# Patient Record
Sex: Male | Born: 1950 | Race: White | Hispanic: No | Marital: Married | Smoking: Former smoker
Health system: Southern US, Community
[De-identification: ages and names within clinical notes are randomized; demographics above are authoritative.]

## PROBLEM LIST (undated history)

## (undated) DIAGNOSIS — H53459 Other localized visual field defect, unspecified eye: Secondary | ICD-10-CM

## (undated) DIAGNOSIS — Z951 Presence of aortocoronary bypass graft: Secondary | ICD-10-CM

## (undated) DIAGNOSIS — I255 Ischemic cardiomyopathy: Secondary | ICD-10-CM

## (undated) DIAGNOSIS — I10 Essential (primary) hypertension: Secondary | ICD-10-CM

## (undated) DIAGNOSIS — I213 ST elevation (STEMI) myocardial infarction of unspecified site: Secondary | ICD-10-CM

## (undated) DIAGNOSIS — Z720 Tobacco use: Secondary | ICD-10-CM

## (undated) DIAGNOSIS — E785 Hyperlipidemia, unspecified: Secondary | ICD-10-CM

## (undated) DIAGNOSIS — K219 Gastro-esophageal reflux disease without esophagitis: Secondary | ICD-10-CM

## (undated) DIAGNOSIS — I5020 Unspecified systolic (congestive) heart failure: Secondary | ICD-10-CM

## (undated) DIAGNOSIS — I25110 Atherosclerotic heart disease of native coronary artery with unstable angina pectoris: Secondary | ICD-10-CM

## (undated) HISTORY — PX: ADENOIDECTOMY: SUR15

## (undated) HISTORY — DX: Gastro-esophageal reflux disease without esophagitis: K21.9

## (undated) HISTORY — PX: CARDIAC CATHETERIZATION: SHX172

## (undated) HISTORY — DX: Unspecified systolic (congestive) heart failure: I50.20

## (undated) HISTORY — DX: Other localized visual field defect, unspecified eye: H53.459

## (undated) HISTORY — PX: CATARACT EXTRACTION: SUR2

---

## 2007-10-22 ENCOUNTER — Ambulatory Visit: Payer: Self-pay | Admitting: Internal Medicine

## 2008-03-07 ENCOUNTER — Ambulatory Visit: Payer: Self-pay | Admitting: General Surgery

## 2013-02-26 ENCOUNTER — Encounter: Payer: Self-pay | Admitting: General Surgery

## 2013-02-26 ENCOUNTER — Ambulatory Visit (INDEPENDENT_AMBULATORY_CARE_PROVIDER_SITE_OTHER): Payer: BC Managed Care – PPO | Admitting: General Surgery

## 2013-02-26 VITALS — BP 140/72 | HR 82 | Resp 12 | Ht 72.0 in | Wt 192.0 lb

## 2013-02-26 DIAGNOSIS — Z860100 Personal history of colon polyps, unspecified: Secondary | ICD-10-CM

## 2013-02-26 MED ORDER — POLYETHYLENE GLYCOL 3350 17 GM/SCOOP PO POWD: ORAL | Status: DC

## 2013-02-26 NOTE — Progress Notes (Signed)
Patient ID: [name] [name], male   DOB: 10-27-1950, 62 y.o.   MRN: [id number]  Chief Complaint  Patient presents with  . Other    5 year colonoscopy    HPI [name] [name] is a 62 y.o. male.  Patient here today to discuss having a colonoscopy.  Last one done was 03-07-08. The patient has a history of colon polyps. He denies any problems with his bowels at this time. No known family history of colon problems.   HPI  Past Medical History  Diagnosis Date  . Hypertension   . High cholesterol   . GERD (gastroesophageal reflux disease)     Past Surgical History  Procedure Laterality Date  . Adenoidectomy      History reviewed. No pertinent family history.  Social History History  Substance Use Topics  . Smoking status: Current Every Day Smoker -- 1.00 packs/day    Types: Cigarettes  . Smokeless tobacco: Never Used  . Alcohol Use: Yes     Comment: 4/week    Not on File  Current Outpatient Prescriptions  Medication Sig Dispense Refill  . aspirin 81 MG tablet Take 81 mg by mouth daily.      Marland Kitchen diltiazem (TIAZAC) 420 MG 24 hr capsule Take 420 mg by mouth daily.      . Ergocalciferol (VITAMIN D2) 2000 UNITS TABS Take 1 tablet by mouth daily.      . OMEGA 3 1000 MG CAPS Take 2,000 mg by mouth daily.      . pravastatin (PRAVACHOL) 80 MG tablet Take 80 mg by mouth daily.      . polyethylene glycol powder (GLYCOLAX/MIRALAX) powder 255 grams one bottle for colonoscopy prep  255 g  0   No current facility-administered medications for this visit.    Review of Systems Review of Systems  Constitutional: Negative.   Respiratory: Negative.  Negative for chest tightness and shortness of breath.   Cardiovascular: Negative.  Negative for chest pain, palpitations and leg swelling.  Gastrointestinal: Negative.     Blood pressure 140/72, pulse 82, resp. rate 12, height 6' (1.829 m), weight 192 lb (87.091 kg).  Physical Exam Physical Exam  Constitutional: He is oriented to person, place, and  time. He appears well-developed and well-nourished.  Cardiovascular: Normal rate and regular rhythm.   Murmur heard.  Systolic murmur is present with a grade of 2/6  Pulmonary/Chest: Effort normal and breath sounds normal.  Neurological: He is alert and oriented to person, place, and time.  Skin: Skin is warm and dry.    Data Reviewed Colonoscopy report dated March 07, 2008 showed a single polyp in the rectum. This was tubular adenoma on pathologic review without evidence of high-grade dysplasia. This measured about 5 mm in diameter.  Assessment    Previous colonic polyps. Candidate for screening colonoscopy.     Plan    Procedure was reviewed with the patient. He is amenable to have a repeat exam.     Patient has been scheduled for a colonoscopy on 04-10-13 at [facility]. This patient has been asked to discontinue fish oil one week prior to procedure. It is okay for patient to continue 81 mg aspirin.   Earline Mayotte 02/27/2013, 8:57 PM

## 2013-02-26 NOTE — Patient Instructions (Addendum)
Patient to be scheduled for a colonoscopy.   Colonoscopy A colonoscopy is an exam to evaluate your entire colon. In this exam, your colon is cleansed. A long fiberoptic tube is inserted through your rectum and into your colon. The fiberoptic scope (endoscope) is a long bundle of enclosed and very flexible fibers. These fibers transmit light to the area examined and send images from that area to your caregiver. Discomfort is usually minimal. You may be given a drug to help you sleep (sedative) during or prior to the procedure. This exam helps to detect lumps (tumors), polyps, inflammation, and areas of bleeding. Your caregiver may also take a small piece of tissue (biopsy) that will be examined under a microscope. LET YOUR CAREGIVER KNOW ABOUT:   Allergies to food or medicine.  Medicines taken, including vitamins, herbs, eyedrops, over-the-counter medicines, and creams.  Use of steroids (by mouth or creams).  Previous problems with anesthetics or numbing medicines.  History of bleeding problems or blood clots.  Previous surgery.  Other health problems, including diabetes and kidney problems.  Possibility of pregnancy, if this applies. BEFORE THE PROCEDURE   A clear liquid diet may be required for 2 days before the exam.  Ask your caregiver about changing or stopping your regular medications.  Liquid injections (enemas) or laxatives may be required.  A large amount of electrolyte solution may be given to you to drink over a short period of time. This solution is used to clean out your colon.  You should be present 60 minutes prior to your procedure or as directed by your caregiver. AFTER THE PROCEDURE   If you received a sedative or pain relieving medication, you will need to arrange for someone to drive you home.  Occasionally, there is a little blood passed with the first bowel movement. Do not be concerned. FINDING OUT THE RESULTS OF YOUR TEST Not all test results are available  during your visit. If your test results are not back during the visit, make an appointment with your caregiver to find out the results. Do not assume everything is normal if you have not heard from your caregiver or the medical facility. It is important for you to follow up on all of your test results. HOME CARE INSTRUCTIONS   It is not unusual to pass moderate amounts of gas and experience mild abdominal cramping following the procedure. This is due to air being used to inflate your colon during the exam. Walking or a warm pack on your belly (abdomen) may help.  You may resume all normal meals and activities after sedatives and medicines have worn off.  Only take over-the-counter or prescription medicines for pain, discomfort, or fever as directed by your caregiver. Do not use aspirin or blood thinners if a biopsy was taken. Consult your caregiver for medicine usage if biopsies were taken. SEEK IMMEDIATE MEDICAL CARE IF:   You have a fever.  You pass large blood clots or fill a toilet with blood following the procedure. This may also occur 10 to 14 days following the procedure. This is more likely if a biopsy was taken.  You develop abdominal pain that keeps getting worse and cannot be relieved with medicine. Document Released: 06/24/2000 Document Revised: 09/19/2011 Document Reviewed: 02/07/2008 [facility] Patient Information 2014 [place], [place].  Patient has been scheduled for a colonoscopy on 04-10-13 at [facility]. This patient has been asked to discontinue fish oil one week prior to procedure. It is okay for patient to continue 81 mg aspirin.

## 2013-02-27 ENCOUNTER — Encounter: Payer: Self-pay | Admitting: General Surgery

## 2013-03-24 ENCOUNTER — Other Ambulatory Visit: Payer: Self-pay | Admitting: General Surgery

## 2013-03-24 DIAGNOSIS — Z86010 Personal history of colonic polyps: Secondary | ICD-10-CM

## 2013-04-03 ENCOUNTER — Telehealth: Payer: Self-pay | Admitting: *Deleted

## 2013-04-03 NOTE — Telephone Encounter (Signed)
Patient reports no change in medications since last office visit. He states he has already stopped fish oil. Patient instructed to pre-register since he has not done so already. We will proceed with colonoscopy that is scheduled for 04-10-13 at [facility]. He will call the office if he has further questions.

## 2013-04-10 ENCOUNTER — Ambulatory Visit: Payer: Self-pay | Admitting: General Surgery

## 2013-04-10 DIAGNOSIS — D129 Benign neoplasm of anus and anal canal: Secondary | ICD-10-CM

## 2013-04-10 DIAGNOSIS — D128 Benign neoplasm of rectum: Secondary | ICD-10-CM

## 2013-04-11 ENCOUNTER — Encounter: Payer: Self-pay | Admitting: General Surgery

## 2013-04-11 LAB — PATHOLOGY REPORT

## 2013-04-12 ENCOUNTER — Telehealth: Payer: Self-pay | Admitting: *Deleted

## 2013-04-12 ENCOUNTER — Encounter: Payer: Self-pay | Admitting: General Surgery

## 2013-04-12 NOTE — Telephone Encounter (Signed)
Message copied by Levada Schilling on Fri Apr 12, 2013  9:09 AM ------      Message from: [place], Merrily Pew      Created: Fri Apr 12, 2013  8:54 AM       Notify the patient the polyps removed on Oct 1 were benign, no cancer. He should plan on a f/u colonoscopy in five years.            Put in recalls, or forward to [place]. Thanks. ------

## 2013-04-12 NOTE — Telephone Encounter (Signed)
Notified patient as instructed, patient pleased. Discussed follow-up appointments,put in recalls for five years. patient agrees

## 2014-03-24 ENCOUNTER — Inpatient Hospital Stay: Payer: Self-pay | Admitting: Internal Medicine

## 2014-03-24 LAB — COMPREHENSIVE METABOLIC PANEL
ALBUMIN: 3.6 g/dL (ref 3.4–5.0)
ALT: 27 U/L
ANION GAP: 9 (ref 7–16)
Alkaline Phosphatase: 62 U/L
BILIRUBIN TOTAL: 0.4 mg/dL (ref 0.2–1.0)
BUN: 12 mg/dL (ref 7–18)
CALCIUM: 8.3 mg/dL — AB (ref 8.5–10.1)
CREATININE: 1.3 mg/dL (ref 0.60–1.30)
Chloride: 107 mmol/L (ref 98–107)
Co2: 23 mmol/L (ref 21–32)
EGFR (African American): >60
GFR CALC NON AF AMER: 58 — AB
Glucose: 110 mg/dL — ABNORMAL HIGH (ref 65–99)
OSMOLALITY: 278 (ref 275–301)
POTASSIUM: 4.1 mmol/L (ref 3.5–5.1)
SGOT(AST): 108 U/L — ABNORMAL HIGH (ref 15–37)
Sodium: 139 mmol/L (ref 136–145)
Total Protein: 7.3 g/dL (ref 6.4–8.2)

## 2014-03-24 LAB — TROPONIN I
TROPONIN-I: 34 ng/mL — AB
Troponin-I: 12 ng/mL — ABNORMAL HIGH

## 2014-03-24 LAB — PRO B NATRIURETIC PEPTIDE: B-Type Natriuretic Peptide: 721 pg/mL — ABNORMAL HIGH (ref 0–125)

## 2014-03-24 LAB — URINALYSIS, COMPLETE
Bacteria: NONE SEEN
Bilirubin,UR: NEGATIVE
Blood: NEGATIVE
Glucose,UR: NEGATIVE mg/dL (ref 0–75)
KETONE: NEGATIVE
Leukocyte Esterase: NEGATIVE
Nitrite: NEGATIVE
Ph: 5 (ref 4.5–8.0)
Protein: NEGATIVE
Specific Gravity: 1.003 (ref 1.003–1.030)

## 2014-03-24 LAB — APTT: ACTIVATED PTT: 30.2 s (ref 23.6–35.9)

## 2014-03-24 LAB — CBC
HCT: 44.2 % (ref 40.0–52.0)
HGB: 14.8 g/dL (ref 13.0–18.0)
MCH: 31.1 pg (ref 26.0–34.0)
MCHC: 33.6 g/dL (ref 32.0–36.0)
MCV: 93 fL (ref 80–100)
Platelet: 229 10*3/uL (ref 150–440)
RBC: 4.77 10*6/uL (ref 4.40–5.90)
RDW: 13.4 % (ref 11.5–14.5)
WBC: 10.9 10*3/uL — ABNORMAL HIGH (ref 3.8–10.6)

## 2014-03-24 LAB — CK TOTAL AND CKMB (NOT AT ARMC)
CK, TOTAL: 801 U/L — AB
CK-MB: 69.3 ng/mL — ABNORMAL HIGH (ref 0.5–3.6)

## 2014-03-24 LAB — HEPARIN LEVEL (UNFRACTIONATED): Anti-Xa(Unfractionated): 0.38 IU/mL (ref 0.30–0.70)

## 2014-03-24 LAB — PROTIME-INR
INR: 1.1
PROTHROMBIN TIME: 14.3 s (ref 11.5–14.7)

## 2014-03-24 LAB — CK-MB
CK-MB: 130 ng/mL — ABNORMAL HIGH (ref 0.5–3.6)
CK-MB: 153.2 ng/mL — ABNORMAL HIGH (ref 0.5–3.6)

## 2014-03-25 DIAGNOSIS — I2510 Atherosclerotic heart disease of native coronary artery without angina pectoris: Secondary | ICD-10-CM

## 2014-03-25 DIAGNOSIS — R079 Chest pain, unspecified: Secondary | ICD-10-CM

## 2014-03-25 DIAGNOSIS — I200 Unstable angina: Secondary | ICD-10-CM | POA: Diagnosis not present

## 2014-03-25 DIAGNOSIS — I10 Essential (primary) hypertension: Secondary | ICD-10-CM

## 2014-03-25 DIAGNOSIS — I214 Non-ST elevation (NSTEMI) myocardial infarction: Secondary | ICD-10-CM | POA: Diagnosis not present

## 2014-03-25 LAB — CBC WITH DIFFERENTIAL/PLATELET
Basophil #: 0 10*3/uL (ref 0.0–0.1)
Basophil %: 0.3 %
EOS ABS: 0.1 10*3/uL (ref 0.0–0.7)
Eosinophil %: 0.6 %
HCT: 41.7 % (ref 40.0–52.0)
HGB: 14 g/dL (ref 13.0–18.0)
LYMPHS ABS: 2.2 10*3/uL (ref 1.0–3.6)
Lymphocyte %: 15.7 %
MCH: 31.1 pg (ref 26.0–34.0)
MCHC: 33.6 g/dL (ref 32.0–36.0)
MCV: 93 fL (ref 80–100)
MONO ABS: 1.4 x10 3/mm — AB (ref 0.2–1.0)
MONOS PCT: 10.2 %
NEUTROS PCT: 73.2 %
Neutrophil #: 10 10*3/uL — ABNORMAL HIGH (ref 1.4–6.5)
Platelet: 205 10*3/uL (ref 150–440)
RBC: 4.51 10*6/uL (ref 4.40–5.90)
RDW: 13.6 % (ref 11.5–14.5)
WBC: 13.7 10*3/uL — ABNORMAL HIGH (ref 3.8–10.6)

## 2014-03-25 LAB — BASIC METABOLIC PANEL
Anion Gap: 8 (ref 7–16)
BUN: 10 mg/dL (ref 7–18)
CHLORIDE: 107 mmol/L (ref 98–107)
CO2: 25 mmol/L (ref 21–32)
CREATININE: 0.87 mg/dL (ref 0.60–1.30)
Calcium, Total: 8.4 mg/dL — ABNORMAL LOW (ref 8.5–10.1)
EGFR (African American): >60
EGFR (Non-African Amer.): >60
Glucose: 116 mg/dL — ABNORMAL HIGH (ref 65–99)
OSMOLALITY: 279 (ref 275–301)
POTASSIUM: 4 mmol/L (ref 3.5–5.1)
Sodium: 140 mmol/L (ref 136–145)

## 2014-03-25 LAB — LIPID PANEL
CHOLESTEROL: 131 mg/dL (ref 0–200)
HDL Cholesterol: 42 mg/dL (ref 40–60)
LDL CHOLESTEROL, CALC: 77 mg/dL (ref 0–100)
TRIGLYCERIDES: 61 mg/dL (ref 0–200)
VLDL CHOLESTEROL, CALC: 12 mg/dL (ref 5–40)

## 2014-03-25 LAB — HEMOGLOBIN A1C: Hemoglobin A1C: 5.8 % (ref 4.2–6.3)

## 2014-03-25 LAB — HEPARIN LEVEL (UNFRACTIONATED)
ANTI-XA(UNFRACTIONATED): 0.45 [IU]/mL (ref 0.30–0.70)
Anti-Xa(Unfractionated): 0.2 IU/mL — ABNORMAL LOW (ref 0.30–0.70)

## 2014-03-25 LAB — TSH: Thyroid Stimulating Horm: 1.18 u[IU]/mL

## 2014-03-25 LAB — TROPONIN I: Troponin-I: >40 ng/mL

## 2014-03-26 ENCOUNTER — Other Ambulatory Visit: Payer: Self-pay | Admitting: Nurse Practitioner

## 2014-03-26 ENCOUNTER — Inpatient Hospital Stay (HOSPITAL_COMMUNITY)
Admission: EM | Admit: 2014-03-26 | Discharge: 2014-04-02 | DRG: 236 | Disposition: A | Discharge status: Home / Self Care | Payer: BC Managed Care – PPO | Source: Other Acute Inpatient Hospital | Attending: Thoracic Surgery (Cardiothoracic Vascular Surgery) | Admitting: Thoracic Surgery (Cardiothoracic Vascular Surgery)

## 2014-03-26 ENCOUNTER — Encounter: Payer: Self-pay | Admitting: Nurse Practitioner

## 2014-03-26 DIAGNOSIS — I25110 Atherosclerotic heart disease of native coronary artery with unstable angina pectoris: Secondary | ICD-10-CM | POA: Diagnosis present

## 2014-03-26 DIAGNOSIS — K219 Gastro-esophageal reflux disease without esophagitis: Secondary | ICD-10-CM | POA: Diagnosis present

## 2014-03-26 DIAGNOSIS — I2510 Atherosclerotic heart disease of native coronary artery without angina pectoris: Secondary | ICD-10-CM

## 2014-03-26 DIAGNOSIS — J4489 Other specified chronic obstructive pulmonary disease: Secondary | ICD-10-CM | POA: Diagnosis present

## 2014-03-26 DIAGNOSIS — Z951 Presence of aortocoronary bypass graft: Secondary | ICD-10-CM

## 2014-03-26 DIAGNOSIS — I10 Essential (primary) hypertension: Secondary | ICD-10-CM | POA: Diagnosis present

## 2014-03-26 DIAGNOSIS — D62 Acute posthemorrhagic anemia: Secondary | ICD-10-CM | POA: Diagnosis not present

## 2014-03-26 DIAGNOSIS — I255 Ischemic cardiomyopathy: Secondary | ICD-10-CM

## 2014-03-26 DIAGNOSIS — J449 Chronic obstructive pulmonary disease, unspecified: Secondary | ICD-10-CM | POA: Diagnosis present

## 2014-03-26 DIAGNOSIS — R443 Hallucinations, unspecified: Secondary | ICD-10-CM | POA: Diagnosis not present

## 2014-03-26 DIAGNOSIS — R079 Chest pain, unspecified: Secondary | ICD-10-CM | POA: Diagnosis present

## 2014-03-26 DIAGNOSIS — F17200 Nicotine dependence, unspecified, uncomplicated: Secondary | ICD-10-CM | POA: Diagnosis present

## 2014-03-26 DIAGNOSIS — R7309 Other abnormal glucose: Secondary | ICD-10-CM | POA: Diagnosis present

## 2014-03-26 DIAGNOSIS — I214 Non-ST elevation (NSTEMI) myocardial infarction: Secondary | ICD-10-CM | POA: Insufficient documentation

## 2014-03-26 DIAGNOSIS — I200 Unstable angina: Secondary | ICD-10-CM

## 2014-03-26 DIAGNOSIS — E8779 Other fluid overload: Secondary | ICD-10-CM | POA: Diagnosis not present

## 2014-03-26 DIAGNOSIS — J9819 Other pulmonary collapse: Secondary | ICD-10-CM | POA: Diagnosis not present

## 2014-03-26 DIAGNOSIS — E785 Hyperlipidemia, unspecified: Secondary | ICD-10-CM | POA: Diagnosis present

## 2014-03-26 DIAGNOSIS — Z720 Tobacco use: Secondary | ICD-10-CM

## 2014-03-26 DIAGNOSIS — I2589 Other forms of chronic ischemic heart disease: Secondary | ICD-10-CM | POA: Diagnosis present

## 2014-03-26 DIAGNOSIS — I517 Cardiomegaly: Secondary | ICD-10-CM

## 2014-03-26 HISTORY — DX: Presence of aortocoronary bypass graft: Z95.1

## 2014-03-26 HISTORY — DX: Hyperlipidemia, unspecified: E78.5

## 2014-03-26 HISTORY — DX: Essential (primary) hypertension: I10

## 2014-03-26 HISTORY — DX: Ischemic cardiomyopathy: I25.5

## 2014-03-26 HISTORY — DX: ST elevation (STEMI) myocardial infarction of unspecified site: I21.3

## 2014-03-26 HISTORY — DX: Tobacco use: Z72.0

## 2014-03-26 HISTORY — DX: Atherosclerotic heart disease of native coronary artery with unstable angina pectoris: I25.110

## 2014-03-26 LAB — BLOOD GAS, ARTERIAL
Acid-base deficit: 0.7 mmol/L (ref 0.0–2.0)
BICARBONATE: 22.6 meq/L (ref 20.0–24.0)
Drawn by: 22563
FIO2: 0.21 %
O2 Saturation: 95.6 %
PCO2 ART: 32 mmHg — AB (ref 35.0–45.0)
PH ART: 7.463 — AB (ref 7.350–7.450)
Patient temperature: 98.6
TCO2: 23.6 mmol/L (ref 0–100)
pO2, Arterial: 73.5 mmHg — ABNORMAL LOW (ref 80.0–100.0)

## 2014-03-26 LAB — CBC WITH DIFFERENTIAL/PLATELET
Basophil #: 0 10*3/uL (ref 0.0–0.1)
Basophil %: 0.2 %
EOS PCT: 0.1 %
Eosinophil #: 0 10*3/uL (ref 0.0–0.7)
HCT: 40.5 % (ref 40.0–52.0)
HGB: 13.8 g/dL (ref 13.0–18.0)
LYMPHS ABS: 2.2 10*3/uL (ref 1.0–3.6)
Lymphocyte %: 15 %
MCH: 31.4 pg (ref 26.0–34.0)
MCHC: 34.2 g/dL (ref 32.0–36.0)
MCV: 92 fL (ref 80–100)
Monocyte #: 2.1 x10 3/mm — ABNORMAL HIGH (ref 0.2–1.0)
Monocyte %: 14.5 %
Neutrophil #: 10.2 10*3/uL — ABNORMAL HIGH (ref 1.4–6.5)
Neutrophil %: 70.2 %
PLATELETS: 201 10*3/uL (ref 150–440)
RBC: 4.41 10*6/uL (ref 4.40–5.90)
RDW: 13.6 % (ref 11.5–14.5)
WBC: 14.6 10*3/uL — AB (ref 3.8–10.6)

## 2014-03-26 LAB — CBC
HCT: 39.5 % (ref 39.0–52.0)
HEMOGLOBIN: 13.7 g/dL (ref 13.0–17.0)
MCH: 31.1 pg (ref 26.0–34.0)
MCHC: 34.7 g/dL (ref 30.0–36.0)
MCV: 89.6 fL (ref 78.0–100.0)
Platelets: 175 10*3/uL (ref 150–400)
RBC: 4.41 MIL/uL (ref 4.22–5.81)
RDW: 13.6 % (ref 11.5–15.5)
WBC: 12.4 10*3/uL — ABNORMAL HIGH (ref 4.0–10.5)

## 2014-03-26 LAB — COMPREHENSIVE METABOLIC PANEL
ALBUMIN: 3 g/dL — AB (ref 3.5–5.2)
ALK PHOS: 49 U/L (ref 39–117)
ALT: 30 U/L (ref 0–53)
ANION GAP: 14 (ref 5–15)
AST: 93 U/L — ABNORMAL HIGH (ref 0–37)
BILIRUBIN TOTAL: 0.6 mg/dL (ref 0.3–1.2)
BUN: 12 mg/dL (ref 6–23)
CHLORIDE: 98 meq/L (ref 96–112)
CO2: 20 meq/L (ref 19–32)
Calcium: 8.5 mg/dL (ref 8.4–10.5)
Creatinine, Ser: 0.77 mg/dL (ref 0.50–1.35)
GFR calc non Af Amer: >90 mL/min (ref >90)
Glucose, Bld: 224 mg/dL — ABNORMAL HIGH (ref 70–99)
POTASSIUM: 4 meq/L (ref 3.7–5.3)
Sodium: 132 mEq/L — ABNORMAL LOW (ref 137–147)
Total Protein: 6.3 g/dL (ref 6.0–8.3)

## 2014-03-26 LAB — URINALYSIS, ROUTINE W REFLEX MICROSCOPIC
BILIRUBIN URINE: NEGATIVE
Glucose, UA: NEGATIVE mg/dL
Ketones, ur: NEGATIVE mg/dL
Leukocytes, UA: NEGATIVE
NITRITE: NEGATIVE
PH: 6 (ref 5.0–8.0)
Protein, ur: NEGATIVE mg/dL
SPECIFIC GRAVITY, URINE: 1.007 (ref 1.005–1.030)
Urobilinogen, UA: 1 mg/dL (ref 0.0–1.0)

## 2014-03-26 LAB — HEPARIN LEVEL (UNFRACTIONATED)
Anti-Xa(Unfractionated): 0.18 IU/mL — ABNORMAL LOW (ref 0.30–0.70)
Anti-Xa(Unfractionated): 0.41 IU/mL (ref 0.30–0.70)
Heparin Unfractionated: 0.28 IU/mL — ABNORMAL LOW (ref 0.30–0.70)

## 2014-03-26 LAB — ABO/RH: ABO/RH(D): A POS

## 2014-03-26 LAB — URINE MICROSCOPIC-ADD ON

## 2014-03-26 LAB — TYPE AND SCREEN
ABO/RH(D): A POS
ANTIBODY SCREEN: NEGATIVE

## 2014-03-26 LAB — APTT: aPTT: 77 seconds — ABNORMAL HIGH (ref 24–37)

## 2014-03-26 LAB — MRSA PCR SCREENING: MRSA by PCR: NEGATIVE

## 2014-03-26 LAB — PROTIME-INR
INR: 1.26 (ref 0.00–1.49)
Prothrombin Time: 15.8 seconds — ABNORMAL HIGH (ref 11.6–15.2)

## 2014-03-26 MED ORDER — NITROGLYCERIN 2 % TD OINT
0.5000 [in_us] | TOPICAL_OINTMENT | Freq: Four times a day (QID) | TRANSDERMAL | Status: DC
  Administered 2014-03-26 – 2014-03-28 (×6): 0.5 [in_us] via TOPICAL
  Filled 2014-03-26: qty 30

## 2014-03-26 MED ORDER — SODIUM CHLORIDE 0.9 % IV SOLN: 250.0000 mL | INTRAVENOUS | Status: DC | PRN

## 2014-03-26 MED ORDER — NITROGLYCERIN 0.4 MG SL SUBL: 0.4000 mg | SUBLINGUAL_TABLET | SUBLINGUAL | Status: DC | PRN

## 2014-03-26 MED ORDER — CARVEDILOL 3.125 MG PO TABS
3.1250 mg | ORAL_TABLET | Freq: Two times a day (BID) | ORAL | Status: DC
  Administered 2014-03-26 – 2014-03-27 (×3): 3.125 mg via ORAL
  Filled 2014-03-26 (×6): qty 1

## 2014-03-26 MED ORDER — ASPIRIN EC 81 MG PO TBEC
81.0000 mg | DELAYED_RELEASE_TABLET | Freq: Every day | ORAL | Status: DC
  Administered 2014-03-27: 81 mg via ORAL
  Filled 2014-03-26 (×2): qty 1

## 2014-03-26 MED ORDER — SODIUM CHLORIDE 0.9 % IJ SOLN
3.0000 mL | INTRAMUSCULAR | Status: DC | PRN
Start: 2014-03-26 — End: 2014-03-28

## 2014-03-26 MED ORDER — ATORVASTATIN CALCIUM 80 MG PO TABS
80.0000 mg | ORAL_TABLET | Freq: Every day | ORAL | Status: DC
  Administered 2014-03-26 – 2014-03-27 (×2): 80 mg via ORAL
  Filled 2014-03-26 (×3): qty 1

## 2014-03-26 MED ORDER — ONDANSETRON HCL 4 MG/2ML IJ SOLN: 4.0000 mg | Freq: Four times a day (QID) | INTRAMUSCULAR | Status: DC | PRN

## 2014-03-26 MED ORDER — HEPARIN (PORCINE) IN NACL 100-0.45 UNIT/ML-% IJ SOLN
1700.0000 [IU]/h | INTRAMUSCULAR | Status: AC
  Administered 2014-03-27: 1700 [IU]/h via INTRAVENOUS
  Filled 2014-03-26 (×3): qty 250

## 2014-03-26 MED ORDER — SODIUM CHLORIDE 0.9 % IJ SOLN
3.0000 mL | Freq: Two times a day (BID) | INTRAMUSCULAR | Status: DC
  Administered 2014-03-27 (×2): 3 mL via INTRAVENOUS

## 2014-03-26 MED ORDER — ACETAMINOPHEN 325 MG PO TABS: 650.0000 mg | ORAL_TABLET | ORAL | Status: DC | PRN

## 2014-03-26 NOTE — Progress Notes (Signed)
TCTS BRIEF PROGRESS NOTE   Patient seen and examined, chart and cath films reviewed. We tentatively plan CABG 1st case Friday 9/18 Full note to follow  Rexene Alberts 03/26/2014 8:29 PM

## 2014-03-26 NOTE — Progress Notes (Addendum)
ANTICOAGULATION CONSULT NOTE - Initial Consult  Pharmacy Consult for heparin Indication: chest pain/ACS  No Known Allergies  Patient Measurements:   Vital Signs:    Labs: No results found for this basename: HGB, HCT, PLT, APTT, LABPROT, INR, HEPARINUNFRC, CREATININE, CKTOTAL, CKMB, TROPONINI,  in the last 72 hours  CrCl is unknown because no creatinine reading has been taken.   Medical History: Past Medical History  Diagnosis Date  . Hypertension   . High cholesterol   . GERD (gastroesophageal reflux disease)   . CAD (coronary artery disease)     a. 03/2014 NSTEMI/Cath: LM nl, LAD 56m (FFR 0.76), D1 134m, LCX 15m, RCA 154m, EF 35%  . Ischemic cardiomyopathy     a. 03/2014 EF 35% by LV gram.  . Tobacco abuse     Medications:  See Emr  Assessment: 63 year old male noted with cp overweekend, elevated ce's at md's office. Taken for cath 9/15 and found to have multivessel cad, patient transferred to [facility] for [facility]. Patient will continue on current heparin infusion at 1600 units/hr, cbc has been stable, will check level tonight.  Goal of Therapy:  Heparin level 0.3-0.7 units/ml Monitor platelets by anticoagulation protocol: Yes   Plan:  Continue heparin at 1600 units/hr Check hl at 2000 tonight then daily  Erin Hearing PharmD., BCPS Clinical Pharmacist Pager [phone] 03/26/2014 5:12 PM   Addendum:  Follow up heparin level this evening was just below goal at 0.28. No bleeding issues noted. Will increase rate slightly to 1700 units/hr and check HL in am.  Noted [facility] tentatively scheduled for Friday 9/18.  03/26/2014 9:50 PM

## 2014-03-26 NOTE — Consult Note (Signed)
[address]       [place] [address]             [phone]          CARDIOTHORACIC SURGERY CONSULTATION REPORT  PCP is Albina Billet, MD Referring Provider is Ida Rogue, MD   Reason for consultation:  Severe 3-vessel CAD s/p acute non-STEMI  HPI:  Patient is a 63 year old white male with no previous history of coronary artery disease but risk factors notable for history of hypertension, hyperlipidemia, and long-standing tobacco abuse. The patient describes a one year history of progressive symptoms of exertional fatigue and subsequent chest discomfort.  Symptoms of chest discomfort are described as dull, sometimes burning substernal chest pain that frequently occurs after eating a large meal and also occurs with strenuous physical activity. Symptoms have been associated with decreased energy and progressive exertional fatigue without shortness of breath. Approximately one week ago he developed more severe substernal chest discomfort associated with diaphoresis while he was playing golf. Symptoms eventually subsided after 40 minutes.  Several days ago he developed similar chest discomfort that awoke him from his sleep in the morning. Symptoms persisted all day, ultimately causing the patient to seek evaluation in his primary care physician's office. Electrocardiogram did not reveal acute ischemic changes, but serum troponin level was elevated at 12. The patient was sent to the emergency department at [facility] where he was admitted to the hospital and subsequently ruled in for acute non-ST segment elevation myocardial infarction.  Symptoms of chest discomfort resolved with nitrates and intravenous heparin. He was evaluated by Dr. Rockey Situ and underwent diagnostic heart catheterization 03/25/2014. This revealed severe three-vessel coronary artery disease with severe left ventricular dysfunction.  The patient was transferred to [facility] for surgical evaluation.  The patient is  married and lives with his wife in [place].  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 He plays golf frequently. Prior to this illness the patient reports no significant physical limitations, although he does report decreased activity over the past year in association with progressive symptoms of fatigue. He denies any symptoms of significant exertional shortness of breath, resting shortness of breath, PND, orthopnea, or lower extremity edema. He has had occasional palpitations without dizzy spells or syncope. He has a long-standing history of tobacco abuse and was smoking between one third and one half packs of cigarettes per day immediately prior to admission. He drinks one or 2 alcoholic beverages every night but he denies any history of excessive alcohol consumption.  Past Medical History  Diagnosis Date  . Acute non-Q wave ST elevation myocardial infarction (STEMI) 9/13-14/2015  . Atherosclerotic heart disease of native coronary artery with unstable angina pectoris     a. 03/2014 NSTEMI/Cath: LM nl, LAD 88m (FFR 0.76), D1 163m, LCX 128m, RCA 164m, EF 35%  . Ischemic cardiomyopathy     a. 03/2014 EF 35% by LV gram.  . Essential hypertension   . Hyperlipidemia with target LDL less than 70   . Tobacco abuse   . GERD (gastroesophageal reflux disease)     Past Surgical History  Procedure Laterality Date  . Adenoidectomy      History reviewed. No pertinent family history.  History   Social History  . Marital Status: Married    Spouse Name: N/A    Number of Children: N/A  . Years of Education: N/A   Occupational History  . Not on file.   Social History Main Topics  .  Smoking status: Current Every Day Smoker -- 1.00 packs/day    Types: Cigarettes  . Smokeless tobacco: Never Used  . Alcohol Use: Yes     Comment: 4/week  . Drug Use: No  . Sexual Activity: Not on file   Other Topics Concern  . Not on file   Social History Narrative  . No narrative on file    Prior  to Admission medications   Medication Sig Start Date End Date Taking? Authorizing Provider  Multiple Vitamins-Minerals (MULTIVITAMIN WITH MINERALS) tablet Take 1 tablet by mouth daily.   Yes Historical Provider, MD  pravastatin (PRAVACHOL) 40 MG tablet Take 40 mg by mouth at bedtime.   Yes Historical Provider, MD  diltiazem (TIAZAC) 420 MG 24 hr capsule Take 420 mg by mouth daily.    Historical Provider, MD  OMEGA 3 1000 MG CAPS Take 2,000 mg by mouth daily.    Historical Provider, MD    Current Facility-Administered Medications  Medication Dose Route Frequency Provider Last Rate Last Dose  . 0.9 %  sodium chloride infusion  250 mL Intravenous PRN Rogelia Mire, NP      . acetaminophen (TYLENOL) tablet 650 mg  650 mg Oral Q4H PRN Rogelia Mire, NP      . Derrill Memo ON 03/27/2014] aspirin EC tablet 81 mg  81 mg Oral Daily Rogelia Mire, NP      . atorvastatin (LIPITOR) tablet 80 mg  80 mg Oral q1800 Rogelia Mire, NP   80 mg at 03/26/14 1818  . carvedilol (COREG) tablet 3.125 mg  3.125 mg Oral BID WC Rogelia Mire, NP   3.125 mg at 03/26/14 1818  . heparin ADULT infusion 100 units/mL (25000 units/250 mL)  1,600 Units/hr Intravenous Continuous Georgina Peer, [facility] 16 mL/hr at 03/26/14 1715 1,600 Units/hr at 03/26/14 1715  . nitroGLYCERIN (NITROGLYN) 2 % ointment 0.5 inch  0.5 inch Topical 4 times per day Rogelia Mire, NP   0.5 inch at 03/26/14 1819  . nitroGLYCERIN (NITROSTAT) SL tablet 0.4 mg  0.4 mg Sublingual Q5 Min x 3 PRN Rogelia Mire, NP      . ondansetron (ZOFRAN) injection 4 mg  4 mg Intravenous Q6H PRN Rogelia Mire, NP      . sodium chloride 0.9 % injection 3 mL  3 mL Intravenous Q12H Rogelia Mire, NP      . sodium chloride 0.9 % injection 3 mL  3 mL Intravenous PRN Rogelia Mire, NP        No Known Allergies    Review of Systems:   General:  normal appetite, decreased energy, no weight gain, no weight loss, no  fever  Cardiac:  + chest pain with exertion, + chest pain at rest, no SOB with exertion, no resting SOB, no PND, no orthopnea, + palpitations, no arrhythmia, no atrial fibrillation, no LE edema, no dizzy spells, no syncope  Respiratory:  no shortness of breath, no home oxygen, no productive cough, occassional dry cough, no bronchitis, no wheezing, no hemoptysis, no asthma, no pain with inspiration or cough, no sleep apnea, no CPAP at night  GI:   no difficulty swallowing, no reflux, no frequent heartburn, no hiatal hernia, no abdominal pain, no constipation, no diarrhea, no hematochezia, no hematemesis, no melena  GU:   no dysuria,  no frequency, no urinary tract infection, no hematuria, mildly enlarged prostate, no kidney stones, no kidney disease  Vascular:  no pain suggestive of claudication, no  pain in feet, no leg cramps, no varicose veins, no DVT, no non-healing foot ulcer  Neuro:   no stroke, no TIA's, no seizures, no headaches, no temporary blindness one eye,  no slurred speech, no peripheral neuropathy, no chronic pain, no instability of gait, no memory/cognitive dysfunction  Musculoskeletal: no arthritis, no joint swelling, no myalgias, no difficulty walking, normal mobility   Skin:   no rash, no itching, no skin infections, no pressure sores or ulcerations  Psych:   no anxiety, no depression, no nervousness, no unusual recent stress  Eyes:   no blurry vision, no floaters, no recent vision changes, + wears glasses but only for reading  ENT:   no hearing loss, no loose or painful teeth, no dentures, last saw dentist within the past year  Hematologic:  no easy bruising, no abnormal bleeding, no clotting disorder, no frequent epistaxis  Endocrine:  no diabetes, does not check CBG's at home     Physical Exam:   BP 104/51  Pulse 75  Temp(Src) 99 F (37.2 C) (Oral)  Resp 23  Ht 6' (1.829 m)  Wt 86.183 kg (190 lb)  BMI 25.76 kg/m2  SpO2 99%  General:     well-appearing  HEENT:  Unremarkable   Neck:   no JVD, no bruits, no adenopathy   Chest:   clear to auscultation, symmetrical breath sounds, no wheezes, no rhonchi   CV:   RRR, no murmur   Abdomen:  soft, non-tender, no masses   Extremities:  warm, well-perfused, pulses diminished, no lower extremity edema  Rectal/GU  Deferred  Neuro:   Grossly non-focal and symmetrical throughout  Skin:   Clean and dry, no rashes, no breakdown  Diagnostic Tests:  CARDIAC CATHETERIZATION  Images from diagnostic cardiac catheterization performed 03/25/2014 at [facility] are reviewed. The patient has severe three-vessel coronary artery disease with severe left ventricular dysfunction. Specifically, there is long segment 70% fusiform stenosis of the proximal left anterior descending coronary artery. Flow limitation in this vessel was confirmed using FFR.  There is 100% chronic occlusion of a large diagonal branch. Terminal portions of this vessel may or may not be graftable. There is 50-60% stenosis and left circumflex coronary artery and 100% chronic occlusion of a small OM branch.  There is 100% chronic occlusion of the right coronary artery with left to right collateral filling of terminal branches of the right coronary system.  Left ventricular ejection fraction was estimated 35%. Right heart catheterization was not performed.    Transthoracic Echocardiography  Patient: [name] [name], [name] [name] MR #: [id number] Study Date: 03/26/2014 Gender: M Age: 26 Height: 182.9 cm Weight: 86.6 kg BSA: 2.11 m^2 Pt. Status: Room: 2S09C  Andrena Mews, M.D. REFERRING Darylene Price, M.D. [place], MD [place], MD SONOGRAPHER Mauricio Po, RDCS, CCT PERFORMING [facility], Inpatient  cc:  ------------------------------------------------------------------- LV EF: 30% - 35%  ------------------------------------------------------------------- History: PMH: Ischemic Cardiomyopathy. Risk  factors: Status post recent catheterization in [place].  ------------------------------------------------------------------- Study Conclusions  - Left ventricle: The cavity size was mildly dilated. Wall thickness was increased in a pattern of mild LVH. Systolic function was moderately to severely reduced. The estimated ejection fraction was in the range of 30% to 35%. Mid anteroseptal/anterolateral/inferolateral akinesis, basal anterior/septal/lateral akinesis, akinesis of the apex. Doppler parameters are consistent with abnormal left ventricular relaxation (grade 1 diastolic dysfunction). - Aortic valve: There was no stenosis. There was trivial regurgitation. - Mitral valve: Mildly calcified annulus. There was trivial regurgitation. - Right ventricle: The cavity  size was normal. Systolic function was normal. - Pulmonary arteries: No complete TR doppler jet so unable to estimate PA systolic pressure. - Systemic veins: IVC measured 2.3 cm with > 50% respirophasic variation, suggesting RA pressure 8 mmHg. - Pericardium, extracardiac: A trivial pericardial effusion was identified.  Impressions:  - Normal LV size with mild LV hypertrophy. EF 30-35% with wall motion abnormalities as noted above, suggestive of LAD-territory infarction. Normal RV size and systolic function. No significant valvular abnormalities.  Transthoracic echocardiography. M-mode, complete 2D, spectral Doppler, and color Doppler. Birthdate: Patient birthdate: [birth year]. Age: Patient is 63 yr old. Sex: Gender: male. BMI: 25.9 kg/m^2. Blood pressure: 100/59 Patient status: Inpatient. Study date: Study date: 03/26/2014. Study time: 04:58 PM. Location: ICU/CCU  -------------------------------------------------------------------  ------------------------------------------------------------------- Left ventricle: The cavity size was mildly dilated. Wall thickness was increased in a pattern of mild LVH.  Systolic function was moderately to severely reduced. The estimated ejection fraction was in the range of 30% to 35%. Mid anteroseptal/anterolateral/inferolateral akinesis, basal anterior/septal/lateral akinesis, akinesis of the apex. Doppler parameters are consistent with abnormal left ventricular relaxation (grade 1 diastolic dysfunction).  ------------------------------------------------------------------- Aortic valve: Trileaflet; mildly calcified leaflets. Doppler: There was no stenosis. There was trivial regurgitation.  ------------------------------------------------------------------- Aorta: Aortic root: The aortic root was normal in size. Ascending aorta: The ascending aorta was normal in size.  ------------------------------------------------------------------- Mitral valve: Mildly calcified annulus. Doppler: There was no evidence for stenosis. There was trivial regurgitation. Peak gradient (D): 7 mm Hg.  ------------------------------------------------------------------- Left atrium: The atrium was normal in size.  ------------------------------------------------------------------- Right ventricle: The cavity size was normal. Systolic function was normal.  ------------------------------------------------------------------- Pulmonic valve: Structurally normal valve. Cusp separation was normal. Doppler: Transvalvular velocity was within the normal range. There was no regurgitation.  ------------------------------------------------------------------- Tricuspid valve: Doppler: There was no significant regurgitation.  ------------------------------------------------------------------- Pulmonary artery: No complete TR doppler jet so unable to estimate PA systolic pressure.  ------------------------------------------------------------------- Right atrium: The atrium was normal in size.  ------------------------------------------------------------------- Pericardium: A  trivial pericardial effusion was identified.  ------------------------------------------------------------------- Systemic veins: IVC measured 2.3 cm with > 50% respirophasic variation, suggesting RA pressure 8 mmHg.  ------------------------------------------------------------------- Measurements  Left ventricle Value Reference LV ID, ED, PLAX chordal (H) 56.2 mm 43 - 52 LV ID, ES, PLAX chordal (H) 43.8 mm 23 - 38 LV fx shortening, PLAX chordal (L) 22 % >=29 LV PW thickness, ED 8.9 mm --------- IVS/LV PW ratio, ED 1.07 <=1.3 LV ejection fraction, 1-p A4C 40 % --------- LV end-diastolic volume, 2-p 329 ml --------- LV end-systolic volume, 2-p 70 ml --------- LV ejection fraction, 2-p 38 % --------- Stroke volume, 2-p 43 ml --------- LV end-diastolic volume/bsa, 2-p 54 ml/m^2 --------- LV end-systolic volume/bsa, 2-p 33 ml/m^2 --------- Stroke volume/bsa, 2-p 20.3 ml/m^2 --------- LV e&', lateral 6.2 cm/s --------- LV E/e&', lateral 21.77 --------- LV e&', medial 4.4 cm/s --------- LV E/e&', medial 30.68 --------- LV e&', average 5.3 cm/s --------- LV E/e&', average 25.47 ---------  Ventricular septum Value Reference IVS thickness, ED 9.48 mm ---------  Aorta Value Reference Aortic root ID, ED 30 mm ---------  Left atrium Value Reference LA ID, A-P, ES 42 mm --------- LA ID/bsa, A-P 1.99 cm/m^2 <=2.2 LA volume, S 61.8 ml --------- LA volume/bsa, S 29.3 ml/m^2 ---------  Mitral valve Value Reference Mitral E-wave peak velocity 135 cm/s --------- Mitral A-wave peak velocity 131 cm/s --------- Mitral deceleration time (H) 239 ms 150 - 230 Mitral peak gradient, D 7 mm Hg --------- Mitral E/A ratio, peak 1 ---------  Systemic veins  Value Reference Estimated CVP 3 mm Hg ---------  Right ventricle Value Reference RV s&', lateral, S 13.7 cm/s ---------  Legend: (L) and (H) mark values outside specified reference  range.  ------------------------------------------------------------------- Prepared and Electronically Authenticated by  Loralie Champagne, M.D. [id number]:53:23    Impression:  Patient has severe three-vessel coronary artery disease status post acute non-ST segment elevation myocardial infarction. He has severe global left ventricular systolic dysfunction because of ischemic cardiomyopathy with ejection fraction estimated 30-35%. Comorbid medical problems include long-standing tobacco abuse with likely COPD and hypertension. I agree the patient would best be treated with surgical revascularization.    Plan:  I have reviewed the indications, risks, and potential benefits of coronary artery bypass grafting with the patient and his wife.  Alternative treatment strategies have been discussed.  The patient understands and accepts all potential associated risks of surgery including but not limited to risk of death, stroke or other neurologic complication, myocardial infarction, congestive heart failure, respiratory failure, renal failure, bleeding requiring blood transfusion and/or reexploration, aortic dissection or other major vascular complication, arrhythmia, heart block or bradycardia requiring permanent pacemaker, pneumonia, pleural effusion, wound infection, pulmonary embolus or other thromboembolic complication, chronic pain or other delayed complications related to median sternotomy, or the late recurrence of symptomatic ischemic heart disease and/or congestive heart failure.  The importance of long term risk modification have been emphasized.  All questions answered.  We plan CABG 1st case Friday 03/28/2014.   I spent in excess of 120 minutes during the conduct of this hospital consultation and >50% of this time involved direct face-to-face encounter for counseling and/or coordination of the patient's care.   Valentina Gu. Roxy Manns, MD 03/26/2014 8:28 PM

## 2014-03-26 NOTE — H&P (Signed)
General Aspect PCP: D. Hall Busing, MD Primary Cardiologist:  New - T. Rockey Situ, MD  _____________  63 y/o male w/o prior h/o CAD who presented to the ED today with c/p and elevated trop of 12. _____________      Past Medical History  Diagnosis Date  . Acute non-Q wave ST elevation myocardial infarction (STEMI) 9/13-14/2015  . Atherosclerotic heart disease of native coronary artery with unstable angina pectoris     a. 03/2014 NSTEMI/Cath: LM nl, LAD 18m(FFR 0.76), D1 1021mLCX 10029mCA 100m65m 35%  . Ischemic cardiomyopathy     a. 03/2014 EF 35% by LV gram.  . Essential hypertension   . Hyperlipidemia with target LDL less than 70   . Tobacco abuse   . GERD (gastroesophageal reflux disease)     ____________      Present Illness 63 y63 male with a prior h/o HTN, HL, and ongoing tobacco abuse.   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  Over the years, he has noted intermittent substernal chest discomfort and burning, that usually occurs after eating meals, especially after eating large portions of bread.  When this occurs, he usually takes an antacid and Ss resolve.  1 week ago today, he was playing golf and had reached the last hole.  There, he developed 7/10 substernal chest pressure and burning associated with diaphoresis.  He thought this was similar to his indigestion but lasted longer.  He went to the clubhouse and sat under an AC vent where Ss eventually eased off after ~ 40 mins.  He had no further chest discomfort for the remainder of the week, despite being outside and cutting down tree branches for a good portion of Saturday 9/12.  On Monday, 9/14, he awoke around 7:30 AM with recurrent 7/10 chest pressure and burning w/o associated Ss.  His wife had him take ASA without significant change in Ss.  Ss persisted all morning and he decided to go see his PCP after lunch.  From his PCP's office, he was referred to the ED where ECG was  non-acute however troponin was elevated @ 12.  We were asked to see Mr. FlinImrant night, and after interviewing him, his wife noted that she is [facility] they would prefer that he see Dr. ParaJosefa Halfe informed the secretarial staff and [facility].  Pt then decided this morning that he'd rather see us. [place] Troponin rose to >40.  He underwent cath on 9/15, revealing severe multivessel CAD with occlusive dzs of the RCA, LCX, and Diag and an 80% mLAD stenosis with an FFR of 0.76.  He has been pain free post-cath and [facility] eval.      Physical Exam:  GEN well developed, well nourished, no acute distress   HEENT hearing intact to voice, moist oral mucosa      NECK supple  no bruits/jvd   RESP normal resp effort  clear BS   CARD Regular rate and rhythm  Normal, S1, S2  No murmur   ABD denies tenderness  soft  normal BS   LYMPH negative neck   EXTR negative cyanosis/clubbing, negative edema, dp/pt 1+ bilat.  R groin w/o bleeding/bruit/hematoma.   SKIN normal to palpation   NEURO cranial nerves intact, motor/sensory function intact   PSYCH alert, A+O to time, place, person   Review  of Systems:  Subjective/Chief Complaint Chest discomfort   General: No Complaints   Skin: No Complaints   ENT: No Complaints   Eyes: No Complaints   Neck: No Complaints   Respiratory: No Complaints   Cardiovascular: Chest pain or discomfort  Tightness   Gastrointestinal: No Complaints   Genitourinary: No Complaints   Vascular: No Complaints   Musculoskeletal: No Complaints   Neurologic: No Complaints   Hematologic: No Complaints   Endocrine: No Complaints   Psychiatric: No Complaints   Review of Systems: All other systems were reviewed and found to be negative   Medications/Allergies Reviewed Medications/Allergies reviewed   Family & Social History:  Family and Social History:  Family History Negative  Father died of  muscle disorder ("like ALS") @ 45.  Mother died of brain tumor in her late 47's.      Social History positive  tobacco, Has been smoking ~ 1ppd x 40-50 yrs.  Drinks one beer every night after work.  No drugs.      Place of Living Home  Lives locally with wife.  Works as Librarian, academic in Scientist, research (life sciences).  Plays golf at least twice/week.          Admit Diagnosis:   ACUTE MI: Onset Date: 25-Mar-2014, Status: Active, Description: ACUTE MI  Home Medications: Medication Instructions Status  pravastatin 20 mg oral tablet 1 tab(s) orally once a day (at bedtime) Active  diltiazem 120 mg/24 hours oral capsule, extended release 1 cap(s) orally once a day Active  multivitamin 1 tab(s) orally once a day Active   Lab Results:  Thyroid:  15-Sep-15 04:59   Thyroid Stimulating Hormone 1.18 (0.45-4.50 (IU = International Unit)  ----------------------- Pregnant patients have  different reference  ranges for TSH:  - - - - - - - - - -  Pregnant, first trimetser:  0.36 - 2.50 uIU/mL)  Cardiology:  15-Sep-15 07:26   Ventricular Rate 57  Atrial Rate 57  P-R Interval 148  QRS Duration 94  QT 440  QTc 428  P Axis 64  R Axis 58  T Axis -170  ECG interpretation Sinus bradycardia Possible Left atrial enlargement Septal infarct , age undetermined ST & T wave abnormality, consider lateral ischemia Abnormal ECG No previous ECGs available Confirmed by Rockey Situ, TIMOTHY ([id number]) on 03/25/2014 8:38:34 AM  Overreader: Ida Rogue  Routine Chem:  15-Sep-15 04:59   Cholesterol, Serum 131  Triglycerides, Serum 61  HDL (INHOUSE) 42  VLDL Cholesterol Calculated 12  LDL Cholesterol Calculated 77 (Result(s) reported on 25 Mar 2014 at 05:58AM.)  Glucose, Serum  116  BUN 10  Creatinine (comp) 0.87  Sodium, Serum 140  Potassium, Serum 4.0  Chloride, Serum 107  CO2, Serum 25  Calcium (Total), Serum  8.4  Anion Gap 8  Osmolality (calc) 279  eGFR (African American) >60  eGFR (Non-African American) >60  (eGFR values <62m/min/1.73 m2 may be an indication of chronic kidney disease (CKD). Calculated eGFR is useful in patients with stable renal function. The eGFR calculation will not be reliable in acutely ill patients when serum creatinine is changing rapidly. It is not useful in  patients on dialysis. The eGFR calculation may not be applicable to patients at the low and high extremes of body sizes, pregnant women, and vegetarians.)  Hemoglobin A1c ([facility]) 5.8 (The American Diabetes Association recommends that a primary goal of therapy should be <7% and that physicians should reevaluate the treatment regimen in patients with HbA1c values consistently >8%.)  Cardiac:  14-Sep-15  15:36   Troponin I  12.00 (0.00-0.05 0.05 ng/mL or less: NEGATIVE  Repeat testing in 3-6 hrs  if clinically indicated. >0.05 ng/mL: POTENTIAL  MYOCARDIAL INJURY. Repeat  testing in 3-6 hrs if  clinically indicated. NOTE: An increase or decrease  of 30% or more on serial  testing suggests a  clinically important change)  CPK-MB, Serum  69.3 (Result(s) reported on 24 Mar 2014 at 04:17PM.)    19:36   Troponin I  34.00 (0.00-0.05 0.05 ng/mL or less: NEGATIVE  Repeat testing in 3-6 hrs  if clinically indicated. >0.05 ng/mL: POTENTIAL  MYOCARDIAL INJURY. Repeat  testing in 3-6 hrs if  clinically indicated. NOTE: An increase or decrease  of 30% or more on serial  testing suggests a  clinically important change)  CPK-MB, Serum  130.0 (Result(s) reported on 24 Mar 2014 at 08:04PM.)    23:25   Troponin I  > 40.00 (0.00-0.05 0.05 ng/mL or less: NEGATIVE  Repeat testing in 3-6 hrs  if clinically indicated. >0.05 ng/mL: POTENTIAL  MYOCARDIAL INJURY. Repeat  testing in 3-6 hrs if  clinically indicated. NOTE: An increase or decrease  of 30% or more on serial  testing suggests a  clinically important change)  CPK-MB, Serum  153.2 (Result(s) reported on 24 Mar 2014 at 11:58PM.)  Routine Hem:  15-Sep-15 04:59    WBC (CBC)  13.7  RBC (CBC) 4.51  Hemoglobin (CBC) 14.0  Hematocrit (CBC) 41.7  Platelet Count (CBC) 205  MCV 93  MCH 31.1  MCHC 33.6  RDW 13.6  Neutrophil % 73.2  Lymphocyte % 15.7  Monocyte % 10.2  Eosinophil % 0.6  Basophil % 0.3  Neutrophil #  10.0  Lymphocyte # 2.2  Monocyte #  1.4  Eosinophil # 0.1  Basophil # 0.0 (Result(s) reported on 25 Mar 2014 at 05:38AM.)   EKG:  Interpretation EKG shows NSR with T weave ABN in anterolateral leads   Radiology Results:  XRay:    14-Sep-15 15:48, Chest PA and Lateral  Chest PA and Lateral   REASON FOR EXAM:    chest pain  COMMENTS:       PROCEDURE: DXR - DXR CHEST PA (OR AP) AND LATERAL  - Mar 24 2014  3:48PM     CLINICAL DATA:  Chest pain.    EXAM:  CHEST  2 VIEW    COMPARISON:  None.    FINDINGS:  The heart size and mediastinal contours are within normal limits.  Left lung is clear. Mild right infrahilar opacity is noted  concerning for possible pneumonia. No pneumothorax or pleural  effusion is noted. The visualized skeletal structures are  unremarkable.     IMPRESSION:  Mild right infrahilar opacity is noted concerning for possible  pneumonia. Followup radiographs are recommended to ensure  resolution.      Electronically Signed    By: Sabino Dick M.D.    On: 03/24/2014 15:54       Verified By: Marveen Reeks, M.D.,    No Known Allergies:   Vital Signs/Nurse's Notes: **Vital Signs.:   15-Sep-15 06:15  Vital Signs Type Routine  Temperature Temperature (F) 98.4  Celsius 36.8  Temperature Source oral  Respirations Respirations 22  Systolic BP Systolic BP 119  Diastolic BP (mmHg) Diastolic BP (mmHg) 66  Mean BP 82  Pulse Ox % Pulse Ox % 95  Pulse Ox Activity Level  At rest  Oxygen Delivery Room Air/ 21 %    Impression   Principal Problem:  NSTEMI (non-ST elevated myocardial infarction) Active Problems:   Atherosclerotic heart disease of native coronary artery with unstable angina  pectoris   Ischemic cardiomyopathy   Essential hypertension   Hyperlipidemia with target LDL less than 70   Tobacco abuse   1. NSTEMI/ACS:   Pt presented after ~ 6 hrs of ongoing chest pain and pressure.  Troponin has risen to >40.  ECG non-acute on admission.  S/P cath yesterday revealing severe multivessel CAD.  For tx to [facility] today for CT surgery eval. Cont asa, heparin, bb, statin, ntp.  Plan to add plavix post-op.  Eventual cardiac rehab.  2.  HTN:   Stable.  Cont BB but will switch to coreg given LV dysfxn.  3. HL:   LDL 77.  Was on pravachol @ home.  Cont high potency statin.  LDL 77.  4.  Tob Abuse:  Cessation advised.  5.  ICM:  Ef 35%.  Euvolemic on exam. Switch bb to coreg and plan to add acei or arni post-operatively.  Will order echo.      Murray Hodgkins, NP 03/26/2014, 9:53 AM     I have seen and evaluated the patient this AM @ [facility] along with Murray Hodgkins, NP-C. I agree with his findings, examination as well as impression recommendations.  Admitted to [facility] 9/13-14 evening with ACS - ruled in for significant NSTEMI & had persistent CP -finally relived with NTG paste & IV Heparin. Cardiac Cath on 9/15 demonstrated severe MV CAD - RCA & Cx 100%, with FFR + ~70-80% LAD lesion.  LV Gram EF ~35% c/w Ischemic CM. He has not had any further Angina or CHF since cath.    Based upon severity of CAD & his presentation with large NSTEMI / ICM, urgent revascularization with CABG is recommended.  He was transferred to [facility] for TCTS Consultation for CABG.   MD Time with pt: 15 min  Rocco Kerkhoff W, M.D., M.S. Interventional Cardiologist   Pager # [phone]

## 2014-03-26 NOTE — Progress Notes (Signed)
  Echocardiogram 2D Echocardiogram has been performed.  Mauricio Po 03/26/2014, 5:37 PM

## 2014-03-27 ENCOUNTER — Other Ambulatory Visit: Payer: Self-pay

## 2014-03-27 ENCOUNTER — Inpatient Hospital Stay (HOSPITAL_COMMUNITY): Payer: BC Managed Care – PPO

## 2014-03-27 DIAGNOSIS — I2589 Other forms of chronic ischemic heart disease: Secondary | ICD-10-CM

## 2014-03-27 DIAGNOSIS — I214 Non-ST elevation (NSTEMI) myocardial infarction: Secondary | ICD-10-CM

## 2014-03-27 DIAGNOSIS — E785 Hyperlipidemia, unspecified: Secondary | ICD-10-CM

## 2014-03-27 DIAGNOSIS — I2510 Atherosclerotic heart disease of native coronary artery without angina pectoris: Secondary | ICD-10-CM

## 2014-03-27 DIAGNOSIS — I200 Unstable angina: Secondary | ICD-10-CM

## 2014-03-27 DIAGNOSIS — I10 Essential (primary) hypertension: Secondary | ICD-10-CM

## 2014-03-27 DIAGNOSIS — Z01810 Encounter for preprocedural cardiovascular examination: Secondary | ICD-10-CM

## 2014-03-27 LAB — LIPID PANEL
CHOL/HDL RATIO: 2.8 ratio
Cholesterol: 124 mg/dL (ref 0–200)
HDL: 44 mg/dL (ref >39)
LDL CALC: 66 mg/dL (ref 0–99)
Triglycerides: 72 mg/dL (ref <150)
VLDL: 14 mg/dL (ref 0–40)

## 2014-03-27 LAB — PULMONARY FUNCTION TEST
DL/VA % pred: 106 %
DL/VA % pred: 106 %
DL/VA: 4.99 ml/min/mmHg/L
DL/VA: 4.99 ml/min/mmHg/L
DLCO COR: 27.56 ml/min/mmHg
DLCO COR: 27.56 ml/min/mmHg
DLCO UNC % PRED: 78 %
DLCO UNC: 26.32 ml/min/mmHg
DLCO cor % pred: 81 %
DLCO cor % pred: 81 %
DLCO unc % pred: 78 %
DLCO unc: 26.32 ml/min/mmHg
FEF 25-75 PRE: 3.88 L/s
FEF 25-75 Pre: 3.88 L/sec
FEF2575-%Pred-Pre: 133 %
FEF2575-%Pred-Pre: 133 %
FEV1-%CHANGE-POST: -28 %
FEV1-%Change-Post: -28 %
FEV1-%PRED-PRE: 85 %
FEV1-%Pred-Post: 61 %
FEV1-%Pred-Post: 61 %
FEV1-%Pred-Pre: 85 %
FEV1-Post: 2.22 L
FEV1-Post: 2.22 L
FEV1-Pre: 3.11 L
FEV1-Pre: 3.11 L
FEV1FVC-%CHANGE-POST: -3 %
FEV1FVC-%Change-Post: -3 %
FEV1FVC-%Pred-Pre: 111 %
FEV1FVC-%Pred-Pre: 111 %
FEV6-%Change-Post: -25 %
FEV6-%Change-Post: -25 %
FEV6-%PRED-POST: 59 %
FEV6-%Pred-Post: 59 %
FEV6-%Pred-Pre: 80 %
FEV6-%Pred-Pre: 80 %
FEV6-PRE: 3.72 L
FEV6-Post: 2.75 L
FEV6-Post: 2.75 L
FEV6-Pre: 3.72 L
FEV6FVC-%Change-Post: 0 %
FEV6FVC-%Change-Post: 0 %
FEV6FVC-%PRED-PRE: 105 %
FEV6FVC-%Pred-Post: 105 %
FEV6FVC-%Pred-Post: 105 %
FEV6FVC-%Pred-Pre: 105 %
FVC-%Change-Post: -25 %
FVC-%Change-Post: -25 %
FVC-%PRED-PRE: 76 %
FVC-%Pred-Post: 56 %
FVC-%Pred-Post: 56 %
FVC-%Pred-Pre: 76 %
FVC-POST: 2.75 L
FVC-Post: 2.75 L
FVC-Pre: 3.72 L
FVC-Pre: 3.72 L
POST FEV1/FVC RATIO: 81 %
POST FEV1/FVC RATIO: 81 %
PRE FEV6/FVC RATIO: 100 %
Post FEV6/FVC ratio: 100 %
Post FEV6/FVC ratio: 100 %
Pre FEV1/FVC ratio: 84 %
Pre FEV1/FVC ratio: 84 %
Pre FEV6/FVC Ratio: 100 %
RV % pred: 88 %
RV % pred: 88 %
RV: 2.08 L
RV: 2.08 L
TLC % PRED: 84 %
TLC % pred: 84 %
TLC: 6.09 L
TLC: 6.09 L

## 2014-03-27 LAB — CBC
HCT: 37.6 % — ABNORMAL LOW (ref 39.0–52.0)
Hemoglobin: 13.1 g/dL (ref 13.0–17.0)
MCH: 30.8 pg (ref 26.0–34.0)
MCHC: 34.8 g/dL (ref 30.0–36.0)
MCV: 88.5 fL (ref 78.0–100.0)
PLATELETS: 191 10*3/uL (ref 150–400)
RBC: 4.25 MIL/uL (ref 4.22–5.81)
RDW: 13.6 % (ref 11.5–15.5)
WBC: 11.3 10*3/uL — ABNORMAL HIGH (ref 4.0–10.5)

## 2014-03-27 LAB — HEMOGLOBIN A1C
Hgb A1c MFr Bld: 5.9 % — ABNORMAL HIGH (ref <5.7)
Mean Plasma Glucose: 123 mg/dL — ABNORMAL HIGH (ref <117)

## 2014-03-27 LAB — BASIC METABOLIC PANEL
Anion gap: 14 (ref 5–15)
BUN: 12 mg/dL (ref 6–23)
CALCIUM: 8.5 mg/dL (ref 8.4–10.5)
CO2: 24 mEq/L (ref 19–32)
Chloride: 101 mEq/L (ref 96–112)
Creatinine, Ser: 0.82 mg/dL (ref 0.50–1.35)
GFR calc Af Amer: >90 mL/min (ref >90)
GFR calc non Af Amer: >90 mL/min (ref >90)
GLUCOSE: 101 mg/dL — AB (ref 70–99)
Potassium: 3.8 mEq/L (ref 3.7–5.3)
Sodium: 139 mEq/L (ref 137–147)

## 2014-03-27 LAB — PREALBUMIN: Prealbumin: 16.1 mg/dL — ABNORMAL LOW (ref 17.0–34.0)

## 2014-03-27 LAB — HEPARIN LEVEL (UNFRACTIONATED): Heparin Unfractionated: 0.35 IU/mL (ref 0.30–0.70)

## 2014-03-27 MED ORDER — CHLORHEXIDINE GLUCONATE 4 % EX LIQD
60.0000 mL | Freq: Once | CUTANEOUS | Status: AC
  Administered 2014-03-27: 4 via TOPICAL
  Filled 2014-03-27: qty 60

## 2014-03-27 MED ORDER — DEXTROSE 5 % IV SOLN
1.5000 g | INTRAVENOUS | Status: AC
  Administered 2014-03-28: 1.5 g via INTRAVENOUS
  Administered 2014-03-28: .75 g via INTRAVENOUS
  Filled 2014-03-27: qty 1.5

## 2014-03-27 MED ORDER — SODIUM CHLORIDE 0.9 % IV SOLN
INTRAVENOUS | Status: AC
  Administered 2014-03-28: 69.8 mL/h via INTRAVENOUS
  Administered 2014-03-28: 12:00:00 via INTRAVENOUS
  Filled 2014-03-27: qty 40

## 2014-03-27 MED ORDER — ALBUTEROL SULFATE (2.5 MG/3ML) 0.083% IN NEBU
2.5000 mg | INHALATION_SOLUTION | Freq: Once | RESPIRATORY_TRACT | Status: AC
  Administered 2014-03-27: 2.5 mg via RESPIRATORY_TRACT

## 2014-03-27 MED ORDER — DOPAMINE-DEXTROSE 3.2-5 MG/ML-% IV SOLN
2.0000 ug/kg/min | INTRAVENOUS | Status: AC
  Administered 2014-03-28: 3 ug/kg/min via INTRAVENOUS
  Filled 2014-03-27: qty 250

## 2014-03-27 MED ORDER — SODIUM CHLORIDE 0.9 % IV SOLN
INTRAVENOUS | Status: AC
  Administered 2014-03-28: 1.9 [IU]/h via INTRAVENOUS
  Filled 2014-03-27: qty 2.5

## 2014-03-27 MED ORDER — EPINEPHRINE HCL 1 MG/ML IJ SOLN
0.5000 ug/min | INTRAVENOUS | Status: DC
  Filled 2014-03-27: qty 4

## 2014-03-27 MED ORDER — POTASSIUM CHLORIDE 2 MEQ/ML IV SOLN
80.0000 meq | INTRAVENOUS | Status: DC
  Filled 2014-03-27: qty 40

## 2014-03-27 MED ORDER — METOPROLOL TARTRATE 12.5 MG HALF TABLET
12.5000 mg | ORAL_TABLET | Freq: Once | ORAL | Status: DC
  Filled 2014-03-27: qty 1

## 2014-03-27 MED ORDER — BISACODYL 5 MG PO TBEC
5.0000 mg | DELAYED_RELEASE_TABLET | Freq: Once | ORAL | Status: AC
  Administered 2014-03-27: 5 mg via ORAL
  Filled 2014-03-27: qty 1

## 2014-03-27 MED ORDER — PLASMA-LYTE 148 IV SOLN
INTRAVENOUS | Status: AC
  Administered 2014-03-28: 09:00:00
  Filled 2014-03-27: qty 2.5

## 2014-03-27 MED ORDER — PHENYLEPHRINE HCL 10 MG/ML IJ SOLN
30.0000 ug/min | INTRAVENOUS | Status: AC
  Administered 2014-03-28: 50 ug/min via INTRAVENOUS
  Filled 2014-03-27: qty 2

## 2014-03-27 MED ORDER — NITROGLYCERIN IN D5W 200-5 MCG/ML-% IV SOLN
2.0000 ug/min | INTRAVENOUS | Status: AC
  Administered 2014-03-28: 5 ug/min via INTRAVENOUS
  Filled 2014-03-27: qty 250

## 2014-03-27 MED ORDER — MAGNESIUM SULFATE 50 % IJ SOLN
40.0000 meq | INTRAMUSCULAR | Status: DC
  Filled 2014-03-27: qty 10

## 2014-03-27 MED ORDER — DEXMEDETOMIDINE HCL IN NACL 400 MCG/100ML IV SOLN
0.1000 ug/kg/h | INTRAVENOUS | Status: AC
  Administered 2014-03-28: .3 ug/kg/h via INTRAVENOUS
  Filled 2014-03-27: qty 100

## 2014-03-27 MED ORDER — VANCOMYCIN HCL 10 G IV SOLR
1250.0000 mg | INTRAVENOUS | Status: AC
  Administered 2014-03-28: 1250 mg via INTRAVENOUS
  Filled 2014-03-27: qty 1250

## 2014-03-27 MED ORDER — TEMAZEPAM 15 MG PO CAPS: 15.0000 mg | ORAL_CAPSULE | Freq: Once | ORAL | Status: AC | PRN

## 2014-03-27 MED ORDER — CEFUROXIME SODIUM 750 MG IJ SOLR
750.0000 mg | INTRAMUSCULAR | Status: DC
  Filled 2014-03-27: qty 750

## 2014-03-27 MED ORDER — SODIUM CHLORIDE 0.9 % IV SOLN
INTRAVENOUS | Status: DC
  Filled 2014-03-27: qty 30

## 2014-03-27 MED ORDER — CHLORHEXIDINE GLUCONATE 4 % EX LIQD
60.0000 mL | Freq: Once | CUTANEOUS | Status: AC
  Administered 2014-03-28: 4 via TOPICAL
  Filled 2014-03-27: qty 60

## 2014-03-27 MED ORDER — VANCOMYCIN HCL 1000 MG IV SOLR
INTRAVENOUS | Status: AC
  Administered 2014-03-28: 09:00:00
  Filled 2014-03-27: qty 1000

## 2014-03-27 NOTE — Progress Notes (Signed)
Utilization review completed. Luke Rigsbee, RN, BSN. 

## 2014-03-27 NOTE — Progress Notes (Signed)
      [address]       [place] [address]             [phone]     CARDIOTHORACIC SURGERY PROGRESS NOTE  Subjective: Feels well.  No chest pain.  Objective: Vital signs in last 24 hours: Temp:  [98.2 F (36.8 C)-99.1 F (37.3 C)] 98.2 F (36.8 C) (09/17 0800) Pulse Rate:  [62-77] 67 (09/17 1400) Cardiac Rhythm:  [-] Normal sinus rhythm (09/17 0750) Resp:  [0-28] 13 (09/17 1400) BP: (89-140)/(49-85) 117/74 mmHg (09/17 1400) SpO2:  [93 %-100 %] 100 % (09/17 1400) Weight:  [84.6 kg (186 lb 8.2 oz)-86.183 kg (190 lb)] 84.6 kg (186 lb 8.2 oz) (09/17 3094)  Physical Exam:  Rhythm:   sinus  Breath sounds: clear  Heart sounds:  RRR  Incisions:  n/a  Abdomen:  soft  Extremities:  warm   Intake/Output from previous day: 09/16 0701 - 09/17 0700 In: 228.8 [I.V.:228.8] Out: 2200 [Urine:2200] Intake/Output this shift: Total I/O In: 119 [I.V.:119] Out: 300 [Urine:300]  Lab Results:  Recent Labs  03/26/14 1911 03/27/14 0230  WBC 12.4* 11.3*  HGB 13.7 13.1  HCT 39.5 37.6*  PLT 175 191   BMET:  Recent Labs  03/26/14 1911 03/27/14 0230  NA 132* 139  K 4.0 3.8  CL 98 101  CO2 20 24  GLUCOSE 224* 101*  BUN 12 12  CREATININE 0.77 0.82  CALCIUM 8.5 8.5    CBG (last 3)  No results found for this basename: GLUCAP,  in the last 72 hours PT/INR:   Recent Labs  03/26/14 1911  LABPROT 15.8*  INR 1.26    CXR:  CHEST 2 VIEW  COMPARISON: 03/25/2014  FINDINGS:  Normal heart size. Chronic appearing interstitial coarsening noted.  No edema or pleural effusion. No airspace consolidation. The  visualized osseous structures are within normal limits.  IMPRESSION:  1. No active cardiopulmonary abnormalities.  Electronically Signed  By: Kerby Moors M.D.  On: 03/27/2014 10:24    Assessment/Plan: S/P Procedure(s) (LRB): CORONARY ARTERY BYPASS GRAFTING (CABG) (N/A) INTRAOPERATIVE TRANSESOPHAGEAL ECHOCARDIOGRAM (N/A)  Clinically stable For  CABG in am tomorrow All questions answered  Rexene Alberts 03/27/2014 3:18 PM

## 2014-03-27 NOTE — Progress Notes (Signed)
Patient Name: [name] [name] Date of Encounter: 03/27/2014     Principal Problem:   NSTEMI (non-ST elevated myocardial infarction) Active Problems:   Atherosclerotic heart disease of native coronary artery with unstable angina pectoris   Ischemic cardiomyopathy   Essential hypertension   Hyperlipidemia with target LDL less than 70   Tobacco abuse    SUBJECTIVE  Feels well - no angina or dyspnea  CURRENT MEDS . aspirin EC  81 mg Oral Daily  . atorvastatin  80 mg Oral q1800  . carvedilol  3.125 mg Oral BID WC  . nitroGLYCERIN  0.5 inch Topical 4 times per day  . sodium chloride  3 mL Intravenous Q12H    OBJECTIVE  Filed Vitals:   03/27/14 0625 03/27/14 0700 03/27/14 0738 03/27/14 0800  BP:  105/62 104/67 104/64  Pulse:  65 70 66  Temp:    98.2 F (36.8 C)  TempSrc:    Oral  Resp:  20  0  Height:      Weight: 186 lb 8.2 oz (84.6 kg)     SpO2:  97%  95%    Intake/Output Summary (Last 24 hours) at 03/27/14 0953 Last data filed at 03/27/14 0800  Gross per 24 hour  Intake 245.75 ml  Output   2200 ml  Net -1954.25 ml   Filed Weights   03/26/14 1700 03/27/14 0625  Weight: 190 lb (86.183 kg) 186 lb 8.2 oz (84.6 kg)    PHYSICAL EXAM  General: Pleasant, NAD. Neuro: Alert and oriented X 3. Moves all extremities spontaneously. Psych: Normal affect. HEENT:  Normal  Neck: Supple without bruits or JVD. Lungs:  Resp regular and unlabored, CTA. Heart: RRR no s3, s4, or murmurs. Abdomen: Soft, non-tender, non-distended, BS + x 4.  Extremities: No clubbing, cyanosis or edema. DP/PT/Radials 2+ and equal bilaterally.  Accessory Clinical Findings  CBC  Recent Labs  03/26/14 1911 03/27/14 0230  WBC 12.4* 11.3*  HGB 13.7 13.1  HCT 39.5 37.6*  MCV 89.6 88.5  PLT 175 440   Basic Metabolic Panel  Recent Labs  03/26/14 1911 03/27/14 0230  NA 132* 139  K 4.0 3.8  CL 98 101  CO2 20 24  GLUCOSE 224* 101*  BUN 12 12  CREATININE 0.77 0.82  CALCIUM 8.5 8.5     Liver Function Tests  Recent Labs  03/26/14 1911  AST 93*  ALT 30  ALKPHOS 49  BILITOT 0.6  PROT 6.3  ALBUMIN 3.0*   Hemoglobin A1C  Recent Labs  03/26/14 1911  HGBA1C 5.9*   Fasting Lipid Panel  Recent Labs  03/27/14 0230  CHOL 124  HDL 44  LDLCALC 66  TRIG 72  CHOLHDL 2.8     TELE  NSR  Radiology/Studies:   2D ECHO 03/26/2014 LV EF: 30% - 35% Study Conclusions - Left ventricle: The cavity size was mildly dilated. Wall thickness was increased in a pattern of mild LVH. Systolic function was moderately to severely reduced. The estimated ejection fraction was in the range of 30% to 35%. Mid anteroseptal/anterolateral/inferolateral akinesis, basal anterior/septal/lateral akinesis, akinesis of the apex. Doppler parameters are consistent with abnormal left ventricular relaxation (grade 1 diastolic dysfunction). - Aortic valve: There was no stenosis. There was trivial regurgitation. - Mitral valve: Mildly calcified annulus. There was trivial regurgitation. - Right ventricle: The cavity size was normal. Systolic function was normal. - Pulmonary arteries: No complete TR doppler jet so unable to estimate PA systolic pressure. - Systemic veins: IVC measured  2.3 cm with > 50% respirophasic variation, suggesting RA pressure 8 mmHg. - Pericardium, extracardiac: A trivial pericardial effusion was identified. Impressions: - Normal LV size with mild LV hypertrophy. EF 30-35% with wall motion abnormalities as noted above, suggestive of LAD-territory infarction. Normal RV size and systolic function. No significant valvular abnormalities.    ASSESSMENT AND PLAN  [name] [name] is a 63 y.o. male with a history of HTN, HL, and ongoing tobacco abuse who presented to [facility] on  03/23/14 with chest pain and found to have a significant NSTEMI. He underwent LHC which revealed significant multivessel CAD. Based upon severity of CAD & his presentation with large NSTEMI /  ICM, urgent revascularization with CABG was recommended. He was transferred to [facility] on 03/26/14 for TCTS consultation for CABG.    NSTEMI/ACS:  Pt presented after ~ 6 hrs of ongoing chest pain and pressure. Troponin >40. ECG non-acute on admission.  -- Cardiac Cath on 9/15 demonstrated severe MV CAD - RCA & Cx 100%, with FFR + ~70-80% LAD lesion. LV Gram EF ~35% c/w Ischemic CM.  -- Cont asa, heparin, bb, statin, ntp. Plan to add plavix post-op. Eventual cardiac rehab.  -- Dr. Roxy Manns saw yesterday and tentatively plan CABG 1st case Friday 9/18  ICM: Ef 35%. Euvolemic on exam. Switch bb to coreg and plan to add acei or arb post-operatively.  -- 2D ECHO yesterday with normal LV size with mild LV hypertrophy. EF 30-35% w/ WMA c/w LAD-territory infarction. Normal RV size and systolic function. No significant valvular abnormalities.  HTN:  -- On the soft side but stable. Switched to coreg 3.125mg  BID given LV dysfxn. Add ACE after surgery when stable.  HLD:  LDL 77. Was on pravachol @ home. Cont high potency statin.   Tob Abuse: cessation advised.   Pre-diabetes: HgA1c 5.9  -- Diet and exercise.    Mable Fill R PA-C  Pager [phone]  I have seen and examined the patient along with Angelena Form R PA-C.  I have reviewed the chart, notes and new data.  I agree with PA's note.  Key new complaints: symptom free, committed to quit smoking permanently Key examination changes: no arrhythmia, +S4, no other clinical signs of CHF   PLAN: CABG in AM.  Sanda Klein, MD, [facility] [phone] 03/27/2014, 1:39 PM

## 2014-03-27 NOTE — Progress Notes (Signed)
[id number] Cardiac Rehab Completed discharge education with pt. I gave him going for heart surgery booklet and pt care care guide. We discussed use of IS, sternal precautions and walking post-op. He states that his wife will be able to care for him 24/7 for the first week after surgery. I placed going for heart surgery video on for pt to watch. We will follow post-op as ordered. Deon Pilling, RN 03/27/2014 3:47 PM

## 2014-03-27 NOTE — Progress Notes (Signed)
*  Preliminary Results*  Pre-op Cardiac Surgery  Carotid Findings:  Findings suggest 1-39% internal carotid artery stenosis bilaterally. Vertebral arteries are patent with antegrade flow.  Upper Extremity Right Left  Brachial Pressures 105-Triphasic 104-Triphasic  Radial Waveforms Triphasic Triphasic  Ulnar Waveforms Triphasic Triphasic  Palmar Arch (Allen's Test) Signal obliterates with radial compression, is unaffected with ulnar compression. Signal obliterates with both radial and ulnar compression.   Findings:   Bilateral palpable pedal pulses.  03/27/2014 10:45 AM Maudry Mayhew, RVT, RDCS, RDMS

## 2014-03-27 NOTE — Progress Notes (Signed)
ANTICOAGULATION CONSULT NOTE - Initial Consult  Pharmacy Consult for heparin Indication: chest pain/ACS  No Known Allergies  Patient Measurements: Height: 6' (182.9 cm) Weight: 186 lb 8.2 oz (84.6 kg) IBW/kg (Calculated) : 77.6 Vital Signs: Temp: 98.2 F (36.8 C) (09/17 0800) Temp src: Oral (09/17 0800) BP: 112/63 mmHg (09/17 1100) Pulse Rate: 65 (09/17 1100)  Labs:  Recent Labs  03/26/14 1911 03/26/14 2058 03/27/14 0230  HGB 13.7  --  13.1  HCT 39.5  --  37.6*  PLT 175  --  191  APTT 77*  --   --   LABPROT 15.8*  --   --   INR 1.26  --   --   HEPARINUNFRC  --  0.28* 0.35  CREATININE 0.77  --  0.82    Estimated Creatinine Clearance: 101.2 ml/min (by C-G formula based on Cr of 0.82).   Medical History: Past Medical History  Diagnosis Date  . Acute non-Q wave ST elevation myocardial infarction (STEMI) 9/13-14/2015  . Atherosclerotic heart disease of native coronary artery with unstable angina pectoris     a. 03/2014 NSTEMI/Cath: LM nl, LAD 97m (FFR 0.76), D1 151m, LCX 19m, RCA 11m, EF 35%  . Ischemic cardiomyopathy     a. 03/2014 EF 35% by LV gram.  . Essential hypertension   . Hyperlipidemia with target LDL less than 70   . Tobacco abuse   . GERD (gastroesophageal reflux disease)     Medications:  See Emr  Assessment: Heparin level = 0.35, therapeutic on heparin drip at 1700 units/hr for ACS/ acute non STEMI in this 63 year old male. S/p cardiac cath 9/15 and found to have severe 3 vessel CAD and severe global left ventricular systolic dysfunction because of ischemic cardiomyopathy with EFestimated 30-35%. Patient was transferred from [facility] to [facility]. Scheduled for CABG 1st case Friday 03/28/2014.  CBC stable, no bleeding noted. Heparin to stop at midnight.   Goal of Therapy:  Heparin level 0.3-0.7 units/ml Monitor platelets by anticoagulation protocol: Yes   Plan:  Continue heparin at 1700 units/hr Daily HL, CBC. Heparin to stop at midnight- for CABG in  AM.  Nicole Cella, RPh Clinical Pharmacist Pager: [phone] 03/27/2014 1:10 PM

## 2014-03-28 ENCOUNTER — Inpatient Hospital Stay (HOSPITAL_COMMUNITY): Payer: BC Managed Care – PPO

## 2014-03-28 ENCOUNTER — Encounter (HOSPITAL_COMMUNITY): Payer: BC Managed Care – PPO | Admitting: Anesthesiology

## 2014-03-28 ENCOUNTER — Encounter (HOSPITAL_COMMUNITY): Payer: Self-pay | Admitting: Anesthesiology

## 2014-03-28 ENCOUNTER — Encounter (HOSPITAL_COMMUNITY)
Admission: EM | Disposition: A | Discharge status: Home / Self Care | Payer: BC Managed Care – PPO | Source: Other Acute Inpatient Hospital | Attending: Thoracic Surgery (Cardiothoracic Vascular Surgery)

## 2014-03-28 ENCOUNTER — Inpatient Hospital Stay (HOSPITAL_COMMUNITY): Payer: BC Managed Care – PPO | Admitting: Anesthesiology

## 2014-03-28 ENCOUNTER — Encounter: Payer: Self-pay | Admitting: Cardiovascular Disease

## 2014-03-28 DIAGNOSIS — Z951 Presence of aortocoronary bypass graft: Secondary | ICD-10-CM

## 2014-03-28 HISTORY — DX: Presence of aortocoronary bypass graft: Z95.1

## 2014-03-28 HISTORY — PX: CORONARY ARTERY BYPASS GRAFT: SHX141

## 2014-03-28 HISTORY — PX: INTRAOPERATIVE TRANSESOPHAGEAL ECHOCARDIOGRAM: SHX5062

## 2014-03-28 LAB — POCT I-STAT, CHEM 8
BUN: 8 mg/dL (ref 6–23)
BUN: 7 mg/dL (ref 6–23)
BUN: 7 mg/dL (ref 6–23)
BUN: 7 mg/dL (ref 6–23)
BUN: 7 mg/dL (ref 6–23)
BUN: 8 mg/dL (ref 6–23)
CHLORIDE: 105 meq/L (ref 96–112)
CREATININE: 0.6 mg/dL (ref 0.50–1.35)
CREATININE: 0.5 mg/dL (ref 0.50–1.35)
Calcium, Ion: 1.16 mmol/L (ref 1.13–1.30)
Calcium, Ion: 1.16 mmol/L (ref 1.13–1.30)
Calcium, Ion: 1.08 mmol/L — ABNORMAL LOW (ref 1.13–1.30)
Calcium, Ion: 1.1 mmol/L — ABNORMAL LOW (ref 1.13–1.30)
Calcium, Ion: 1.21 mmol/L (ref 1.13–1.30)
Calcium, Ion: 1.1 mmol/L — ABNORMAL LOW (ref 1.13–1.30)
Chloride: 104 mEq/L (ref 96–112)
Chloride: 105 mEq/L (ref 96–112)
Chloride: 104 mEq/L (ref 96–112)
Chloride: 102 mEq/L (ref 96–112)
Chloride: 105 mEq/L (ref 96–112)
Creatinine, Ser: 0.6 mg/dL (ref 0.50–1.35)
Creatinine, Ser: 0.5 mg/dL (ref 0.50–1.35)
Creatinine, Ser: 0.5 mg/dL (ref 0.50–1.35)
Creatinine, Ser: 0.6 mg/dL (ref 0.50–1.35)
GLUCOSE: 116 mg/dL — AB (ref 70–99)
GLUCOSE: 128 mg/dL — AB (ref 70–99)
GLUCOSE: 169 mg/dL — AB (ref 70–99)
Glucose, Bld: 122 mg/dL — ABNORMAL HIGH (ref 70–99)
Glucose, Bld: 117 mg/dL — ABNORMAL HIGH (ref 70–99)
Glucose, Bld: 143 mg/dL — ABNORMAL HIGH (ref 70–99)
HCT: 30 % — ABNORMAL LOW (ref 39.0–52.0)
HCT: 29 % — ABNORMAL LOW (ref 39.0–52.0)
HCT: 33 % — ABNORMAL LOW (ref 39.0–52.0)
HEMATOCRIT: 35 % — AB (ref 39.0–52.0)
HEMATOCRIT: 32 % — AB (ref 39.0–52.0)
HEMATOCRIT: 30 % — AB (ref 39.0–52.0)
HEMOGLOBIN: 11.9 g/dL — AB (ref 13.0–17.0)
HEMOGLOBIN: 9.9 g/dL — AB (ref 13.0–17.0)
HEMOGLOBIN: 10.2 g/dL — AB (ref 13.0–17.0)
HEMOGLOBIN: 11.2 g/dL — AB (ref 13.0–17.0)
Hemoglobin: 10.9 g/dL — ABNORMAL LOW (ref 13.0–17.0)
Hemoglobin: 10.2 g/dL — ABNORMAL LOW (ref 13.0–17.0)
POTASSIUM: 3.8 meq/L (ref 3.7–5.3)
POTASSIUM: 4.4 meq/L (ref 3.7–5.3)
Potassium: 3.7 mEq/L (ref 3.7–5.3)
Potassium: 5.1 mEq/L (ref 3.7–5.3)
Potassium: 4.8 mEq/L (ref 3.7–5.3)
Potassium: 4 mEq/L (ref 3.7–5.3)
SODIUM: 138 meq/L (ref 137–147)
SODIUM: 138 meq/L (ref 137–147)
SODIUM: 137 meq/L (ref 137–147)
Sodium: 137 mEq/L (ref 137–147)
Sodium: 138 mEq/L (ref 137–147)
Sodium: 137 mEq/L (ref 137–147)
TCO2: 26 mmol/L (ref 0–100)
TCO2: 27 mmol/L (ref 0–100)
TCO2: 24 mmol/L (ref 0–100)
TCO2: 26 mmol/L (ref 0–100)
TCO2: 22 mmol/L (ref 0–100)
TCO2: 19 mmol/L (ref 0–100)

## 2014-03-28 LAB — CBC
HCT: 37.7 % — ABNORMAL LOW (ref 39.0–52.0)
HCT: 35.7 % — ABNORMAL LOW (ref 39.0–52.0)
HCT: 32.3 % — ABNORMAL LOW (ref 39.0–52.0)
HEMOGLOBIN: 13.1 g/dL (ref 13.0–17.0)
HEMOGLOBIN: 11.3 g/dL — AB (ref 13.0–17.0)
Hemoglobin: 12.4 g/dL — ABNORMAL LOW (ref 13.0–17.0)
MCH: 30.9 pg (ref 26.0–34.0)
MCH: 31.4 pg (ref 26.0–34.0)
MCH: 31.8 pg (ref 26.0–34.0)
MCHC: 34.7 g/dL (ref 30.0–36.0)
MCHC: 34.7 g/dL (ref 30.0–36.0)
MCHC: 35 g/dL (ref 30.0–36.0)
MCV: 88.9 fL (ref 78.0–100.0)
MCV: 90.4 fL (ref 78.0–100.0)
MCV: 91 fL (ref 78.0–100.0)
Platelets: 201 10*3/uL (ref 150–400)
Platelets: 133 10*3/uL — ABNORMAL LOW (ref 150–400)
Platelets: 132 10*3/uL — ABNORMAL LOW (ref 150–400)
RBC: 4.24 MIL/uL (ref 4.22–5.81)
RBC: 3.95 MIL/uL — ABNORMAL LOW (ref 4.22–5.81)
RBC: 3.55 MIL/uL — AB (ref 4.22–5.81)
RDW: 13.7 % (ref 11.5–15.5)
RDW: 13.5 % (ref 11.5–15.5)
RDW: 13.4 % (ref 11.5–15.5)
WBC: 9.3 10*3/uL (ref 4.0–10.5)
WBC: 16.3 10*3/uL — ABNORMAL HIGH (ref 4.0–10.5)
WBC: 15 10*3/uL — ABNORMAL HIGH (ref 4.0–10.5)

## 2014-03-28 LAB — POCT I-STAT 3, ART BLOOD GAS (G3+)
ACID-BASE DEFICIT: 2 mmol/L (ref 0.0–2.0)
Acid-base deficit: 2 mmol/L (ref 0.0–2.0)
Acid-base deficit: 4 mmol/L — ABNORMAL HIGH (ref 0.0–2.0)
BICARBONATE: 24.4 meq/L — AB (ref 20.0–24.0)
BICARBONATE: 23.4 meq/L (ref 20.0–24.0)
Bicarbonate: 24.2 mEq/L — ABNORMAL HIGH (ref 20.0–24.0)
Bicarbonate: 20.7 mEq/L (ref 20.0–24.0)
O2 SAT: 100 %
O2 SAT: 92 %
O2 Saturation: 100 %
O2 Saturation: 97 %
PCO2 ART: 38.5 mmHg (ref 35.0–45.0)
PCO2 ART: 36.7 mmHg (ref 35.0–45.0)
PH ART: 7.41 (ref 7.350–7.450)
PH ART: 7.364 (ref 7.350–7.450)
Patient temperature: 38.1
TCO2: 26 mmol/L (ref 0–100)
TCO2: 25 mmol/L (ref 0–100)
TCO2: 25 mmol/L (ref 0–100)
TCO2: 22 mmol/L (ref 0–100)
pCO2 arterial: 44.2 mmHg (ref 35.0–45.0)
pCO2 arterial: 40.1 mmHg (ref 35.0–45.0)
pH, Arterial: 7.345 — ABNORMAL LOW (ref 7.350–7.450)
pH, Arterial: 7.374 (ref 7.350–7.450)
pO2, Arterial: 332 mmHg — ABNORMAL HIGH (ref 80.0–100.0)
pO2, Arterial: 219 mmHg — ABNORMAL HIGH (ref 80.0–100.0)
pO2, Arterial: 66 mmHg — ABNORMAL LOW (ref 80.0–100.0)
pO2, Arterial: 99 mmHg (ref 80.0–100.0)

## 2014-03-28 LAB — POCT I-STAT 4, (NA,K, GLUC, HGB,HCT)
GLUCOSE: 113 mg/dL — AB (ref 70–99)
HCT: 33 % — ABNORMAL LOW (ref 39.0–52.0)
Hemoglobin: 11.2 g/dL — ABNORMAL LOW (ref 13.0–17.0)
Potassium: 3.6 mEq/L — ABNORMAL LOW (ref 3.7–5.3)
Sodium: 138 mEq/L (ref 137–147)

## 2014-03-28 LAB — HEMOGLOBIN AND HEMATOCRIT, BLOOD
HCT: 29 % — ABNORMAL LOW (ref 39.0–52.0)
HEMOGLOBIN: 10.3 g/dL — AB (ref 13.0–17.0)

## 2014-03-28 LAB — HEPARIN LEVEL (UNFRACTIONATED): Heparin Unfractionated: <0.1 IU/mL — ABNORMAL LOW (ref 0.30–0.70)

## 2014-03-28 LAB — MAGNESIUM: Magnesium: 2.7 mg/dL — ABNORMAL HIGH (ref 1.5–2.5)

## 2014-03-28 LAB — APTT: aPTT: 29 seconds (ref 24–37)

## 2014-03-28 LAB — PROTIME-INR
INR: 1.48 (ref 0.00–1.49)
Prothrombin Time: 17.9 seconds — ABNORMAL HIGH (ref 11.6–15.2)

## 2014-03-28 LAB — CREATININE, SERUM
Creatinine, Ser: 0.66 mg/dL (ref 0.50–1.35)
GFR calc non Af Amer: >90 mL/min (ref >90)

## 2014-03-28 LAB — PLATELET COUNT: Platelets: 153 10*3/uL (ref 150–400)

## 2014-03-28 SURGERY — CORONARY ARTERY BYPASS GRAFTING (CABG)
Anesthesia: General | Site: Chest

## 2014-03-28 MED ORDER — VANCOMYCIN HCL IN DEXTROSE 1-5 GM/200ML-% IV SOLN
1000.0000 mg | Freq: Once | INTRAVENOUS | Status: AC
  Administered 2014-03-28: 1000 mg via INTRAVENOUS
  Filled 2014-03-28: qty 200

## 2014-03-28 MED ORDER — PHENYLEPHRINE 40 MCG/ML (10ML) SYRINGE FOR IV PUSH (FOR BLOOD PRESSURE SUPPORT)
PREFILLED_SYRINGE | INTRAVENOUS | Status: AC
  Filled 2014-03-28: qty 20

## 2014-03-28 MED ORDER — FAMOTIDINE IN NACL 20-0.9 MG/50ML-% IV SOLN
20.0000 mg | Freq: Two times a day (BID) | INTRAVENOUS | Status: AC
  Administered 2014-03-28: 20 mg via INTRAVENOUS

## 2014-03-28 MED ORDER — GLYCOPYRROLATE 0.2 MG/ML IJ SOLN
INTRAMUSCULAR | Status: DC | PRN
  Administered 2014-03-28 (×2): 0.2 mg via INTRAVENOUS

## 2014-03-28 MED ORDER — MIDAZOLAM HCL 10 MG/2ML IJ SOLN
INTRAMUSCULAR | Status: AC
  Filled 2014-03-28: qty 2

## 2014-03-28 MED ORDER — PROPOFOL 10 MG/ML IV BOLUS
INTRAVENOUS | Status: AC
  Filled 2014-03-28: qty 20

## 2014-03-28 MED ORDER — EPHEDRINE SULFATE 50 MG/ML IJ SOLN
INTRAMUSCULAR | Status: AC
  Filled 2014-03-28: qty 1

## 2014-03-28 MED ORDER — PHENYLEPHRINE 40 MCG/ML (10ML) SYRINGE FOR IV PUSH (FOR BLOOD PRESSURE SUPPORT)
PREFILLED_SYRINGE | INTRAVENOUS | Status: AC
  Filled 2014-03-28: qty 10

## 2014-03-28 MED ORDER — SODIUM CHLORIDE 0.9 % IJ SOLN
INTRAMUSCULAR | Status: DC | PRN
  Administered 2014-03-28 (×3): via TOPICAL

## 2014-03-28 MED ORDER — ETOMIDATE 2 MG/ML IV SOLN
INTRAVENOUS | Status: AC
  Filled 2014-03-28: qty 10

## 2014-03-28 MED ORDER — LACTATED RINGERS IV SOLN: INTRAVENOUS | Status: DC

## 2014-03-28 MED ORDER — LIDOCAINE HCL (CARDIAC) 20 MG/ML IV SOLN
INTRAVENOUS | Status: DC | PRN
  Administered 2014-03-28: 40 mg via INTRAVENOUS

## 2014-03-28 MED ORDER — SUCCINYLCHOLINE CHLORIDE 20 MG/ML IJ SOLN
INTRAMUSCULAR | Status: AC
  Filled 2014-03-28: qty 1

## 2014-03-28 MED ORDER — ROCURONIUM BROMIDE 50 MG/5ML IV SOLN
INTRAVENOUS | Status: AC
  Filled 2014-03-28: qty 1

## 2014-03-28 MED ORDER — ALBUMIN HUMAN 5 % IV SOLN
250.0000 mL | INTRAVENOUS | Status: AC | PRN
  Administered 2014-03-28 (×3): 250 mL via INTRAVENOUS
  Filled 2014-03-28: qty 250

## 2014-03-28 MED ORDER — LACTATED RINGERS IV SOLN
INTRAVENOUS | Status: DC | PRN
  Administered 2014-03-28 (×2): via INTRAVENOUS

## 2014-03-28 MED ORDER — BISACODYL 5 MG PO TBEC
10.0000 mg | DELAYED_RELEASE_TABLET | Freq: Every day | ORAL | Status: DC
  Administered 2014-03-29 – 2014-03-30 (×2): 10 mg via ORAL
  Filled 2014-03-28 (×2): qty 2

## 2014-03-28 MED ORDER — ACETAMINOPHEN 500 MG PO TABS
1000.0000 mg | ORAL_TABLET | Freq: Four times a day (QID) | ORAL | Status: DC
  Administered 2014-03-29 – 2014-04-01 (×12): 1000 mg via ORAL
  Filled 2014-03-28 (×19): qty 2

## 2014-03-28 MED ORDER — ASPIRIN EC 325 MG PO TBEC
325.0000 mg | DELAYED_RELEASE_TABLET | Freq: Every day | ORAL | Status: DC
  Administered 2014-03-29 – 2014-04-02 (×5): 325 mg via ORAL
  Filled 2014-03-28 (×5): qty 1

## 2014-03-28 MED ORDER — HEPARIN SODIUM (PORCINE) 1000 UNIT/ML IJ SOLN
INTRAMUSCULAR | Status: AC
  Filled 2014-03-28: qty 1

## 2014-03-28 MED ORDER — OXYCODONE HCL 5 MG PO TABS: 5.0000 mg | ORAL_TABLET | ORAL | Status: DC | PRN

## 2014-03-28 MED ORDER — DOCUSATE SODIUM 100 MG PO CAPS
200.0000 mg | ORAL_CAPSULE | Freq: Every day | ORAL | Status: DC
  Administered 2014-03-29 – 2014-04-02 (×5): 200 mg via ORAL
  Filled 2014-03-28 (×5): qty 2

## 2014-03-28 MED ORDER — SODIUM CHLORIDE 0.45 % IV SOLN
INTRAVENOUS | Status: DC
  Administered 2014-03-28: 20 mL/h via INTRAVENOUS

## 2014-03-28 MED ORDER — SODIUM CHLORIDE 0.9 % IV SOLN
INTRAVENOUS | Status: DC
  Administered 2014-03-28: 10 mL/h via INTRAVENOUS

## 2014-03-28 MED ORDER — MILRINONE IN DEXTROSE 20 MG/100ML IV SOLN
0.1250 ug/kg/min | INTRAVENOUS | Status: DC
  Administered 2014-03-29: 0.25 ug/kg/min via INTRAVENOUS
  Filled 2014-03-28: qty 100

## 2014-03-28 MED ORDER — LACTATED RINGERS IV SOLN: 500.0000 mL | Freq: Once | INTRAVENOUS | Status: AC | PRN

## 2014-03-28 MED ORDER — STERILE WATER FOR INJECTION IJ SOLN
INTRAMUSCULAR | Status: AC
  Filled 2014-03-28: qty 10

## 2014-03-28 MED ORDER — SODIUM CHLORIDE 0.9 % IV SOLN: 250.0000 mL | INTRAVENOUS | Status: DC

## 2014-03-28 MED ORDER — EPHEDRINE SULFATE 50 MG/ML IJ SOLN
INTRAMUSCULAR | Status: DC | PRN
  Administered 2014-03-28: 5 mg via INTRAVENOUS

## 2014-03-28 MED ORDER — DEXMEDETOMIDINE HCL IN NACL 200 MCG/50ML IV SOLN
0.1000 ug/kg/h | INTRAVENOUS | Status: DC
  Administered 2014-03-28: 0.5 ug/kg/h via INTRAVENOUS
  Administered 2014-03-29: 0.2 ug/kg/h via INTRAVENOUS
  Filled 2014-03-28 (×2): qty 50

## 2014-03-28 MED ORDER — FENTANYL CITRATE 0.05 MG/ML IJ SOLN
INTRAMUSCULAR | Status: DC | PRN
  Administered 2014-03-28: 550 ug via INTRAVENOUS
  Administered 2014-03-28: 200 ug via INTRAVENOUS
  Administered 2014-03-28: 50 ug via INTRAVENOUS
  Administered 2014-03-28 (×2): 150 ug via INTRAVENOUS
  Administered 2014-03-28: 100 ug via INTRAVENOUS
  Administered 2014-03-28 (×2): 200 ug via INTRAVENOUS
  Administered 2014-03-28: 100 ug via INTRAVENOUS
  Administered 2014-03-28: 50 ug via INTRAVENOUS

## 2014-03-28 MED ORDER — GLYCOPYRROLATE 0.2 MG/ML IJ SOLN
INTRAMUSCULAR | Status: AC
  Filled 2014-03-28: qty 2

## 2014-03-28 MED ORDER — PROTAMINE SULFATE 10 MG/ML IV SOLN
INTRAVENOUS | Status: AC
  Filled 2014-03-28: qty 25

## 2014-03-28 MED ORDER — PHENYLEPHRINE HCL 10 MG/ML IJ SOLN
INTRAMUSCULAR | Status: DC | PRN
  Administered 2014-03-28: 80 ug via INTRAVENOUS
  Administered 2014-03-28: 60 ug via INTRAVENOUS
  Administered 2014-03-28: 80 ug via INTRAVENOUS

## 2014-03-28 MED ORDER — VECURONIUM BROMIDE 10 MG IV SOLR
INTRAVENOUS | Status: AC
  Filled 2014-03-28: qty 10

## 2014-03-28 MED ORDER — PHENYLEPHRINE HCL 10 MG/ML IJ SOLN
0.0000 ug/min | INTRAMUSCULAR | Status: DC
Start: 2014-03-28 — End: 2014-03-30
  Administered 2014-03-29: 30 ug/min via INTRAVENOUS
  Filled 2014-03-28 (×3): qty 2

## 2014-03-28 MED ORDER — ACETYLCYSTEINE 20 % IN SOLN
3.0000 mL | Freq: Once | RESPIRATORY_TRACT | Status: AC
  Administered 2014-03-28: 3 mL via RESPIRATORY_TRACT
  Filled 2014-03-28: qty 4

## 2014-03-28 MED ORDER — MORPHINE SULFATE 2 MG/ML IJ SOLN
1.0000 mg | INTRAMUSCULAR | Status: AC | PRN
  Administered 2014-03-28: 4 mg via INTRAVENOUS
  Administered 2014-03-28 (×3): 2 mg via INTRAVENOUS
  Filled 2014-03-28: qty 2

## 2014-03-28 MED ORDER — INSULIN REGULAR BOLUS VIA INFUSION
0.0000 [IU] | Freq: Three times a day (TID) | INTRAVENOUS | Status: DC
  Filled 2014-03-28: qty 10

## 2014-03-28 MED ORDER — METOPROLOL TARTRATE 12.5 MG HALF TABLET
12.5000 mg | ORAL_TABLET | Freq: Two times a day (BID) | ORAL | Status: DC
  Administered 2014-03-30 – 2014-04-02 (×6): 12.5 mg via ORAL
  Filled 2014-03-28 (×11): qty 1

## 2014-03-28 MED ORDER — METOCLOPRAMIDE HCL 5 MG/ML IJ SOLN
10.0000 mg | Freq: Four times a day (QID) | INTRAMUSCULAR | Status: AC
  Administered 2014-03-28 – 2014-03-29 (×4): 10 mg via INTRAVENOUS
  Filled 2014-03-28 (×3): qty 2

## 2014-03-28 MED ORDER — ASPIRIN 81 MG PO CHEW: 324.0000 mg | CHEWABLE_TABLET | Freq: Every day | ORAL | Status: DC

## 2014-03-28 MED ORDER — FENTANYL CITRATE 0.05 MG/ML IJ SOLN
INTRAMUSCULAR | Status: AC
  Filled 2014-03-28: qty 5

## 2014-03-28 MED ORDER — ALBUTEROL SULFATE (2.5 MG/3ML) 0.083% IN NEBU
2.5000 mg | INHALATION_SOLUTION | Freq: Once | RESPIRATORY_TRACT | Status: AC
  Administered 2014-03-28: 2.5 mg via RESPIRATORY_TRACT
  Filled 2014-03-28: qty 3

## 2014-03-28 MED ORDER — ONDANSETRON HCL 4 MG/2ML IJ SOLN: 4.0000 mg | Freq: Four times a day (QID) | INTRAMUSCULAR | Status: DC | PRN

## 2014-03-28 MED ORDER — MIDAZOLAM HCL 5 MG/5ML IJ SOLN
INTRAMUSCULAR | Status: DC | PRN
  Administered 2014-03-28 (×2): 1 mg via INTRAVENOUS
  Administered 2014-03-28: 2 mg via INTRAVENOUS
  Administered 2014-03-28: 1 mg via INTRAVENOUS
  Administered 2014-03-28: 2 mg via INTRAVENOUS
  Administered 2014-03-28 (×3): 1 mg via INTRAVENOUS

## 2014-03-28 MED ORDER — MAGNESIUM SULFATE 4000MG/100ML IJ SOLN
4.0000 g | Freq: Once | INTRAMUSCULAR | Status: AC
  Administered 2014-03-28: 4 g via INTRAVENOUS
  Filled 2014-03-28: qty 100

## 2014-03-28 MED ORDER — PROPOFOL 10 MG/ML IV BOLUS
INTRAVENOUS | Status: DC | PRN
  Administered 2014-03-28: 50 mg via INTRAVENOUS

## 2014-03-28 MED ORDER — ACETAMINOPHEN 160 MG/5ML PO SOLN: 650.0000 mg | Freq: Once | ORAL | Status: AC

## 2014-03-28 MED ORDER — DOPAMINE-DEXTROSE 3.2-5 MG/ML-% IV SOLN: 0.0000 ug/kg/min | INTRAVENOUS | Status: DC

## 2014-03-28 MED ORDER — MILRINONE IN DEXTROSE 20 MG/100ML IV SOLN
0.1250 ug/kg/min | INTRAVENOUS | Status: AC
  Administered 2014-03-28: .3 ug/kg/min via INTRAVENOUS
  Filled 2014-03-28: qty 100

## 2014-03-28 MED ORDER — SODIUM CHLORIDE 0.9 % IJ SOLN
3.0000 mL | Freq: Two times a day (BID) | INTRAMUSCULAR | Status: DC
  Administered 2014-03-29: 10:00:00 via INTRAVENOUS

## 2014-03-28 MED ORDER — ARTIFICIAL TEARS OP OINT
TOPICAL_OINTMENT | OPHTHALMIC | Status: AC
  Filled 2014-03-28: qty 3.5

## 2014-03-28 MED ORDER — SODIUM CHLORIDE 0.9 % IV SOLN
INTRAVENOUS | Status: DC
  Filled 2014-03-28: qty 2.5

## 2014-03-28 MED ORDER — SODIUM CHLORIDE 0.9 % IV SOLN
250.0000 mL | INTRAVENOUS | Status: AC
  Administered 2014-03-28: 1000 mL via INTRAVENOUS

## 2014-03-28 MED ORDER — PANTOPRAZOLE SODIUM 40 MG PO TBEC
40.0000 mg | DELAYED_RELEASE_TABLET | Freq: Every day | ORAL | Status: DC
  Administered 2014-03-30 – 2014-04-01 (×3): 40 mg via ORAL
  Filled 2014-03-28 (×2): qty 1

## 2014-03-28 MED ORDER — CALCIUM CHLORIDE 10 % IV SOLN
INTRAVENOUS | Status: AC
  Filled 2014-03-28: qty 10

## 2014-03-28 MED ORDER — LIDOCAINE HCL (CARDIAC) 20 MG/ML IV SOLN
INTRAVENOUS | Status: AC
  Filled 2014-03-28: qty 5

## 2014-03-28 MED ORDER — ACETAMINOPHEN 650 MG RE SUPP
650.0000 mg | Freq: Once | RECTAL | Status: AC
  Administered 2014-03-28: 650 mg via RECTAL

## 2014-03-28 MED ORDER — PROTAMINE SULFATE 10 MG/ML IV SOLN
INTRAVENOUS | Status: AC
  Filled 2014-03-28: qty 5

## 2014-03-28 MED ORDER — ARTIFICIAL TEARS OP OINT
TOPICAL_OINTMENT | OPHTHALMIC | Status: DC | PRN
  Administered 2014-03-28: 1 via OPHTHALMIC

## 2014-03-28 MED ORDER — POTASSIUM CHLORIDE 10 MEQ/50ML IV SOLN
10.0000 meq | INTRAVENOUS | Status: AC
  Administered 2014-03-28 (×3): 10 meq via INTRAVENOUS

## 2014-03-28 MED ORDER — INSULIN ASPART 100 UNIT/ML ~~LOC~~ SOLN
0.0000 [IU] | SUBCUTANEOUS | Status: DC
  Administered 2014-03-28 – 2014-03-29 (×4): 2 [IU] via SUBCUTANEOUS

## 2014-03-28 MED ORDER — ACETAMINOPHEN 160 MG/5ML PO SOLN
1000.0000 mg | Freq: Four times a day (QID) | ORAL | Status: DC
  Filled 2014-03-28: qty 40

## 2014-03-28 MED ORDER — ALBUTEROL SULFATE (2.5 MG/3ML) 0.083% IN NEBU
2.5000 mg | INHALATION_SOLUTION | RESPIRATORY_TRACT | Status: DC
  Administered 2014-03-28 – 2014-03-29 (×3): 2.5 mg via RESPIRATORY_TRACT
  Filled 2014-03-28 (×4): qty 3

## 2014-03-28 MED ORDER — METOPROLOL TARTRATE 1 MG/ML IV SOLN: 2.5000 mg | INTRAVENOUS | Status: DC | PRN

## 2014-03-28 MED ORDER — METOPROLOL TARTRATE 25 MG/10 ML ORAL SUSPENSION
12.5000 mg | Freq: Two times a day (BID) | ORAL | Status: DC
  Filled 2014-03-28 (×7): qty 5

## 2014-03-28 MED ORDER — SODIUM CHLORIDE 0.9 % IJ SOLN: 3.0000 mL | INTRAMUSCULAR | Status: DC | PRN

## 2014-03-28 MED ORDER — SODIUM CHLORIDE 0.9 % IV SOLN: Freq: Once | INTRAVENOUS | Status: DC

## 2014-03-28 MED ORDER — MIDAZOLAM HCL 2 MG/2ML IJ SOLN: 2.0000 mg | INTRAMUSCULAR | Status: DC | PRN

## 2014-03-28 MED ORDER — LACTATED RINGERS IV SOLN
INTRAVENOUS | Status: DC | PRN
  Administered 2014-03-28 (×2): via INTRAVENOUS

## 2014-03-28 MED ORDER — BISACODYL 10 MG RE SUPP: 10.0000 mg | Freq: Every day | RECTAL | Status: DC

## 2014-03-28 MED ORDER — 0.9 % SODIUM CHLORIDE (POUR BTL) OPTIME
TOPICAL | Status: DC | PRN
  Administered 2014-03-28: 1000 mL

## 2014-03-28 MED ORDER — SODIUM CHLORIDE 0.9 % IV SOLN
0.5000 g/h | INTRAVENOUS | Status: DC
  Filled 2014-03-28: qty 20

## 2014-03-28 MED ORDER — HEPARIN SODIUM (PORCINE) 1000 UNIT/ML IJ SOLN
INTRAMUSCULAR | Status: DC | PRN
  Administered 2014-03-28: 30000 [IU] via INTRAVENOUS

## 2014-03-28 MED ORDER — PROTAMINE SULFATE 10 MG/ML IV SOLN
INTRAVENOUS | Status: DC | PRN
  Administered 2014-03-28: 75 mg via INTRAVENOUS
  Administered 2014-03-28: 50 mg via INTRAVENOUS
  Administered 2014-03-28: 75 mg via INTRAVENOUS
  Administered 2014-03-28: 25 mg via INTRAVENOUS
  Administered 2014-03-28: 75 mg via INTRAVENOUS

## 2014-03-28 MED ORDER — VECURONIUM BROMIDE 10 MG IV SOLR
INTRAVENOUS | Status: DC | PRN
  Administered 2014-03-28: 10 mg via INTRAVENOUS
  Administered 2014-03-28 (×2): 4 mg via INTRAVENOUS
  Administered 2014-03-28: 10 mg via INTRAVENOUS

## 2014-03-28 MED ORDER — ROCURONIUM BROMIDE 100 MG/10ML IV SOLN
INTRAVENOUS | Status: DC | PRN
  Administered 2014-03-28: 50 mg via INTRAVENOUS

## 2014-03-28 MED ORDER — NITROGLYCERIN IN D5W 200-5 MCG/ML-% IV SOLN: 0.0000 ug/min | INTRAVENOUS | Status: DC

## 2014-03-28 MED ORDER — MORPHINE SULFATE 2 MG/ML IJ SOLN
2.0000 mg | INTRAMUSCULAR | Status: DC | PRN
  Administered 2014-03-29 (×4): 2 mg via INTRAVENOUS
  Filled 2014-03-28: qty 1
  Filled 2014-03-28 (×2): qty 2
  Filled 2014-03-28 (×2): qty 1

## 2014-03-28 MED ORDER — DEXTROSE 5 % IV SOLN
1.5000 g | Freq: Two times a day (BID) | INTRAVENOUS | Status: AC
  Administered 2014-03-28 – 2014-03-30 (×4): 1.5 g via INTRAVENOUS
  Filled 2014-03-28 (×4): qty 1.5

## 2014-03-28 MED FILL — Lidocaine HCl IV Inj 20 MG/ML: INTRAVENOUS | Qty: 5 | Status: AC

## 2014-03-28 MED FILL — Electrolyte-R (PH 7.4) Solution: INTRAVENOUS | Qty: 3000 | Status: AC

## 2014-03-28 MED FILL — Heparin Sodium (Porcine) Inj 1000 Unit/ML: INTRAMUSCULAR | Qty: 10 | Status: AC

## 2014-03-28 MED FILL — Sodium Chloride IV Soln 0.9%: INTRAVENOUS | Qty: 2000 | Status: AC

## 2014-03-28 MED FILL — Sodium Bicarbonate IV Soln 8.4%: INTRAVENOUS | Qty: 50 | Status: AC

## 2014-03-28 MED FILL — Mannitol IV Soln 20%: INTRAVENOUS | Qty: 500 | Status: AC

## 2014-03-28 SURGICAL SUPPLY — 119 items
ADAPTER CARDIO PERF ANTE/RETRO (ADAPTER) IMPLANT
APPLIER CLIP 9.375 MED OPEN (MISCELLANEOUS)
APPLIER CLIP 9.375 SM OPEN (CLIP) ×3
ATTRACTOMAT 16X20 MAGNETIC DRP (DRAPES) ×3 IMPLANT
BAG DECANTER FOR FLEXI CONT (MISCELLANEOUS) ×3 IMPLANT
BANDAGE ELASTIC 4 VELCRO ST LF (GAUZE/BANDAGES/DRESSINGS) ×6 IMPLANT
BANDAGE ELASTIC 6 VELCRO ST LF (GAUZE/BANDAGES/DRESSINGS) ×6 IMPLANT
BASKET HEART (ORDER IN 25'S) (MISCELLANEOUS) ×1
BASKET HEART (ORDER IN 25S) (MISCELLANEOUS) ×2 IMPLANT
BENZOIN TINCTURE PRP APPL 2/3 (GAUZE/BANDAGES/DRESSINGS) ×3 IMPLANT
BLADE STERNUM SYSTEM 6 (BLADE) ×3 IMPLANT
BLADE SURG ROTATE 9660 (MISCELLANEOUS) IMPLANT
BNDG GAUZE ELAST 4 BULKY (GAUZE/BANDAGES/DRESSINGS) ×6 IMPLANT
CANISTER SUCTION 2500CC (MISCELLANEOUS) ×3 IMPLANT
CANNULA EZ GLIDE AORTIC 21FR (CANNULA) ×6 IMPLANT
CANNULA GUNDRY RCSP 15FR (MISCELLANEOUS) IMPLANT
CANNULA VENOUS LOW PROF 34X46 (CANNULA) ×3 IMPLANT
CARDIAC SUCTION (MISCELLANEOUS) ×3 IMPLANT
CATH CPB KIT OWEN (MISCELLANEOUS) ×3 IMPLANT
CATH THORACIC 28FR (CATHETERS) IMPLANT
CATH THORACIC 28FR RT ANG (CATHETERS) IMPLANT
CATH THORACIC 36FR (CATHETERS) ×3 IMPLANT
CATH THORACIC 36FR RT ANG (CATHETERS) ×3 IMPLANT
CLIP APPLIE 9.375 MED OPEN (MISCELLANEOUS) IMPLANT
CLIP APPLIE 9.375 SM OPEN (CLIP) ×2 IMPLANT
CLIP FOGARTY SPRING 6M (CLIP) IMPLANT
CLIP TI MEDIUM 24 (CLIP) IMPLANT
CLIP TI WIDE RED SMALL 24 (CLIP) IMPLANT
CONN Y 3/8X3/8X3/8  BEN (MISCELLANEOUS)
CONN Y 3/8X3/8X3/8 BEN (MISCELLANEOUS) IMPLANT
COVER SURGICAL LIGHT HANDLE (MISCELLANEOUS) ×3 IMPLANT
CRADLE DONUT ADULT HEAD (MISCELLANEOUS) ×3 IMPLANT
DRAIN CHANNEL 32F RND 10.7 FF (WOUND CARE) ×3 IMPLANT
DRAPE CARDIOVASCULAR INCISE (DRAPES) ×1
DRAPE INCISE IOBAN 66X45 STRL (DRAPES) ×3 IMPLANT
DRAPE SLUSH/WARMER DISC (DRAPES) ×3 IMPLANT
DRAPE SRG 135X102X78XABS (DRAPES) ×2 IMPLANT
DRSG COVADERM 4X14 (GAUZE/BANDAGES/DRESSINGS) ×3 IMPLANT
DRSG COVADERM 4X8 (GAUZE/BANDAGES/DRESSINGS) ×3 IMPLANT
ELECT REM PT RETURN 9FT ADLT (ELECTROSURGICAL) ×6
ELECTRODE REM PT RTRN 9FT ADLT (ELECTROSURGICAL) ×4 IMPLANT
GAUZE SPONGE 4X4 12PLY STRL (GAUZE/BANDAGES/DRESSINGS) ×6 IMPLANT
GLOVE BIO SURGEON STRL SZ 6 (GLOVE) ×12 IMPLANT
GLOVE BIO SURGEON STRL SZ 6.5 (GLOVE) ×12 IMPLANT
GLOVE BIO SURGEON STRL SZ7 (GLOVE) ×3 IMPLANT
GLOVE BIO SURGEON STRL SZ7.5 (GLOVE) IMPLANT
GLOVE BIOGEL PI IND STRL 6 (GLOVE) IMPLANT
GLOVE BIOGEL PI IND STRL 6.5 (GLOVE) IMPLANT
GLOVE BIOGEL PI IND STRL 7.0 (GLOVE) ×4 IMPLANT
GLOVE BIOGEL PI INDICATOR 6 (GLOVE)
GLOVE BIOGEL PI INDICATOR 6.5 (GLOVE)
GLOVE BIOGEL PI INDICATOR 7.0 (GLOVE) ×2
GLOVE EUDERMIC 7 POWDERFREE (GLOVE) IMPLANT
GLOVE ORTHO TXT STRL SZ7.5 (GLOVE) ×6 IMPLANT
GOWN STRL REUS W/ TWL LRG LVL3 (GOWN DISPOSABLE) ×8 IMPLANT
GOWN STRL REUS W/TWL LRG LVL3 (GOWN DISPOSABLE) ×4
HEMOSTAT POWDER SURGIFOAM 1G (HEMOSTASIS) ×9 IMPLANT
INSERT FOGARTY 61MM (MISCELLANEOUS) IMPLANT
INSERT FOGARTY XLG (MISCELLANEOUS) ×3 IMPLANT
KIT BASIN OR (CUSTOM PROCEDURE TRAY) ×3 IMPLANT
KIT ROOM TURNOVER OR (KITS) ×3 IMPLANT
KIT SUCTION CATH 14FR (SUCTIONS) ×12 IMPLANT
KIT VASOVIEW W/TROCAR VH 2000 (KITS) ×3 IMPLANT
LEAD PACING MYOCARDI (MISCELLANEOUS) ×3 IMPLANT
MARKER GRAFT CORONARY BYPASS (MISCELLANEOUS) ×9 IMPLANT
NS IRRIG 1000ML POUR BTL (IV SOLUTION) ×15 IMPLANT
PACK OPEN HEART (CUSTOM PROCEDURE TRAY) ×3 IMPLANT
PAD ARMBOARD 7.5X6 YLW CONV (MISCELLANEOUS) ×3 IMPLANT
PAD ELECT DEFIB RADIOL ZOLL (MISCELLANEOUS) ×3 IMPLANT
PENCIL BUTTON HOLSTER BLD 10FT (ELECTRODE) ×3 IMPLANT
PUNCH AORTIC ROTATE 4.0MM (MISCELLANEOUS) ×3 IMPLANT
PUNCH AORTIC ROTATE 4.5MM 8IN (MISCELLANEOUS) IMPLANT
PUNCH AORTIC ROTATE 5MM 8IN (MISCELLANEOUS) IMPLANT
SET CARDIOPLEGIA MPS 5001102 (MISCELLANEOUS) ×3 IMPLANT
SOLUTION ANTI FOG 6CC (MISCELLANEOUS) IMPLANT
SPONGE GAUZE 4X4 12PLY STER LF (GAUZE/BANDAGES/DRESSINGS) ×9 IMPLANT
SPONGE LAP 18X18 X RAY DECT (DISPOSABLE) IMPLANT
SPONGE LAP 4X18 X RAY DECT (DISPOSABLE) ×3 IMPLANT
SUT BONE WAX W31G (SUTURE) ×3 IMPLANT
SUT ETHIBOND X763 2 0 SH 1 (SUTURE) ×6 IMPLANT
SUT MNCRL AB 3-0 PS2 18 (SUTURE) ×6 IMPLANT
SUT MNCRL AB 4-0 PS2 18 (SUTURE) ×3 IMPLANT
SUT PDS AB 1 CTX 36 (SUTURE) ×6 IMPLANT
SUT PROLENE 2 0 SH DA (SUTURE) IMPLANT
SUT PROLENE 3 0 SH DA (SUTURE) ×3 IMPLANT
SUT PROLENE 3 0 SH1 36 (SUTURE) IMPLANT
SUT PROLENE 4 0 RB 1 (SUTURE) ×3
SUT PROLENE 4 0 SH DA (SUTURE) IMPLANT
SUT PROLENE 4-0 RB1 .5 CRCL 36 (SUTURE) ×6 IMPLANT
SUT PROLENE 5 0 C 1 36 (SUTURE) IMPLANT
SUT PROLENE 6 0 C 1 30 (SUTURE) IMPLANT
SUT PROLENE 7.0 RB 3 (SUTURE) ×24 IMPLANT
SUT PROLENE 8 0 BV175 6 (SUTURE) IMPLANT
SUT PROLENE BLUE 7 0 (SUTURE) ×6 IMPLANT
SUT PROLENE POLY MONO (SUTURE) ×6 IMPLANT
SUT SILK  1 MH (SUTURE) ×1
SUT SILK 1 MH (SUTURE) ×2 IMPLANT
SUT STEEL 6MS V (SUTURE) IMPLANT
SUT STEEL STERNAL CCS#1 18IN (SUTURE) IMPLANT
SUT STEEL SZ 6 DBL 3X14 BALL (SUTURE) IMPLANT
SUT VIC AB 1 CTX 36 (SUTURE)
SUT VIC AB 1 CTX36XBRD ANBCTR (SUTURE) IMPLANT
SUT VIC AB 2-0 CT1 27 (SUTURE)
SUT VIC AB 2-0 CT1 36 (SUTURE) ×3 IMPLANT
SUT VIC AB 2-0 CT1 TAPERPNT 27 (SUTURE) IMPLANT
SUT VIC AB 2-0 CTX 27 (SUTURE) IMPLANT
SUT VIC AB 3-0 SH 27 (SUTURE)
SUT VIC AB 3-0 SH 27X BRD (SUTURE) IMPLANT
SUT VIC AB 3-0 X1 27 (SUTURE) IMPLANT
SUT VICRYL 4-0 PS2 18IN ABS (SUTURE) IMPLANT
SUTURE E-PAK OPEN HEART (SUTURE) ×3 IMPLANT
SYSTEM SAHARA CHEST DRAIN ATS (WOUND CARE) ×3 IMPLANT
TAPE CLOTH SURG 4X10 WHT LF (GAUZE/BANDAGES/DRESSINGS) ×3 IMPLANT
TOWEL OR 17X24 6PK STRL BLUE (TOWEL DISPOSABLE) ×6 IMPLANT
TOWEL OR 17X26 10 PK STRL BLUE (TOWEL DISPOSABLE) ×6 IMPLANT
TRAY FOLEY IC TEMP SENS 16FR (CATHETERS) ×3 IMPLANT
TUBING INSUFFLATION (TUBING) ×3 IMPLANT
UNDERPAD 30X30 INCONTINENT (UNDERPADS AND DIAPERS) ×3 IMPLANT
WATER STERILE IRR 1000ML POUR (IV SOLUTION) ×6 IMPLANT

## 2014-03-28 NOTE — Brief Op Note (Addendum)
03/26/2014 - 03/28/2014  11:21 AM  PATIENT:  [name] [name]  63 y.o. male  PRE-OPERATIVE DIAGNOSIS:  CAD  POST-OPERATIVE DIAGNOSIS:  CAD  PROCEDURE:   CORONARY ARTERY BYPASS GRAFTING x 4 (LIMA-LAD, SVG-D, SVG-Cx, SVG-PD) ENDOSCOPIC VEIN HARVEST RIGHT LEG AND LEFT THIGH  SURGEON:  Rexene Alberts, MD  ASSISTANT: Suzzanne Cloud, PA-C  ANESTHESIA:   Kate Sable, MD  CROSSCLAMP TIME:   25'  CARDIOPULMONARY BYPASS TIME: 126'  FINDINGS:  Mild-moderate LV systolic dysfunction with inferior and lateral wall hypokinesis  Diffuse CAD with relatively poor target vessels for grafting  Small caliber but otherwise good quality LIMA conduit for grafting  Small caliber and fair quality SVG conduit for grafting  COMPLICATIONS: None  BASELINE WEIGHT: 84 kg  PATIENT DISPOSITION:   TO SICU IN STABLE CONDITION  OWEN,CLARENCE H 03/28/2014 12:41 PM

## 2014-03-28 NOTE — Procedures (Signed)
Extubation Procedure Note  Patient Details:   Name: [name] [name] DOB: 13-Jul-1950 MRN: [id number]   Airway Documentation: Patient extubated to 4 lpm nasal cannula.  VC 1300 ml, NIF -25, patient able to lift head off bed.  Patient able to breathe around deflated cuff and vocalize post procedure.  Tolerated well, no complications.    Evaluation  O2 sats: stable throughout Complications: No apparent complications Patient did tolerate procedure well. Bilateral Breath Sounds: Clear;Diminished Suctioning: Oral;Airway Yes  [name] 03/28/2014, 11:14 PM

## 2014-03-28 NOTE — Op Note (Signed)
CARDIOTHORACIC SURGERY OPERATIVE NOTE  Date of Procedure: 03/28/2014  Preoperative Diagnosis:   Severe 3-vessel Coronary Artery Disease  S/P acute non-STEMI  Postoperative Diagnosis: Same  Procedure:    Coronary Artery Bypass Grafting x 4   Left Internal Mammary Artery to Distal Left Anterior Descending Coronary Artery  Saphenous Vein Graft to Posterior Descending Coronary Artery  Saphenous Vein Graft to Obtuse Marginal Branch of Left Circumflex Coronary Artery  Sapheonous Vein Graft to Diagonal Branch Coronary Artery  Endoscopic Vein Harvest from Bilateral Thighs and Right Lower Leg  Surgeon: Valentina Gu. Roxy Manns, MD  Assistant: Suzzanne Cloud, PA-C  Anesthesia: Kate Sable, MD  Operative Findings: Mild-moderate LV systolic dysfunction with inferior and lateral wall hypokinesis  Diffuse CAD with relatively poor target vessels for grafting  Small caliber but otherwise good quality LIMA conduit for grafting  Small caliber and  fair quality SVG conduit for grafting     BRIEF CLINICAL NOTE AND INDICATIONS FOR SURGERY  Patient is a 63 year old white male with no previous history of coronary artery disease but risk factors notable for history of hypertension, hyperlipidemia, and long-standing tobacco abuse. The patient describes a one year history of progressive symptoms of exertional fatigue and subsequent chest discomfort. Symptoms of chest discomfort are described as dull, sometimes burning substernal chest pain that frequently occurs after eating a large meal and also occurs with strenuous physical activity. Symptoms have been associated with decreased energy and progressive exertional fatigue without shortness of breath. Approximately one week ago he developed more severe substernal chest discomfort associated with diaphoresis while he was playing golf. Symptoms eventually subsided after 40 minutes. Several days ago he developed similar chest discomfort that awoke him from his sleep  in the morning. Symptoms persisted all day, ultimately causing the patient to seek evaluation in his primary care physician's office. Electrocardiogram did not reveal acute ischemic changes, but serum troponin level was elevated at 12. The patient was sent to the emergency department at [facility] where he was admitted to the hospital and subsequently ruled in for acute non-ST segment elevation myocardial infarction. Symptoms of chest discomfort resolved with nitrates and intravenous heparin. He was evaluated by Dr. Rockey Situ and underwent diagnostic heart catheterization 03/25/2014. This revealed severe three-vessel coronary artery disease with severe left ventricular dysfunction. The patient was transferred to [facility] for surgical evaluation.  The patient has been seen in consultation and counseled at length regarding the indications, risks and potential benefits of surgery.  All questions have been answered, and the patient provides full informed consent for the operation as described.     DETAILS OF THE OPERATIVE PROCEDURE  Preparation:  The patient is brought to the operating room on the above mentioned date and central monitoring was established by the anesthesia team including placement of Swan-Ganz catheter and radial arterial line. The patient is placed in the supine position on the operating table.  Intravenous antibiotics are administered. General endotracheal anesthesia is induced uneventfully. A Foley catheter is placed.  Baseline transesophageal echocardiogram was performed.  Findings were notable for mild to moderate LV systolic dysfunction with hypokinesis of inferior and posterolateral wall.  There was mild (1+) central mitral regurgitation.  The patient's chest, abdomen, both groins, and both lower extremities are prepared and draped in a sterile manner. A time out procedure is performed.   Surgical Approach and Conduit Harvest:  A median sternotomy incision was performed and the left  internal mammary artery is dissected from the chest wall and prepared for bypass grafting. The left  internal mammary artery is notably good quality conduit. Simultaneously, saphenous vein is obtained from the patient's right thigh and leg using endoscopic vein harvest technique. The saphenous vein is notably small caliber and fair quality conduit. An additional segment of saphenous vein is removed from the patient's left thigh using endoscopic vein harvest technique.  After removal of the saphenous vein, the small surgical incisions in the lower extremity are closed with absorbable suture. Following systemic heparinization, the left internal mammary artery was transected distally noted to have excellent flow.   Extracorporeal Cardiopulmonary Bypass and Myocardial Protection:  The pericardium is opened. The ascending aorta is normal in appearance. The ascending aorta and the right atrium are cannulated for cardioplegia bypass.  Adequate heparinization is verified.   A retrograde cardioplegia cannula is placed through the right atrium into the coronary sinus.  The entire pre-bypass portion of the operation was notable for stable hemodynamics.  Cardiopulmonary bypass was begun and the surface of the heart is inspected. Distal target vessels are selected for coronary artery bypass grafting. A cardioplegia cannula is placed in the ascending aorta.  A temperature probe was placed in the interventricular septum.  The patient is allowed to cool passively to [facility] systemic temperature.  The aortic cross clamp is applied and cold blood cardioplegia is delivered initially in an antegrade fashion through the aortic root.   Supplemental cardioplegia is given retrograde through the coronary sinus catheter.  Iced saline slush is applied for topical hypothermia.  The initial cardioplegic arrest is rapid with early diastolic arrest.  Repeat doses of cardioplegia are administered intermittently throughout the entire cross  clamp portion of the operation through the aortic root, through the coronary sinus catheter, and through subsequently placed vein grafts in order to maintain completely flat electrocardiogram and septal myocardial temperature below 15C.  Myocardial protection was felt to be excellent.  Coronary Artery Bypass Grafting:   The posterior descending branch of the right coronary artery was grafted using a reversed saphenous vein graft in an end-to-side fashion.  At the site of distal anastomosis the target vessel was fair to good quality and measured approximately 1.5 mm in diameter.  The obtuse marginal branch of the left circumflex coronary artery was grafted using a reversed saphenous vein graft in an end-to-side fashion.  At the site of distal anastomosis the target vessel was very poor quality and measured approximately 1.0 mm in diameter.  The vessel was chronically occluded.  The diagonal branch of the left anterior descending coronary artery was grafted using a reversed saphenous vein graft in an end-to-side fashion.  At the site of distal anastomosis the target vessel was fair quality and measured approximately 1.2 mm in diameter.  The distal left anterior coronary artery was grafted with the left internal mammary artery in an end-to-side fashion.  At the site of distal anastomosis the target vessel was fair quality and measured approximately 1.2 mm in diameter.  All proximal vein graft anastomoses were placed directly to the ascending aorta prior to removal of the aortic cross clamp.  The septal myocardial temperature rose rapidly after reperfusion of the left internal mammary artery graft.  The aortic cross clamp was removed after a total cross clamp time of 89 minutes.   Procedure Completion:  All proximal and distal coronary anastomoses were inspected for hemostasis and appropriate graft orientation. Epicardial pacing wires are fixed to the right ventricular outflow tract and to the right  atrial appendage. The patient is rewarmed to 37C temperature. The patient is  weaned and disconnected from cardiopulmonary bypass.  The patient's rhythm at separation from bypass was sinus.  Upon initial attempt to wean from bypass the patient developed transient ST segment elevation and global LV dysfunction consistent with intracoronary air.  The patient was rested on bypass for an additional 14 minutes.  ST segments returned to normal.  The patient was weaned from cardioplegic bypass on low dose milrinone and dopamine infusions. Total cardiopulmonary bypass time for the operation was 126 minutes.  Followup transesophageal echocardiogram performed after separation from bypass revealed no changes from the preoperative exam.  The aortic and venous cannula were removed uneventfully. Protamine was administered to reverse the anticoagulation. The mediastinum and pleural space were inspected for hemostasis and irrigated with saline solution. The mediastinum and the left pleural space were drained using 3 chest tubes placed through separate stab incisions inferiorly.  The soft tissues anterior to the aorta were reapproximated loosely. The sternum is closed with double strength sternal wire. The soft tissues anterior to the sternum were closed in multiple layers and the skin is closed with a running subcuticular skin closure.  The post-bypass portion of the operation was notable for stable rhythm and hemodynamics.  No blood products were administered during the operation.   Disposition:  The patient tolerated the procedure well and is transported to the surgical intensive care in stable condition. There are no intraoperative complications. All sponge instrument and needle counts are verified correct at completion of the operation.    Valentina Gu. Roxy Manns MD 03/28/2014 12:47 PM

## 2014-03-28 NOTE — Progress Notes (Signed)
Recruitment maneuver performed due to RLL collapse on CXR.  Pressure Control Pressure 20, R 10, Peep +5, 100% x2 minutes. Pt's spo2 increased to 100% on monitor.  Pt Tolerated with no complications.   RN aware.

## 2014-03-28 NOTE — Anesthesia Postprocedure Evaluation (Signed)
  Anesthesia Post-op Note  Patient: [name] [name]  Procedure(s) Performed: Procedure(s): CORONARY ARTERY BYPASS GRAFTING (CABG) x  4 using left internal mammary artery and bilateral saphenous leg vein. (N/A) INTRAOPERATIVE TRANSESOPHAGEAL ECHOCARDIOGRAM (N/A)  Patient Location: SICU  Anesthesia Type:General  Level of Consciousness: sedated and Patient remains intubated per anesthesia plan  Airway and Oxygen Therapy: Patient remains intubated per anesthesia plan and Patient placed on Ventilator (see vital sign flow sheet for setting)  Post-op Pain: none  Post-op Assessment: Post-op Vital signs reviewed, Patient's Cardiovascular Status Stable, Respiratory Function Stable, Patent Airway, No signs of Nausea or vomiting and Pain level controlled  Post-op Vital Signs: stable  Last Vitals:  Filed Vitals:   03/28/14 1415  BP: 87/53  Pulse: 82  Temp: 36.7 C  Resp: 12    Complications: No apparent anesthesia complications

## 2014-03-28 NOTE — Transfer of Care (Signed)
Immediate Anesthesia Transfer of Care Note  Patient: [name] [name]  Procedure(s) Performed: Procedure(s): CORONARY ARTERY BYPASS GRAFTING (CABG) x  4 using left internal mammary artery and bilateral saphenous leg vein. (N/A) INTRAOPERATIVE TRANSESOPHAGEAL ECHOCARDIOGRAM (N/A)  Patient Location: SICU  Anesthesia Type:General  Level of Consciousness: sedated and Patient remains intubated per anesthesia plan  Airway & Oxygen Therapy: Patient remains intubated per anesthesia plan and Patient placed on Ventilator (see vital sign flow sheet for setting)  Post-op Assessment: Report given to PACU RN and Post -op Vital signs reviewed and stable  Post vital signs: Reviewed and stable  Complications: No apparent anesthesia complications

## 2014-03-28 NOTE — Anesthesia Preprocedure Evaluation (Signed)
Anesthesia Evaluation  Patient identified by MRN, date of birth, ID band Patient awake    Reviewed: Allergy & Precautions, H&P , NPO status , Patient's Chart, lab work & pertinent test results  Airway       Dental   Pulmonary Current Smoker,          Cardiovascular hypertension, + angina + CAD and + Past MI     Neuro/Psych    GI/Hepatic GERD-  ,  Endo/Other    Renal/GU      Musculoskeletal   Abdominal   Peds  Hematology   Anesthesia Other Findings   Reproductive/Obstetrics                           Anesthesia Physical Anesthesia Plan  ASA: IV  Anesthesia Plan: General   Post-op Pain Management:    Induction: Intravenous  Airway Management Planned: Oral ETT  Additional Equipment: Arterial line, CVP, PA Cath and TEE  Intra-op Plan:   Post-operative Plan: Post-operative intubation/ventilation  Informed Consent: I have reviewed the patients History and Physical, chart, labs and discussed the procedure including the risks, benefits and alternatives for the proposed anesthesia with the patient or authorized representative who has indicated his/her understanding and acceptance.     Plan Discussed with: CRNA, Anesthesiologist and Surgeon  Anesthesia Plan Comments:         Anesthesia Quick Evaluation

## 2014-03-28 NOTE — Anesthesia Procedure Notes (Signed)
Procedure Name: Intubation Date/Time: 03/28/2014 7:45 AM Performed by: Susa Loffler Pre-anesthesia Checklist: Patient identified, Timeout performed, Emergency Drugs available, Suction available and Patient being monitored Patient Re-evaluated:Patient Re-evaluated prior to inductionOxygen Delivery Method: Circle system utilized Preoxygenation: Pre-oxygenation with 100% oxygen Intubation Type: IV induction Ventilation: Mask ventilation without difficulty and Oral airway inserted - appropriate to patient size Laryngoscope Size: Mac and 4 Grade View: Grade II Tube type: Oral Tube size: 8.0 mm Number of attempts: 1 Airway Equipment and Method: Stylet and Oral airway Placement Confirmation: ETT inserted through vocal cords under direct vision,  positive ETCO2 and breath sounds checked- equal and bilateral Secured at: 23 cm Tube secured with: Tape Dental Injury: Teeth and Oropharynx as per pre-operative assessment

## 2014-03-28 NOTE — Progress Notes (Signed)
S/p CABG  Intubated, sedated, just starting to stir  BP 104/66  Pulse 74  Temp(Src) 99.5 F (37.5 C) (Oral)  Resp 16  Ht 6' (1.829 m)  Wt 186 lb 1.1 oz (84.4 kg)  BMI 25.23 kg/m2  SpO2 100%  37/22 CI= 2.4  Minimal CT output   Intake/Output Summary (Last 24 hours) at 03/28/14 1723 Last data filed at 03/28/14 1720  Gross per 24 hour  Intake 5159.84 ml  Output   4105 ml  Net 1054.84 ml    Doing well early postop

## 2014-03-28 NOTE — Progress Notes (Signed)
  Echocardiogram Echocardiogram Transesophageal has been performed.  [name] [name] FRANCES 03/28/2014, 9:16 AM

## 2014-03-29 ENCOUNTER — Inpatient Hospital Stay (HOSPITAL_COMMUNITY): Payer: BC Managed Care – PPO

## 2014-03-29 LAB — BASIC METABOLIC PANEL
Anion gap: 13 (ref 5–15)
BUN: 10 mg/dL (ref 6–23)
CHLORIDE: 106 meq/L (ref 96–112)
CO2: 21 meq/L (ref 19–32)
CREATININE: 0.75 mg/dL (ref 0.50–1.35)
Calcium: 7.4 mg/dL — ABNORMAL LOW (ref 8.4–10.5)
GFR calc Af Amer: >90 mL/min (ref >90)
GFR calc non Af Amer: >90 mL/min (ref >90)
GLUCOSE: 132 mg/dL — AB (ref 70–99)
Potassium: 4.5 mEq/L (ref 3.7–5.3)
Sodium: 140 mEq/L (ref 137–147)

## 2014-03-29 LAB — POCT I-STAT, CHEM 8
BUN: 9 mg/dL (ref 6–23)
CREATININE: 0.6 mg/dL (ref 0.50–1.35)
Calcium, Ion: 1.1 mmol/L — ABNORMAL LOW (ref 1.13–1.30)
Chloride: 102 mEq/L (ref 96–112)
Glucose, Bld: 127 mg/dL — ABNORMAL HIGH (ref 70–99)
HCT: 34 % — ABNORMAL LOW (ref 39.0–52.0)
HEMOGLOBIN: 11.6 g/dL — AB (ref 13.0–17.0)
Potassium: 4 mEq/L (ref 3.7–5.3)
SODIUM: 134 meq/L — AB (ref 137–147)
TCO2: 19 mmol/L (ref 0–100)

## 2014-03-29 LAB — CBC
HCT: 31.1 % — ABNORMAL LOW (ref 39.0–52.0)
HCT: 32.4 % — ABNORMAL LOW (ref 39.0–52.0)
HEMOGLOBIN: 10.8 g/dL — AB (ref 13.0–17.0)
Hemoglobin: 11.3 g/dL — ABNORMAL LOW (ref 13.0–17.0)
MCH: 31.5 pg (ref 26.0–34.0)
MCH: 30.9 pg (ref 26.0–34.0)
MCHC: 34.7 g/dL (ref 30.0–36.0)
MCHC: 34.9 g/dL (ref 30.0–36.0)
MCV: 90.7 fL (ref 78.0–100.0)
MCV: 88.5 fL (ref 78.0–100.0)
PLATELETS: 134 10*3/uL — AB (ref 150–400)
Platelets: 131 10*3/uL — ABNORMAL LOW (ref 150–400)
RBC: 3.43 MIL/uL — AB (ref 4.22–5.81)
RBC: 3.66 MIL/uL — AB (ref 4.22–5.81)
RDW: 13.3 % (ref 11.5–15.5)
RDW: 13.4 % (ref 11.5–15.5)
WBC: 13.1 10*3/uL — AB (ref 4.0–10.5)
WBC: 13.6 10*3/uL — AB (ref 4.0–10.5)

## 2014-03-29 LAB — POCT I-STAT 3, ART BLOOD GAS (G3+)
Acid-base deficit: 1 mmol/L (ref 0.0–2.0)
Bicarbonate: 23.6 mEq/L (ref 20.0–24.0)
O2 Saturation: 97 %
PH ART: 7.394 (ref 7.350–7.450)
Patient temperature: 37.9
TCO2: 25 mmol/L (ref 0–100)
pCO2 arterial: 39 mmHg (ref 35.0–45.0)
pO2, Arterial: 93 mmHg (ref 80.0–100.0)

## 2014-03-29 LAB — GLUCOSE, CAPILLARY
GLUCOSE-CAPILLARY: 75 mg/dL (ref 70–99)
GLUCOSE-CAPILLARY: 145 mg/dL — AB (ref 70–99)
Glucose-Capillary: 105 mg/dL — ABNORMAL HIGH (ref 70–99)
Glucose-Capillary: 127 mg/dL — ABNORMAL HIGH (ref 70–99)
Glucose-Capillary: 150 mg/dL — ABNORMAL HIGH (ref 70–99)
Glucose-Capillary: 92 mg/dL (ref 70–99)
Glucose-Capillary: 139 mg/dL — ABNORMAL HIGH (ref 70–99)
Glucose-Capillary: 163 mg/dL — ABNORMAL HIGH (ref 70–99)

## 2014-03-29 LAB — CREATININE, SERUM: Creatinine, Ser: 0.66 mg/dL (ref 0.50–1.35)

## 2014-03-29 LAB — MAGNESIUM
MAGNESIUM: 2 mg/dL (ref 1.5–2.5)
Magnesium: 2.2 mg/dL (ref 1.5–2.5)

## 2014-03-29 MED ORDER — INSULIN ASPART 100 UNIT/ML ~~LOC~~ SOLN
0.0000 [IU] | SUBCUTANEOUS | Status: DC
  Administered 2014-03-29: 4 [IU] via SUBCUTANEOUS
  Administered 2014-03-29: 2 [IU] via SUBCUTANEOUS

## 2014-03-29 MED ORDER — INSULIN ASPART 100 UNIT/ML ~~LOC~~ SOLN: 0.0000 [IU] | SUBCUTANEOUS | Status: DC

## 2014-03-29 MED ORDER — ENOXAPARIN SODIUM 40 MG/0.4ML ~~LOC~~ SOLN
40.0000 mg | Freq: Every day | SUBCUTANEOUS | Status: DC
  Administered 2014-03-29 – 2014-03-30 (×2): 40 mg via SUBCUTANEOUS
  Filled 2014-03-29 (×3): qty 0.4

## 2014-03-29 MED ORDER — ALBUTEROL SULFATE (2.5 MG/3ML) 0.083% IN NEBU: 2.5000 mg | INHALATION_SOLUTION | RESPIRATORY_TRACT | Status: DC | PRN

## 2014-03-29 NOTE — Progress Notes (Signed)
1 Day Post-Op Procedure(s) (LRB): CORONARY ARTERY BYPASS GRAFTING (CABG) x  4 using left internal mammary artery and bilateral saphenous leg vein. (N/A) INTRAOPERATIVE TRANSESOPHAGEAL ECHOCARDIOGRAM (N/A) Subjective: Some incisional pain Denies nausea   Objective: Vital signs in last 24 hours: Temp:  [98.1 F (36.7 [name])-100.8 F (38.2 [name])] 99.9 F (37.7 [name]) (09/19 0900) Pulse Rate:  [73-90] 82 (09/19 0900) Cardiac Rhythm:  [-] Normal sinus rhythm (09/19 0700) Resp:  [4-26] 21 (09/19 0900) BP: (75-137)/(48-84) 104/65 mmHg (09/19 0900) SpO2:  [95 %-100 %] 96 % (09/19 0900) Arterial Line BP: (71-174)/(40-83) 116/55 mmHg (09/19 0900) FiO2 (%):  [40 %-50 %] 40 % (09/18 2140)  Hemodynamic parameters for last 24 hours: PAP: (22-42)/(11-23) 28/13 mmHg CO:  [4.7 L/min-7.5 L/min] 7.5 L/min CI:  [2.3 L/min/m2-3.7 L/min/m2] 3.6 L/min/m2  Intake/Output from previous day: 09/18 0701 - 09/19 0700 In: 7564.8 [P.O.:120; I.V.:5711.8; Blood:473; NG/GT:60; IV Piggyback:1200] Out: 9371 [Urine:2215; Emesis/NG output:100; Blood:900; Chest Tube:540] Intake/Output this shift: Total I/O In: 172.6 [I.V.:122.6; IV Piggyback:50] Out: 60 [Urine:30; Chest Tube:30]  General appearance: alert and no distress Neurologic: intact Heart: regular rate and rhythm Lungs: diminished breath sounds bibasilar Abdomen: mildly distended, nontender  Lab Results:  Recent Labs  03/28/14 1855 03/28/14 1900 03/29/14 0353  WBC 15.0*  --  13.1*  HGB 11.3* 11.2* 10.8*  HCT 32.3* 33.0* 31.1*  PLT 132*  --  131*   BMET:  Recent Labs  03/27/14 0230  03/28/14 1900 03/29/14 0353  NA 139  < > 137 140  K 3.8  < > 4.4 4.5  CL 101  < > 105 106  CO2 24  --   --  21  GLUCOSE 101*  < > 143* 132*  BUN 12  < > 8 10  CREATININE 0.82  < > 0.60 0.75  CALCIUM 8.5  --   --  7.4*  < > = values in this interval not displayed.  PT/INR:  Recent Labs  03/28/14 1330  LABPROT 17.9*  INR 1.48   ABG    Component Value  Date/Time   PHART 7.394 03/29/2014 0006   HCO3 23.6 03/29/2014 0006   TCO2 25 03/29/2014 0006   ACIDBASEDEF 1.0 03/29/2014 0006   O2SAT 97.0 03/29/2014 0006   CBG (last 3)   Recent Labs  03/28/14 1704 03/28/14 2019 03/29/14 0001  GLUCAP 105* 150* 127*    Assessment/Plan: S/P Procedure(s) (LRB): CORONARY ARTERY BYPASS GRAFTING (CABG) x  4 using left internal mammary artery and bilateral saphenous leg vein. (N/A) INTRAOPERATIVE TRANSESOPHAGEAL ECHOCARDIOGRAM (N/A) POD # 1 CABG x 4 CV- cardiac index = 3.6- wean milrinone  Dc swan after milrinone off  RESP- IS  RENAL- weight at baseline, will hold off on diuresis for now  ENDO- CBG well controlled on Q4 CBG/ SSI  SCD + enoxaparin for DVT prophylaxis  DC CT  OOB to chair   LOS: 3 days    [name] [name],[name] [name] 03/29/2014

## 2014-03-29 NOTE — Progress Notes (Signed)
Up in chair  BP 121/70  Pulse 86  Temp(Src) 97.3 F (36.3 C) (Oral)  Resp 28  Ht 6' (1.829 m)  Wt 186 lb 1.1 oz (84.4 kg)  BMI 25.23 kg/m2  SpO2 98%   Intake/Output Summary (Last 24 hours) at 03/29/14 1755 Last data filed at 03/29/14 1600  Gross per 24 hour  Intake 3258.4 ml  Output   1360 ml  Net 1898.4 ml    K=4.0, Hct=34  Doing well POD # 1

## 2014-03-30 ENCOUNTER — Inpatient Hospital Stay (HOSPITAL_COMMUNITY): Payer: BC Managed Care – PPO

## 2014-03-30 LAB — CBC
HCT: 29.2 % — ABNORMAL LOW (ref 39.0–52.0)
Hemoglobin: 10.3 g/dL — ABNORMAL LOW (ref 13.0–17.0)
MCH: 31.3 pg (ref 26.0–34.0)
MCHC: 35.3 g/dL (ref 30.0–36.0)
MCV: 88.8 fL (ref 78.0–100.0)
PLATELETS: 135 10*3/uL — AB (ref 150–400)
RBC: 3.29 MIL/uL — ABNORMAL LOW (ref 4.22–5.81)
RDW: 13.2 % (ref 11.5–15.5)
WBC: 11.9 10*3/uL — ABNORMAL HIGH (ref 4.0–10.5)

## 2014-03-30 LAB — GLUCOSE, CAPILLARY
GLUCOSE-CAPILLARY: 108 mg/dL — AB (ref 70–99)
GLUCOSE-CAPILLARY: 139 mg/dL — AB (ref 70–99)
Glucose-Capillary: 102 mg/dL — ABNORMAL HIGH (ref 70–99)
Glucose-Capillary: 116 mg/dL — ABNORMAL HIGH (ref 70–99)
Glucose-Capillary: 116 mg/dL — ABNORMAL HIGH (ref 70–99)
Glucose-Capillary: 150 mg/dL — ABNORMAL HIGH (ref 70–99)
Glucose-Capillary: 96 mg/dL (ref 70–99)

## 2014-03-30 LAB — BASIC METABOLIC PANEL
Anion gap: 12 (ref 5–15)
BUN: 11 mg/dL (ref 6–23)
CO2: 24 mEq/L (ref 19–32)
CREATININE: 0.61 mg/dL (ref 0.50–1.35)
Calcium: 7.9 mg/dL — ABNORMAL LOW (ref 8.4–10.5)
Chloride: 100 mEq/L (ref 96–112)
Glucose, Bld: 106 mg/dL — ABNORMAL HIGH (ref 70–99)
Potassium: 3.8 mEq/L (ref 3.7–5.3)
Sodium: 136 mEq/L — ABNORMAL LOW (ref 137–147)

## 2014-03-30 MED ORDER — POTASSIUM CHLORIDE CRYS ER 20 MEQ PO TBCR
20.0000 meq | EXTENDED_RELEASE_TABLET | Freq: Two times a day (BID) | ORAL | Status: DC
  Administered 2014-03-30 – 2014-04-02 (×7): 20 meq via ORAL
  Filled 2014-03-30 (×8): qty 1

## 2014-03-30 MED ORDER — PNEUMOCOCCAL VAC POLYVALENT 25 MCG/0.5ML IJ INJ
0.5000 mL | INJECTION | INTRAMUSCULAR | Status: AC
  Administered 2014-04-02: 0.5 mL via INTRAMUSCULAR
  Filled 2014-03-30: qty 0.5

## 2014-03-30 MED ORDER — SODIUM CHLORIDE 0.9 % IJ SOLN
3.0000 mL | Freq: Two times a day (BID) | INTRAMUSCULAR | Status: DC
Start: 2014-03-30 — End: 2014-04-02
  Administered 2014-03-30: 3 mL via INTRAVENOUS
  Administered 2014-03-30: 10 mL via INTRAVENOUS
  Administered 2014-03-31 – 2014-04-01 (×3): 3 mL via INTRAVENOUS

## 2014-03-30 MED ORDER — INSULIN ASPART 100 UNIT/ML ~~LOC~~ SOLN
0.0000 [IU] | Freq: Three times a day (TID) | SUBCUTANEOUS | Status: DC
  Administered 2014-03-30: 13:00:00 via SUBCUTANEOUS

## 2014-03-30 MED ORDER — SODIUM CHLORIDE 0.9 % IJ SOLN: 3.0000 mL | INTRAMUSCULAR | Status: DC | PRN

## 2014-03-30 MED ORDER — SODIUM CHLORIDE 0.9 % IV SOLN: 250.0000 mL | INTRAVENOUS | Status: DC | PRN

## 2014-03-30 MED ORDER — SIMVASTATIN 20 MG PO TABS
20.0000 mg | ORAL_TABLET | Freq: Every day | ORAL | Status: DC
  Administered 2014-03-30 – 2014-04-01 (×3): 20 mg via ORAL
  Filled 2014-03-30 (×4): qty 1

## 2014-03-30 MED ORDER — INFLUENZA VAC SPLIT QUAD 0.5 ML IM SUSY
0.5000 mL | PREFILLED_SYRINGE | INTRAMUSCULAR | Status: AC
  Administered 2014-04-02: 0.5 mL via INTRAMUSCULAR
  Filled 2014-03-30: qty 0.5

## 2014-03-30 MED ORDER — MAGNESIUM HYDROXIDE 400 MG/5ML PO SUSP
30.0000 mL | Freq: Every day | ORAL | Status: DC | PRN
Start: 2014-03-30 — End: 2014-04-02

## 2014-03-30 MED ORDER — GUAIFENESIN-DM 100-10 MG/5ML PO SYRP: 15.0000 mL | ORAL_SOLUTION | ORAL | Status: DC | PRN

## 2014-03-30 MED ORDER — ALBUTEROL SULFATE (2.5 MG/3ML) 0.083% IN NEBU: 2.5000 mg | INHALATION_SOLUTION | Freq: Four times a day (QID) | RESPIRATORY_TRACT | Status: DC | PRN

## 2014-03-30 MED ORDER — MOVING RIGHT ALONG BOOK
Freq: Once | Status: AC
Start: 2014-03-30 — End: 2014-03-30
  Administered 2014-03-30: 10:00:00
  Filled 2014-03-30: qty 1

## 2014-03-30 MED ORDER — ALUM & MAG HYDROXIDE-SIMETH 200-200-20 MG/5ML PO SUSP: 15.0000 mL | ORAL | Status: DC | PRN

## 2014-03-30 MED ORDER — FUROSEMIDE 40 MG PO TABS
40.0000 mg | ORAL_TABLET | Freq: Every day | ORAL | Status: DC
  Administered 2014-03-30 – 2014-04-02 (×4): 40 mg via ORAL
  Filled 2014-03-30 (×4): qty 1

## 2014-03-30 NOTE — Progress Notes (Signed)
2 Days Post-Op Procedure(s) (LRB): CORONARY ARTERY BYPASS GRAFTING (CABG) x  4 using left internal mammary artery and bilateral saphenous leg vein. (N/A) INTRAOPERATIVE TRANSESOPHAGEAL ECHOCARDIOGRAM (N/A) Subjective: Some incisional discomfort, pain meds help  Objective: Vital signs in last 24 hours: Temp:  [97.3 F (36.3 [name])-99.9 F (37.7 [name])] 98.5 F (36.9 [name]) (09/20 0748) Pulse Rate:  [76-93] 82 (09/20 0800) Cardiac Rhythm:  [-] Normal sinus rhythm (09/20 0733) Resp:  [15-33] 15 (09/20 0800) BP: (94-122)/(51-77) 106/63 mmHg (09/20 0800) SpO2:  [93 %-98 %] 97 % (09/20 0800) Arterial Line BP: (111-135)/(50-60) 135/53 mmHg (09/19 1700) Weight:  [197 lb 5 oz (89.5 kg)] 197 lb 5 oz (89.5 kg) (09/20 0600)  Hemodynamic parameters for last 24 hours: PAP: (24-37)/(9-18) 37/18 mmHg CO:  [7.8 L/min] 7.8 L/min CI:  [3.8 L/min/m2] 3.8 L/min/m2  Intake/Output from previous day: 09/19 0701 - 09/20 0700 In: 1082.9 [P.O.:350; I.V.:632.9; IV Piggyback:100] Out: 1195 [Urine:1145; Chest Tube:50] Intake/Output this shift: Total I/O In: 50 [IV Piggyback:50] Out: 75 [Urine:75]  General appearance: alert and no distress Neurologic: intact Heart: regular rate and rhythm Lungs: diminished breath sounds bibasilar and L>R Abdomen: normal findings: soft, non-tender  Lab Results:  Recent Labs  03/29/14 1710 03/29/14 1714 03/30/14 0415  WBC 13.6*  --  11.9*  HGB 11.3* 11.6* 10.3*  HCT 32.4* 34.0* 29.2*  PLT 134*  --  135*   BMET:  Recent Labs  03/29/14 0353  03/29/14 1714 03/30/14 0415  NA 140  --  134* 136*  K 4.5  --  4.0 3.8  CL 106  --  102 100  CO2 21  --   --  24  GLUCOSE 132*  --  127* 106*  BUN 10  --  9 11  CREATININE 0.75  < > 0.60 0.61  CALCIUM 7.4*  --   --  7.9*  < > = values in this interval not displayed.  PT/INR:  Recent Labs  03/28/14 1330  LABPROT 17.9*  INR 1.48   ABG    Component Value Date/Time   PHART 7.394 03/29/2014 0006   HCO3 23.6 03/29/2014  0006   TCO2 19 03/29/2014 1714   ACIDBASEDEF 1.0 03/29/2014 0006   O2SAT 97.0 03/29/2014 0006   CBG (last 3)   Recent Labs  03/29/14 1955 03/30/14 0016 03/30/14 0746  GLUCAP 163* 96 108*    Assessment/Plan: S/P Procedure(s) (LRB): CORONARY ARTERY BYPASS GRAFTING (CABG) x  4 using left internal mammary artery and bilateral saphenous leg vein. (N/A) INTRAOPERATIVE TRANSESOPHAGEAL ECHOCARDIOGRAM (N/A) Plan for transfer to step-down: see transfer orders  CV- stable in SR on low dose lopressor  Resume statin  Continue ASA  RESP- stable, continue IS for atelectasis  RENAL- lytes and creatinine Ok, PO lasix  ENDO- CBG well controlled, change to AC/HS  DVT prophylaxis-  enoxaparin + SCD  Transfer to 2000   LOS: 4 days    [name] [name],[name] [name] 03/30/2014

## 2014-03-31 ENCOUNTER — Encounter (HOSPITAL_COMMUNITY): Payer: Self-pay | Admitting: Thoracic Surgery (Cardiothoracic Vascular Surgery)

## 2014-03-31 ENCOUNTER — Inpatient Hospital Stay (HOSPITAL_COMMUNITY): Payer: BC Managed Care – PPO

## 2014-03-31 DIAGNOSIS — Z951 Presence of aortocoronary bypass graft: Secondary | ICD-10-CM

## 2014-03-31 DIAGNOSIS — F17200 Nicotine dependence, unspecified, uncomplicated: Secondary | ICD-10-CM

## 2014-03-31 LAB — COMPREHENSIVE METABOLIC PANEL
ALT: 15 U/L (ref 0–53)
AST: 18 U/L (ref 0–37)
Albumin: 2.4 g/dL — ABNORMAL LOW (ref 3.5–5.2)
Alkaline Phosphatase: 43 U/L (ref 39–117)
Anion gap: 13 (ref 5–15)
BILIRUBIN TOTAL: 0.4 mg/dL (ref 0.3–1.2)
BUN: 12 mg/dL (ref 6–23)
CHLORIDE: 102 meq/L (ref 96–112)
CO2: 23 meq/L (ref 19–32)
Calcium: 8.3 mg/dL — ABNORMAL LOW (ref 8.4–10.5)
Creatinine, Ser: 0.64 mg/dL (ref 0.50–1.35)
GLUCOSE: 102 mg/dL — AB (ref 70–99)
POTASSIUM: 4 meq/L (ref 3.7–5.3)
Sodium: 138 mEq/L (ref 137–147)
Total Protein: 5.8 g/dL — ABNORMAL LOW (ref 6.0–8.3)

## 2014-03-31 LAB — CBC
HEMATOCRIT: 29.3 % — AB (ref 39.0–52.0)
HEMOGLOBIN: 10.3 g/dL — AB (ref 13.0–17.0)
MCH: 31.1 pg (ref 26.0–34.0)
MCHC: 35.2 g/dL (ref 30.0–36.0)
MCV: 88.5 fL (ref 78.0–100.0)
Platelets: 172 10*3/uL (ref 150–400)
RBC: 3.31 MIL/uL — AB (ref 4.22–5.81)
RDW: 13.4 % (ref 11.5–15.5)
WBC: 11.6 10*3/uL — ABNORMAL HIGH (ref 4.0–10.5)

## 2014-03-31 LAB — GLUCOSE, CAPILLARY: GLUCOSE-CAPILLARY: 105 mg/dL — AB (ref 70–99)

## 2014-03-31 MED ORDER — LISINOPRIL 2.5 MG PO TABS
2.5000 mg | ORAL_TABLET | Freq: Every day | ORAL | Status: DC
  Administered 2014-03-31 – 2014-04-02 (×3): 2.5 mg via ORAL
  Filled 2014-03-31 (×3): qty 1

## 2014-03-31 MED ORDER — TRAMADOL HCL 50 MG PO TABS
50.0000 mg | ORAL_TABLET | Freq: Four times a day (QID) | ORAL | Status: DC | PRN
  Administered 2014-03-31 – 2014-04-01 (×2): 50 mg via ORAL
  Filled 2014-03-31 (×2): qty 1

## 2014-03-31 MED FILL — Heparin Sodium (Porcine) Inj 1000 Unit/ML: INTRAMUSCULAR | Qty: 30 | Status: AC

## 2014-03-31 MED FILL — Potassium Chloride Inj 2 mEq/ML: INTRAVENOUS | Qty: 40 | Status: AC

## 2014-03-31 MED FILL — Magnesium Sulfate Inj 50%: INTRAMUSCULAR | Qty: 10 | Status: AC

## 2014-03-31 NOTE — Progress Notes (Signed)
While putting patient back in bed, patient stated he has seen two strange dragon like animals outside his window, singing and chanting. Patient is oriented x4. Explained to the patient that only the parking lot is outside his window. Patient says he will check again in the daylight. Will continue to monitor.

## 2014-03-31 NOTE — Progress Notes (Addendum)
      [name]       [place] [address]             [phone]      3 Days Post-Op Procedure(s) (LRB): CORONARY ARTERY BYPASS GRAFTING (CABG) x  4 using left internal mammary artery and bilateral saphenous leg vein. (N/A) INTRAOPERATIVE TRANSESOPHAGEAL ECHOCARDIOGRAM (N/A)  Subjective:  [name] [name] states he is doing okay.  He is convinced there were 8-10 people outside his window last night, portraying a story of them celebrating a dragon.  He is ambulating + BM Objective: Vital signs in last 24 hours: Temp:  [98.1 F (36.7 C)-99.6 F (37.6 C)] 98.3 F (36.8 C) (09/21 0400) Pulse Rate:  [78-81] 81 (09/21 0400) Cardiac Rhythm:  [-] Normal sinus rhythm (09/21 0740) Resp:  [18-26] 21 (09/21 0400) BP: (107-120)/(66-73) 118/72 mmHg (09/21 0400) SpO2:  [93 %-98 %] 97 % (09/21 0400) Weight:  [193 lb 11.2 oz (87.862 kg)] 193 lb 11.2 oz (87.862 kg) (09/21 0400)  Intake/Output from previous day: 09/20 0701 - 09/21 0700 In: 550 [P.O.:480; I.V.:20; IV Piggyback:50] Out: 150 [Urine:150]  General appearance: alert, cooperative and no distress Heart: regular rate and rhythm Lungs: clear to auscultation bilaterally Abdomen: soft, non-tender; bowel sounds normal; no masses,  no organomegaly Extremities: edema trace Wound: clean and dry  Lab Results:  Recent Labs  03/30/14 0415 03/31/14 0724  WBC 11.9* 11.6*  HGB 10.3* 10.3*  HCT 29.2* 29.3*  PLT 135* 172   BMET:  Recent Labs  03/30/14 0415 03/31/14 0724  NA 136* 138  K 3.8 4.0  CL 100 102  CO2 24 23  GLUCOSE 106* 102*  BUN 11 12  CREATININE 0.61 0.64  CALCIUM 7.9* 8.3*    PT/INR:  Recent Labs  03/28/14 1330  LABPROT 17.9*  INR 1.48   ABG    Component Value Date/Time   PHART 7.394 03/29/2014 0006   HCO3 23.6 03/29/2014 0006   TCO2 19 03/29/2014 1714   ACIDBASEDEF 1.0 03/29/2014 0006   O2SAT 97.0 03/29/2014 0006   CBG (last 3)   Recent Labs  03/30/14 1624 03/30/14 2114 03/31/14 0607    GLUCAP 102* 150* 105*    Assessment/Plan: S/P Procedure(s) (LRB): CORONARY ARTERY BYPASS GRAFTING (CABG) x  4 using left internal mammary artery and bilateral saphenous leg vein. (N/A) INTRAOPERATIVE TRANSESOPHAGEAL ECHOCARDIOGRAM (N/A)  1. CV- NSR good rate and pressure control- continue Lopressor, Lisinopril 2. Pulm- off oxygen, encouraged IS 3. Renal- creatinine, lytes okay, weight is stable only 3lbs over admission weight 4. CBGs controlled, will d/c fingersticks 5. Dispo- patient doing well, he does have some hallucinations will d/c OXY, add Tramadol, D/C EPW, likely home in 24-48 hours   LOS: 5 days    [name] [name] 03/31/2014  I have seen and examined the patient and agree with the assessment and plan as outlined.  Start low dose ACE-I. Continue ASA, beta blocker and statin.  [name] [name] 03/31/2014 9:14 AM

## 2014-03-31 NOTE — Discharge Summary (Signed)
Physician Discharge Summary  Patient ID: [name] [name] MRN: [id number] DOB/AGE: [birth year] 63 y.o.  Admit date: 03/26/2014 Discharge date: 04/02/2014  Admission Diagnoses:  Patient Active Problem List   Diagnosis Date Noted  . NSTEMI (non-ST elevated myocardial infarction) 03/26/2014  . Atherosclerotic heart disease of native coronary artery with unstable angina pectoris   . Ischemic cardiomyopathy   . Essential hypertension   . Hyperlipidemia with target LDL less than 70   . Tobacco abuse   . Personal history of colonic polyps 02/26/2013   Discharge Diagnoses:   Patient Active Problem List   Diagnosis Date Noted  . 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 NSTEMI (non-ST elevated myocardial infarction) 03/26/2014  . Atherosclerotic heart disease of native coronary artery with unstable angina pectoris   . Ischemic cardiomyopathy   . Essential hypertension   . Hyperlipidemia with target LDL less than 70   . Tobacco abuse   . Personal history of colonic polyps 02/26/2013   Discharged Condition: good  History of Present Illness:   [name] is a 63 yo white male with no known history of CAD.  He does have history of HTN, Hyperlipidemia, and long standing tobacco abuse.   The patient describes a one year history of progressive symptoms of exertional fatigue and subsequent chest discomfort. Symptoms of chest discomfort are described as dull, sometimes burning substernal chest pain that frequently occurs after eating a large meal and also occurs with strenuous physical activity. Symptoms have been associated with decreased energy and progressive exertional fatigue without shortness of breath. Approximately one week ago he developed more severe substernal chest discomfort associated with diaphoresis while he was playing golf. Symptoms eventually subsided after 40 minutes. Several days ago he developed similar chest discomfort that awoke him from his sleep in the morning. Symptoms persisted all day,  ultimately causing the patient to seek evaluation in his primary care physician's office. Electrocardiogram did not reveal acute ischemic changes, but serum troponin level was elevated at 12. The patient was sent to the emergency department at [facility] where he was admitted to the hospital and subsequently ruled in for acute non-ST segment elevation myocardial infarction. Symptoms of chest discomfort resolved with nitrates and intravenous heparin. He was evaluated by Dr. Rockey Situ and underwent diagnostic heart catheterization 03/25/2014. This revealed severe three-vessel coronary artery disease with severe left ventricular dysfunction. The patient was transferred to [facility] for surgical evaluation.  Hospital Course:   Upon arrival to the hospital the patient was chest pain free.  He was evaluated by Dr. Roxy Manns who felt he would benefit from Coronary Bypass Grafting procedure.  The risks and benefits of the procedure were explained to the patient and he was agreeable to proceed.  [name] was taken to the operating room and underwent CABG x 4 utilizing LIMA to LAD, SVG to PDA, SVG to OM, and SVG to Diagonal.  He also underwent Endoscopic saphenous vein harvest from his right leg and left thigh.  He tolerated the procedure without difficulty and was taken to the SICU in stable condition.  The patient was extubated the evening of surgery.  During his stay in the SICU the patient was weaned off Milrinone as tolerated.  His chest tubes and arterial lines were removed without difficulty.  He was maintaining NSR and felt to be medically stable for transfer to the telemetry unit.  The evening of transfer the patient developed hallucinations.  He was taken off narcotic pain medication.  His hallucinations resolved after discontinuation  of this medication.  He continues maintain NSR and his pacing wires were removed without difficulty.  He is tolerating a heart healthy diet.  He is ambulating without difficulty.  He developed a  very small amount of sero sanguinous drainage from his lower sternal wound on 9/22. He remained afebrile and there was no erythema. He did not require antibiotics. He has been seen and evaluated by Dr. Roxy Manns today and is felt surgically stable for discharge.  Treatments: surgery:   Coronary Artery Bypass Grafting x 4  Left Internal Mammary Artery to Distal Left Anterior Descending Coronary Artery  Saphenous Vein Graft to Posterior Descending Coronary Artery  Saphenous Vein Graft to Obtuse Marginal Branch of Left Circumflex Coronary Artery  Sapheonous Vein Graft to Diagonal Branch Coronary Artery  Endoscopic Vein Harvest from Bilateral Thighs and Right Lower Leg  Disposition: Home  Discharge medications:    Medication List    STOP taking these medications       diltiazem 420 MG 24 hr capsule  Commonly known as:  TIAZAC      TAKE these medications       aspirin 325 MG EC tablet  Take 1 tablet (325 mg total) by mouth daily.     furosemide 40 MG tablet  Commonly known as:  LASIX  Take 1 tablet (40 mg total) by mouth daily. For one week then stop.     lisinopril 2.5 MG tablet  Commonly known as:  PRINIVIL,ZESTRIL  Take 1 tablet (2.5 mg total) by mouth daily.     metoprolol tartrate 25 MG tablet  Commonly known as:  LOPRESSOR  Take 0.5 tablets (12.5 mg total) by mouth 2 (two) times daily.     multivitamin with minerals tablet  Take 1 tablet by mouth daily.     Omega 3 1000 MG Caps  Take 2,000 mg by mouth daily.     potassium chloride SA 20 MEQ tablet  Commonly known as:  K-DUR,KLOR-CON  Take 1 tablet (20 mEq total) by mouth daily. For one week.     pravastatin 40 MG tablet  Commonly known as:  PRAVACHOL  Take 40 mg by mouth at bedtime.     traMADol 50 MG tablet  Commonly known as:  ULTRAM  Take 1 tablet (50 mg total) by mouth every 6 (six) hours as needed for moderate pain.       The patient has been discharged on:   1.Beta Blocker:  Yes [ x  ]                               No   [   ]                              If No, reason:  2.Ace Inhibitor/ARB: Yes [ x  ]                                     No  [    ]                                     If No, reason:  3.Statin:   Yes [x   ]  No  [   ]                  If No, reason:  4.Ecasa:  Yes  [ x  ]                  No   [   ]                  If No, reason:   Follow-up Information   Follow up with Rexene Alberts, MD On 04/28/2014. (Appointment is at 3:30)    Specialty:  Cardiothoracic Surgery   Contact information:   [address] [place] [address] [phone]       Follow up with [facility] On 04/28/2014. (Please get CXR at 2:30, office is located on first floor of [facility])    Contact information:   [place]       Follow up with Ida Rogue, MD On 04/16/2014. (Appointment is at 8:30)    Specialty:  Cardiology   Contact information:   [place] [address] [phone]       Follow up with Albina Billet, MD. (Call for an appointment regarding further surveillance of HGA1C 5.9)    Specialty:  Internal Medicine   Contact information:   [address]   [place] [address] [phone]       Signed: Nani Skillern PA-C 04/02/2014, 8:51 AM

## 2014-03-31 NOTE — Progress Notes (Signed)
CARDIAC REHAB PHASE I   PRE:  Rate/Rhythm: 72 SR  BP:  Supine:   Sitting: 86/60  Standing:    SaO2: 97 RA  MODE:  Ambulation: 550 ft   POST:  Rate/Rhythm: 80  BP:  Supine:   Sitting: 120/72  Standing:    SaO2: 99 RA 1335-1400 Assisted X 1 and used walker to ambulate. Gait steady with walker. Pt able to walk 550 feet without c/o of cp or SOB. BP low before walking, he denies any dizziness with walking. He is still seeing things that are not real, but today is realizing that it is not really there.Pt to recliner after walk with call light in reach  Discussed Outpt. CRP with pt and wife. He agrees to referral to [place].  Rodney Langton RN 03/31/2014 2:00 PM  '

## 2014-03-31 NOTE — Progress Notes (Signed)
03/31/2014 10:59 AM Nursing note EPW d/c per orders and per protocol. Ends intact. Pt. Tolerated well. VSS and collected per protocol. Pt. Advised BR x 1 hr. Call bell within reach. Will continue to monitor patient.  Clotilde Loth, Arville Lime

## 2014-03-31 NOTE — Progress Notes (Signed)
03/31/2014 5:05 PM Nursing note Pt. And wife viewed recovering from heart surgery educational video #127. Questions and concerns addressed.  Erinne Gillentine, Arville Lime

## 2014-03-31 NOTE — Progress Notes (Signed)
03/31/2014 6:00 PM Nursing note Pt. Has ambulated twice today with wife independently with RW and on RA. Pt. Tolerated well. Encouraged IS and further ambulation as tolerated.  Emonnie Cannady, Arville Lime

## 2014-04-01 NOTE — Progress Notes (Signed)
CARDIAC REHAB PHASE I   PRE:  Rate/Rhythm: 67 SR  BP:  Supine:   Sitting: 98/60  Standing:    SaO2: 97%RA  MODE:  Ambulation: 700 ft   POST:  Rate/Rhythm: 78  BP:  Supine:   Sitting: 122/60  Standing:    SaO2: 99%RA 1018-1108 Pt walked 700 ft with hand held asst with steady gait. Tolerated well. Has walker at home if needed but do not think he will need to use. Education completed with pt and wife who voiced understanding. Discussed smoking cessation and pt stated he plans to quit cold [place]. He does not smoke on weekends and vacation. Gave smoking cessation handout. Pt has low EF so discussed low sodium and heart healthy diets. Referring to [facility] Phase 2. Has seen discharge video.   Graylon Good, RN BSN  04/01/2014 11:43 AM

## 2014-04-01 NOTE — Progress Notes (Addendum)
       [address]       [place] [address]             [phone]          4 Days Post-Op Procedure(s) (LRB): CORONARY ARTERY BYPASS GRAFTING (CABG) x  4 using left internal mammary artery and bilateral saphenous leg vein. (N/A) INTRAOPERATIVE TRANSESOPHAGEAL ECHOCARDIOGRAM (N/A)  Subjective: Feels well, no more hallucinations.  C/o "leakage from where they took the wires out".   Objective: Vital signs in last 24 hours: Patient Vitals for the past 24 hrs:  BP Temp Temp src Pulse Resp SpO2 Weight  04/01/14 0639 - - - - - - 189 lb 14.4 oz (86.138 kg)  04/01/14 0614 102/59 mmHg 98.3 F (36.8 C) Oral 64 17 98 % -  03/31/14 2028 101/62 mmHg 99 F (37.2 C) Oral 88 18 99 % -  03/31/14 1435 110/58 mmHg 97.8 F (36.6 C) Oral 75 18 98 % -  03/31/14 1200 99/61 mmHg - - 68 - 100 % -  03/31/14 1145 101/62 mmHg - - 72 - - -  03/31/14 1130 109/60 mmHg - - 70 - - -  03/31/14 1115 108/63 mmHg - - 66 - 99 % -  03/31/14 1100 120/70 mmHg - - 78 - 100 % -  03/31/14 1050 117/70 mmHg - - 75 - - -  03/31/14 0853 104/52 mmHg - - 77 - - -   Current Weight  04/01/14 189 lb 14.4 oz (86.138 kg)   BASELINE WEIGHT: 84 kg   Intake/Output from previous day: 09/21 0701 - 09/22 0700 In: -  Out: 600 [Urine:600]    PHYSICAL EXAM:  Heart: RRR Lungs: Clear Wound: Scant amount of serosanguinous drainage from lower portion of sternal incision, no purulence or erythema Extremities: Minimal LE edema    Lab Results: CBC: Recent Labs  03/30/14 0415 03/31/14 0724  WBC 11.9* 11.6*  HGB 10.3* 10.3*  HCT 29.2* 29.3*  PLT 135* 172   BMET:  Recent Labs  03/30/14 0415 03/31/14 0724  NA 136* 138  K 3.8 4.0  CL 100 102  CO2 24 23  GLUCOSE 106* 102*  BUN 11 12  CREATININE 0.61 0.64  CALCIUM 7.9* 8.3*    PT/INR: No results found for this basename: LABPROT, INR,  in the last 72 hours    Assessment/Plan: S/P Procedure(s) (LRB): CORONARY ARTERY BYPASS GRAFTING  (CABG) x  4 using left internal mammary artery and bilateral saphenous leg vein. (N/A) INTRAOPERATIVE TRANSESOPHAGEAL ECHOCARDIOGRAM (N/A)  CV- SR, BPs stable.  Continue bb, ACE-I.  Vol overload- diurese.  Sternal drainage- small amount, serosanguinous.  No signs of infection.  Possibly home later today if he remains stable.   LOS: 6 days    COLLINS,GINA H 04/01/2014  I have seen and examined the patient and agree with the assessment and plan as outlined.  Some serousanguinous drainage from inferior portion of sternal incision overnight.  Will observe for now with plans to d/c home this afternoon if no further drainage, but will hold d/c if significant drainage persists.  OWEN,CLARENCE H 04/01/2014 8:17 AM

## 2014-04-01 NOTE — Care Management Note (Unsigned)
    Page 1 of 1   04/01/2014     3:11:03 PM CARE MANAGEMENT NOTE 04/01/2014  Patient:  [name] [name], [name] [name]   Account Number:  [id number]  Date Initiated:  04/01/2014  Documentation initiated by:  Sonora Catlin  Subjective/Objective Assessment:   Pt adm s/p CABG x 4 on 03/28/14.  PTA, pt independent, lives with spouse.     Action/Plan:   Pt denies any needs for home.   Anticipated DC Date:  04/01/2014   Anticipated DC Plan:  [place]  CM consult      Choice offered to / List presented to:             Status of service:  In process, will continue to follow Medicare Important Message given?   (If response is "NO", the following Medicare IM given date fields will be blank) Date Medicare IM given:   Medicare IM given by:   Date Additional Medicare IM given:   Additional Medicare IM given by:    Discharge Disposition:    Per UR Regulation:  Reviewed for med. necessity/level of care/duration of stay  If discussed at [place] of Stay Meetings, dates discussed:   04/01/2014    Comments:

## 2014-04-02 MED ORDER — LISINOPRIL 2.5 MG PO TABS: 2.5000 mg | ORAL_TABLET | Freq: Every day | ORAL | Status: DC

## 2014-04-02 MED ORDER — METOPROLOL TARTRATE 25 MG PO TABS: 12.5000 mg | ORAL_TABLET | Freq: Two times a day (BID) | ORAL | Status: DC

## 2014-04-02 MED ORDER — FUROSEMIDE 40 MG PO TABS: 40.0000 mg | ORAL_TABLET | Freq: Every day | ORAL | Status: DC

## 2014-04-02 MED ORDER — POTASSIUM CHLORIDE CRYS ER 20 MEQ PO TBCR: 20.0000 meq | EXTENDED_RELEASE_TABLET | Freq: Every day | ORAL | Status: DC

## 2014-04-02 MED ORDER — TRAMADOL HCL 50 MG PO TABS: 50.0000 mg | ORAL_TABLET | Freq: Four times a day (QID) | ORAL | Status: DC | PRN

## 2014-04-02 MED ORDER — ASPIRIN 325 MG PO TBEC: 325.0000 mg | DELAYED_RELEASE_TABLET | Freq: Every day | ORAL | Status: DC

## 2014-04-02 NOTE — Progress Notes (Signed)
Pt discharging home with wife.  All instructions and prescriptions given and reviewed.  F/U appts in place.  Vaccines given, all questions answered.

## 2014-04-02 NOTE — Discharge Instructions (Signed)
Apply dry 4x4 with tape to lower sternal wound and change dressing daily. Once there is no drainage, may leave sternal wound open to the air. If he develops increase drainage, erythema, fever, or chills, please call office as soon as possible.  Coronary Artery Bypass Grafting, Care After Refer to this sheet in the next few weeks. These instructions provide you with information on caring for yourself after your procedure. Your health care provider may also give you more specific instructions. Your treatment has been planned according to current medical practices, but problems sometimes occur. Call your health care provider if you have any problems or questions after your procedure. WHAT TO EXPECT AFTER THE PROCEDURE Recovery from surgery will be different for everyone. Some people feel well after 3 or 4 weeks, while for others it takes longer. After your procedure, it is typical to have the following:  Nausea and a lack of appetite.   Constipation.  Weakness and fatigue.   Depression or irritability.   Pain or discomfort at your incision site. HOME CARE INSTRUCTIONS  Take medicines only as directed by your health care provider. Do not stop taking medicines or start any new medicines without first checking with your health care provider.  Take your pulse as directed by your health care provider.  Perform deep breathing as directed by your health care provider. If you were given a device called an incentive spirometer, use it to practice deep breathing several times a day. Support your chest with a pillow or your arms when you take deep breaths or cough.  Keep incision areas clean, dry, and protected. Remove or change any bandages (dressings) only as directed by your health care provider. You may have skin adhesive strips over the incision areas. Do not take the strips off. They will fall off on their own.  Check incision areas daily for any swelling, redness, or drainage.  If incisions  were made in your legs, do the following:  Avoid crossing your legs.   Avoid sitting for long periods of time. Change positions every 30 minutes.   Elevate your legs when you are sitting.  Wear compression stockings as directed by your health care provider. These stockings help keep blood clots from forming in your legs.  Take showers once your health care provider approves. Until then, only take sponge baths. Pat incisions dry. Do not rub incisions with a washcloth or towel. Do not take baths, swim, or use a hot tub until your health care provider approves.  Eat foods that are high in fiber, such as raw fruits and vegetables, whole grains, beans, and nuts. Meats should be lean cut. Avoid canned, processed, and fried foods.  Drink enough fluid to keep your urine clear or pale yellow.  Weigh yourself every day. This helps identify if you are retaining fluid that may make your heart and lungs work harder.  Rest and limit activity as directed by your health care provider. You may be instructed to:  Stop any activity at once if you have chest pain, shortness of breath, irregular heartbeats, or dizziness. Get help right away if you have any of these symptoms.  Move around frequently for short periods or take short walks as directed by your health care provider. Increase your activities gradually. You may need physical therapy or cardiac rehabilitation to help strengthen your muscles and build your endurance.  Avoid lifting, pushing, or pulling anything heavier than 10 lb (4.5 kg) for at least 6 weeks after surgery.  Do  not drive until your health care provider approves.  Ask your health care provider when you may return to work.  Ask your health care provider when you may resume sexual activity.  Keep all follow-up visits as directed by your health care provider. This is important. SEEK MEDICAL CARE IF:  You have swelling, redness, increasing pain, or drainage at the site of an  incision.  You have a fever.  You have swelling in your ankles or legs.  You have pain in your legs.   You gain 2 or more pounds (0.9 kg) a day.  You are nauseous or vomit.  You have diarrhea. SEEK IMMEDIATE MEDICAL CARE IF:  You have chest pain that goes to your jaw or arms.  You have shortness of breath.   You have a fast or irregular heartbeat.   You notice a "clicking" in your breastbone (sternum) when you move.   You have numbness or weakness in your arms or legs.  You feel dizzy or light-headed.  MAKE SURE YOU:  Understand these instructions.  Will watch your condition.  Will get help right away if you are not doing well or get worse. Document Released: 01/14/2005 Document Revised: 11/11/2013 Document Reviewed: 12/04/2012 [facility] Patient Information 2015 [place], [place]. This information is not intended to replace advice given to you by your health care provider. Make sure you discuss any questions you have with your health care provider.  Endoscopic Saphenous Vein Harvesting Care After Refer to this sheet in the next few weeks. These instructions provide you with information on caring for yourself after your procedure. Your health care provider may also give you more specific instructions. Your treatment has been planned according to current medical practices, but problems sometimes occur. Call your health care provider if you have any problems or questions after your procedure. HOME CARE INSTRUCTIONS Medicine  Take whatever pain medicine your surgeon prescribes. Follow the directions carefully. Do not take over-the-counter pain medicine unless your surgeon says it is okay. Some pain medicine can cause bleeding problems for several weeks after surgery.  Follow your surgeon's instructions about driving. You will probably not be permitted to drive after heart surgery.  Take any medicines your surgeon prescribes. Any medicines you took before your heart  surgery should be checked with your health care provider before you start taking them again. Wound care  If your surgeon has prescribed an elastic bandage or stocking, ask how long you should wear it.  Check the area around your surgical cuts (incisions) whenever your bandages (dressings) are changed. Look for any redness or swelling.  You will need to return to have the stitches (sutures) or staples taken out. Ask your surgeon when to do that.  Ask your surgeon when you can shower or bathe. Activity  Try to keep your legs raised when you are sitting.  Do any exercises your health care providers have given you. These may include deep breathing exercises, coughing, walking, or other exercises. SEEK MEDICAL CARE IF:  You have any questions about your medicines.  You have more leg pain, especially if your pain medicine stops working.  New or growing bruises develop on your leg.  Your leg swells, feels tight, or becomes red.  You have numbness in your leg. SEEK IMMEDIATE MEDICAL CARE IF:  Your pain gets much worse.  Blood or fluid leaks from any of the incisions.  Your incisions become warm, swollen, or red.  You have chest pain.  You have trouble breathing.  You have a fever.  You have more pain near your leg incision. MAKE SURE YOU:  Understand these instructions.  Will watch your condition.  Will get help right away if you are not doing well or get worse. Document Released: 03/09/2011 Document Revised: 07/02/2013 Document Reviewed: 03/09/2011 [facility] Patient Information 2015 [place], [place]. This information is not intended to replace advice given to you by your health care provider. Make sure you discuss any questions you have with your health care provider.

## 2014-04-02 NOTE — Progress Notes (Addendum)
      [address]       [name] [address]             [phone]        5 Days Post-Op Procedure(s) (LRB): CORONARY ARTERY BYPASS GRAFTING (CABG) x  4 using left internal mammary artery and bilateral saphenous leg vein. (N/A) INTRAOPERATIVE TRANSESOPHAGEAL ECHOCARDIOGRAM (N/A)  Subjective: Only complaint this am is sternal drainage  Objective: Vital signs in last 24 hours: Temp:  [97.9 F (36.6 C)-98.4 F (36.9 C)] 97.9 F (36.6 C) (09/23 0437) Pulse Rate:  [63-70] 70 (09/23 0437) Cardiac Rhythm:  [-] Normal sinus rhythm (09/22 2038) Resp:  [18] 18 (09/23 0437) BP: (98-109)/(51-61) 101/61 mmHg (09/23 0437) SpO2:  [97 %] 97 % (09/23 0437) Weight:  [195 lb 5.2 oz (88.6 kg)] 195 lb 5.2 oz (88.6 kg) (09/23 0437)  Pre op weight 84 kg Current Weight  04/02/14 195 lb 5.2 oz (88.6 kg)     Intake/Output from previous day: 09/22 0701 - 09/23 0700 In: 720 [P.O.:720] Out: 250 [Urine:250]   Physical Exam:  Cardiovascular: RRR Pulmonary: Clear to auscultation bilaterally; no rales, wheezes, or rhonchi. Abdomen: Soft, non tender, bowel sounds present. Extremities: Mild bilateral lower extremity edema. Wounds: RLE wounds are clean and dry.  No erythema or signs of infection. There is minor sero sanguinous drainage from lower sternal incision. No erythema.  Lab Results: CBC: Recent Labs  03/31/14 0724  WBC 11.6*  HGB 10.3*  HCT 29.3*  PLT 172   BMET:  Recent Labs  03/31/14 0724  NA 138  K 4.0  CL 102  CO2 23  GLUCOSE 102*  BUN 12  CREATININE 0.64  CALCIUM 8.3*    PT/INR:  Lab Results  Component Value Date   INR 1.48 03/28/2014   INR 1.26 03/26/2014   ABG:  INR: Will add last result for INR, ABG once components are confirmed Will add last 4 CBG results once components are confirmed  Assessment/Plan:  1. CV - SR in the 60's. On Lopressor 12.5 bid and Lisinopril 2.5 daily. SBP labile (low 100's) may need to stop ACE. 2.  Pulmonary -  Encourage incentive spirometer 3. Volume Overload - On Lasix 40 daily 4.  Acute blood loss anemia - Last H and H 10.3 and 29.3 5. Sternal drainage-small amount. No erythema. No need for antibiotics at this time. 6. As discussed with Dr. Roxy Manns, discharge  [name],[name] [name] MPA-C 04/02/2014,8:05 AM  I have seen and examined the patient and agree with the assessment and plan as outlined.  Lorilee Cafarella H 04/02/2014 9:22 AM

## 2014-04-16 ENCOUNTER — Ambulatory Visit (INDEPENDENT_AMBULATORY_CARE_PROVIDER_SITE_OTHER): Payer: BC Managed Care – PPO | Admitting: Cardiovascular Disease

## 2014-04-16 ENCOUNTER — Telehealth: Payer: Self-pay

## 2014-04-16 ENCOUNTER — Encounter: Payer: Self-pay | Admitting: Cardiovascular Disease

## 2014-04-16 VITALS — BP 100/60 | HR 64 | Ht 71.0 in | Wt 186.2 lb

## 2014-04-16 DIAGNOSIS — Z720 Tobacco use: Secondary | ICD-10-CM

## 2014-04-16 DIAGNOSIS — I25110 Atherosclerotic heart disease of native coronary artery with unstable angina pectoris: Secondary | ICD-10-CM

## 2014-04-16 DIAGNOSIS — E785 Hyperlipidemia, unspecified: Secondary | ICD-10-CM

## 2014-04-16 DIAGNOSIS — I10 Essential (primary) hypertension: Secondary | ICD-10-CM

## 2014-04-16 DIAGNOSIS — R0789 Other chest pain: Secondary | ICD-10-CM

## 2014-04-16 DIAGNOSIS — I255 Ischemic cardiomyopathy: Secondary | ICD-10-CM

## 2014-04-16 DIAGNOSIS — Z951 Presence of aortocoronary bypass graft: Secondary | ICD-10-CM

## 2014-04-16 MED ORDER — TICAGRELOR 90 MG PO TABS: 90.0000 mg | ORAL_TABLET | Freq: Two times a day (BID) | ORAL | Status: DC

## 2014-04-16 NOTE — Assessment & Plan Note (Signed)
He is currently not smoking. He does not want any assistance with chantix.

## 2014-04-16 NOTE — Telephone Encounter (Signed)
Spoke w/ pt's wife.  Advised her to have pt start Brilinta 90mg  BID and decrease aspirin to 81 mg daily. Advised her to have pt take 180mg  as soon as he gets the med, then resume BID dosing. Advised pt that I am leaving samples at the front desk for her to pick up at her convenience, as well as sending to pharmacy. She is appreciative and states that her sister will pick these up tomorrow am.   She reports that pt was scheduled to have a tooth extracted, but this was cancelled due to pt's hospitalization.  She reports that tooth is bothersome to pt, as it is broken, and she would like to know if they can reschedule this.  Please advise.  Thank you.

## 2014-04-16 NOTE — Assessment & Plan Note (Signed)
Doing well following his recent bypass surgery. No medication  changes made. no pleural effusion noted.

## 2014-04-16 NOTE — Assessment & Plan Note (Signed)
Prior occlusion of his RCA, left circumflex, diagonal We'll decrease him down to aspirin 81 mg daily, start brilinta 90 milligrams twice a day after load of 180 mg x1

## 2014-04-16 NOTE — Assessment & Plan Note (Signed)
Goal LDL less than 70. Repeat blood work in several months time

## 2014-04-16 NOTE — Patient Instructions (Addendum)
You are doing well. No medication changes were made.  We are sending a referral the [facility] for cardiac rehab They will contact you with an appointment  Please call us if you have new issues that need to be addressed before your next appt.  Your physician wants you to follow-up in: 6 months.  You will receive a reminder letter in the mail two months in advance. If you don't receive a letter, please call our office to schedule the follow-up appointment.

## 2014-04-16 NOTE — Progress Notes (Signed)
Patient ID: [name] [name], male    DOB: 09/04/1950, 63 y.o.   MRN: [id number]  HPI Comments: [name] [name] is a 63 year old gentleman with long history of smoking, borderline diabetes, seen in the hospital 03/24/2014 for chest pain, troponin of 12, non-STEMI, taken to the cardiac catheterization lab that showed occluded mid RCA, occluded mid circumflex, severe proximal LAD disease, also occluded diagonal vessel that was felt to be the culprit, ejection fraction 35%. He was transferred to  for  CABG.  He underwent CABG x4 by Dr. Roxy Manns on 03/28/2014 No significant complications reported In followup, he feels well. He has stopped smoking. He has started doing some light exercises, light walking. Denies any cough, shortness of breath. He is nervous about laying on his left side, still sleeps in a recliner. He is tolerating his medications well, no lightheadedness  EKG shows normal sinus rhythm with rate 64 beats per minute, diffuse T-wave abnormality anterior, anterolateral leads  Cardiac catheterization report details 100% occluded proximal diagonal vessel, occluded mid RCA, occluded mid circumflex, severe proximal LAD disease with collaterals from left to right suggesting CTO, left to left collaterals to OM 3 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  FFR was performed on the LAD and this was significant.   Outpatient Encounter Prescriptions as of 04/16/2014  Medication Sig  . aspirin EC 325 MG EC tablet Take 1 tablet (325 mg total) by mouth daily.  Marland Kitchen lisinopril (PRINIVIL,ZESTRIL) 2.5 MG tablet Take 1 tablet (2.5 mg total) by mouth daily.  . metoprolol tartrate (LOPRESSOR) 25 MG tablet Take 0.5 tablets (12.5 mg total) by mouth 2 (two) times daily.  . Multiple Vitamins-Minerals (MULTIVITAMIN WITH MINERALS) tablet Take 1 tablet by mouth daily.  . OMEGA 3 1000 MG CAPS Take 2,000 mg by mouth daily.  . pravastatin (PRAVACHOL) 40 MG tablet Take 40 mg by mouth at bedtime.  . traMADol (ULTRAM) 50 MG  tablet Take 1 tablet (50 mg total) by mouth every 6 (six) hours as needed for moderate pain.   Review of Systems  HENT: Negative.   Eyes: Negative.   Respiratory: Negative.   Cardiovascular: Negative.   Gastrointestinal: Negative.   Endocrine: Negative.   Musculoskeletal: Negative.   Skin: Negative.   Allergic/Immunologic: Negative.   Neurological: Positive for weakness.  Hematological: Negative.   Psychiatric/Behavioral: Negative.   All other systems reviewed and are negative.   BP 100/60  Pulse 64  Ht 5\' 11"  (1.803 m)  Wt 186 lb 4 oz (84.482 kg)  BMI 25.99 kg/m2  Physical Exam  Nursing note and vitals reviewed. Constitutional: He is oriented to person, place, and time. He appears well-developed and well-nourished.  HENT:  Head: Normocephalic.  Nose: Nose normal.  Mouth/Throat: Oropharynx is clear and moist.  Eyes: Conjunctivae are normal. Pupils are equal, round, and reactive to light.  Neck: Normal range of motion. Neck supple. No JVD present.  Cardiovascular: Normal rate, regular rhythm, S1 normal, S2 normal, normal heart sounds and intact distal pulses.  Exam reveals no gallop and no friction rub.   No murmur heard. Well healed mediastinal scar  Pulmonary/Chest: Effort normal and breath sounds normal. No respiratory distress. He has no wheezes. He has no rales. He exhibits no tenderness.  Abdominal: Soft. Bowel sounds are normal. He exhibits no distension. There is no tenderness.  Musculoskeletal: Normal range of motion. He exhibits no edema and no tenderness.  Lymphadenopathy:    He has no cervical adenopathy.  Neurological: He is alert and oriented to  person, place, and time. Coordination normal.  Skin: Skin is warm and dry. No rash noted. No erythema.  Psychiatric: He has a normal mood and affect. His behavior is normal. Judgment and thought content normal.      Assessment and Plan

## 2014-04-16 NOTE — Assessment & Plan Note (Signed)
Blood pressure borderline low. We'll continue him on his current medications for now as he is asymptomatic

## 2014-04-16 NOTE — Assessment & Plan Note (Signed)
We'll need followup echocardiogram in several months time for new baseline EF

## 2014-04-17 NOTE — Telephone Encounter (Signed)
Pt's wife called back inquiring if pt can proceed w/ tooth extraction.

## 2014-04-18 NOTE — Telephone Encounter (Signed)
Mentioned before, would hold the brilinta if he is planning a tooth extraction Start the new medication 48 hours after the procedure

## 2014-04-21 NOTE — Addendum Note (Signed)
Addendum created 04/21/14 0908 by Kate Sable, MD   Modules edited: Anesthesia Events, Anesthesia Flowsheet

## 2014-04-23 ENCOUNTER — Other Ambulatory Visit: Payer: Self-pay | Admitting: Thoracic Surgery (Cardiothoracic Vascular Surgery)

## 2014-04-23 DIAGNOSIS — Z951 Presence of aortocoronary bypass graft: Secondary | ICD-10-CM

## 2014-04-28 ENCOUNTER — Ambulatory Visit (INDEPENDENT_AMBULATORY_CARE_PROVIDER_SITE_OTHER): Payer: Self-pay | Admitting: Thoracic Surgery (Cardiothoracic Vascular Surgery)

## 2014-04-28 ENCOUNTER — Ambulatory Visit
Admission: RE | Admit: 2014-04-28 | Discharge: 2014-04-28 | Disposition: A | Discharge status: Home / Self Care | Payer: BC Managed Care – PPO | Source: Ambulatory Visit | Attending: Thoracic Surgery (Cardiothoracic Vascular Surgery) | Admitting: Thoracic Surgery (Cardiothoracic Vascular Surgery)

## 2014-04-28 ENCOUNTER — Encounter: Payer: Self-pay | Admitting: Thoracic Surgery (Cardiothoracic Vascular Surgery)

## 2014-04-28 VITALS — BP 124/72 | HR 60 | Ht 71.0 in | Wt 186.0 lb

## 2014-04-28 DIAGNOSIS — Z951 Presence of aortocoronary bypass graft: Secondary | ICD-10-CM

## 2014-04-28 NOTE — Progress Notes (Signed)
[address]       [place] [address]             [phone]     CARDIOTHORACIC SURGERY OFFICE NOTE  Referring Provider is Minna Merritts, MD PCP is Albina Billet, MD   HPI:  Patient returns to the office for routine followup status post coronary artery bypass grafting x4 on 03/28/2014 for severe three-vessel coronary artery disease status post acute non-ST segment elevation myocardial infarction.  His postoperative recovery in the hospital was uncomplicated. Since hospital discharge she has continued to do well.  He was seen in followup by Dr. Rockey Situ on 04/16/2014. He was started on Brilenta at that time.  The patient states that he was contacted by the cardiac rehabilitation team but he has not yet started the outpatient cardiac rehabilitation program. He reports feeling quite well. He has very mild residual soreness in his chest. He is no longer taking any sort of pain relievers. He denies any exertional chest pain or shortness of breath. His appetite is good. Overall he has no complaints.  He continues to abstain from any cigarette smoking.   Current Outpatient Prescriptions  Medication Sig Dispense Refill  . aspirin 81 MG EC tablet Take 1 tablet (81 mg total) by mouth daily.  30 tablet  0  . lisinopril (PRINIVIL,ZESTRIL) 2.5 MG tablet Take 1 tablet (2.5 mg total) by mouth daily.  30 tablet  1  . metoprolol tartrate (LOPRESSOR) 25 MG tablet Take 0.5 tablets (12.5 mg total) by mouth 2 (two) times daily.  30 tablet  1  . Multiple Vitamins-Minerals (MULTIVITAMIN WITH MINERALS) tablet Take 1 tablet by mouth daily.      . OMEGA 3 1000 MG CAPS Take 2,000 mg by mouth daily.      . pravastatin (PRAVACHOL) 40 MG tablet Take 40 mg by mouth at bedtime.      . ticagrelor (BRILINTA) 90 MG TABS tablet Take 1 tablet (90 mg total) by mouth 2 (two) times daily.  60 tablet  11  . traMADol (ULTRAM) 50 MG tablet Take 1 tablet (50 mg total) by mouth every 6 (six) hours as needed  for moderate pain.  30 tablet  0   No current facility-administered medications for this visit.      Physical Exam:   BP 124/72  Pulse 60  Ht 5\' 11"  (1.803 m)  Wt 186 lb (84.369 kg)  BMI 25.95 kg/m2  SpO2 98%  General:  Well-appearing  Chest:   Clear  CV:   Regular rate and rhythm without murmur  Incisions:  Clean and dry and healing nicely, sternum is stable  Abdomen:  Soft nontender  Extremities:  Warm and well-perfused  Diagnostic Tests:  CHEST 2 VIEW  COMPARISON: 03/31/2014  FINDINGS:  Prior CABG. Heart size upper normal. Negative for heart failure.  Negative for infiltrate or effusion. No mass lesion.  IMPRESSION:  No active cardiopulmonary disease.  Electronically Signed  By: Franchot Gallo M.D.  On: 04/28/2014 14:38    Impression:  Patient is doing very well proximally one month status post coronary artery bypass grafting.  Plan:  I have encouraged patient to continue to gradually increase his physical activity as tolerated. I've reminded him to refrain from any heavy lifting or strenuous use of his arms or shoulders for least another 2 months. We have talked about a timeline for him to return to work. I think he may resume driving an automobile. I've encouraged him  to participate in the outpatient cardiac rehabilitation program. We have not made any changes to his current medications.  The patient will return next September for routine followup approximately one year status post coronary artery bypass grafting.   Valentina Gu. Roxy Manns, MD 04/28/2014 3:09 PM

## 2014-04-28 NOTE — Patient Instructions (Signed)
The patient should continue to avoid any heavy lifting or strenuous use of arms or shoulders for at least a total of three months from the time of surgery.  The patient may return to driving an automobile as long as they are no longer requiring oral narcotic pain relievers during the daytime.  It would be wise to start driving only short distances during the daylight and gradually increase from there as they feel comfortable.  The patient is encouraged to enroll and participate in the outpatient cardiac rehab program beginning as soon as practical.  The patient should make every effort to continue to avoid smoking permanently.    

## 2014-05-01 DIAGNOSIS — R0789 Other chest pain: Secondary | ICD-10-CM

## 2014-05-01 DIAGNOSIS — Z951 Presence of aortocoronary bypass graft: Secondary | ICD-10-CM

## 2014-05-01 DIAGNOSIS — I25110 Atherosclerotic heart disease of native coronary artery with unstable angina pectoris: Secondary | ICD-10-CM | POA: Diagnosis not present

## 2014-05-01 DIAGNOSIS — I255 Ischemic cardiomyopathy: Secondary | ICD-10-CM

## 2014-05-01 DIAGNOSIS — I10 Essential (primary) hypertension: Secondary | ICD-10-CM | POA: Diagnosis not present

## 2014-05-01 DIAGNOSIS — Z720 Tobacco use: Secondary | ICD-10-CM

## 2014-05-01 DIAGNOSIS — E785 Hyperlipidemia, unspecified: Secondary | ICD-10-CM

## 2014-05-06 ENCOUNTER — Encounter: Payer: Self-pay | Admitting: Cardiovascular Disease

## 2014-05-11 ENCOUNTER — Encounter: Payer: Self-pay | Admitting: Cardiovascular Disease

## 2014-05-26 ENCOUNTER — Other Ambulatory Visit: Payer: Self-pay | Admitting: Cardiovascular Disease

## 2014-05-26 ENCOUNTER — Other Ambulatory Visit: Payer: Self-pay | Admitting: Physician Assistant

## 2014-06-10 ENCOUNTER — Encounter: Payer: Self-pay | Admitting: Cardiovascular Disease

## 2014-07-11 ENCOUNTER — Encounter: Payer: Self-pay | Admitting: Cardiovascular Disease

## 2014-07-24 ENCOUNTER — Other Ambulatory Visit: Payer: Self-pay | Admitting: Cardiovascular Disease

## 2014-07-28 ENCOUNTER — Other Ambulatory Visit: Payer: Self-pay | Admitting: Cardiovascular Disease

## 2014-08-14 ENCOUNTER — Encounter: Payer: Self-pay | Admitting: Cardiovascular Disease

## 2014-08-30 ENCOUNTER — Other Ambulatory Visit: Payer: Self-pay | Admitting: Physician Assistant

## 2014-08-30 ENCOUNTER — Other Ambulatory Visit: Payer: Self-pay | Admitting: Cardiovascular Disease

## 2014-09-01 ENCOUNTER — Telehealth: Payer: Self-pay

## 2014-09-01 NOTE — Telephone Encounter (Signed)
Pt dental office called, states pt needs xray and possible procedure. Please advise of clearance. Pt is in office

## 2014-09-01 NOTE — Telephone Encounter (Signed)
Spoke w/ Olivia Mackie.  She states that pt is at Dr. Doreene Adas office w/ a sore tooth and they would like to proceed w/ an x-ray.  She states that their office is requesting pre-med for an x-ray and cardiac clearance to proceed w/ intervention if needed.  Advised that that there is no premed for an x-ray and for them to proceed w/ this.  Asked her to fax over cardiac clearance request w/ type of procedure, sedation and their recommendation for holding Brilinta. She states that they do not have pt's med list and "we don't do that". She states that they will most likely refer pt out to oral surgeon and she will have them fax clearance request.

## 2014-09-05 NOTE — Telephone Encounter (Signed)
Received clearance request from [facility] for extraction of 2 teeth w/ local anesthesia.  Per Christell Faith, PA, "Hold Brilinta 48 hrs prior to surgery.  Continue aspirin.  Restart Brilinta as soon as possible."  Faxed to [phone].

## 2014-10-13 ENCOUNTER — Ambulatory Visit (INDEPENDENT_AMBULATORY_CARE_PROVIDER_SITE_OTHER): Payer: BLUE CROSS/BLUE SHIELD | Admitting: Cardiovascular Disease

## 2014-10-13 ENCOUNTER — Encounter: Payer: Self-pay | Admitting: Cardiovascular Disease

## 2014-10-13 VITALS — BP 112/64 | HR 53 | Ht 72.0 in | Wt 190.5 lb

## 2014-10-13 DIAGNOSIS — E785 Hyperlipidemia, unspecified: Secondary | ICD-10-CM

## 2014-10-13 DIAGNOSIS — I214 Non-ST elevation (NSTEMI) myocardial infarction: Secondary | ICD-10-CM | POA: Diagnosis not present

## 2014-10-13 DIAGNOSIS — I25110 Atherosclerotic heart disease of native coronary artery with unstable angina pectoris: Secondary | ICD-10-CM

## 2014-10-13 DIAGNOSIS — I10 Essential (primary) hypertension: Secondary | ICD-10-CM | POA: Diagnosis not present

## 2014-10-13 DIAGNOSIS — M79602 Pain in left arm: Secondary | ICD-10-CM

## 2014-10-13 DIAGNOSIS — Z720 Tobacco use: Secondary | ICD-10-CM

## 2014-10-13 DIAGNOSIS — Z951 Presence of aortocoronary bypass graft: Secondary | ICD-10-CM

## 2014-10-13 NOTE — Assessment & Plan Note (Signed)
Blood pressure is well controlled on today's visit. No changes made to the medications. 

## 2014-10-13 NOTE — Patient Instructions (Addendum)
You are doing well.  If the brilinta is expensive, Change to plavix one a day  Please call us if you have new issues that need to be addressed before your next appt.  Your physician wants you to follow-up in: 6 months.  You will receive a reminder letter in the mail two months in advance. If you don't receive a letter, please call our office to schedule the follow-up appointment.

## 2014-10-13 NOTE — Assessment & Plan Note (Signed)
Cholesterol is at goal on the current lipid regimen. No changes to the medications were made.  

## 2014-10-13 NOTE — Assessment & Plan Note (Signed)
Doing well following his bypass surgery September 2015. Mild residual left shoulder discomfort and mildly decreased range of motion. He is previously seen orthopedics for knees and hips. Recommended he follow-up with them if symptoms do not improve. Likely torn rotator cuff

## 2014-10-13 NOTE — Assessment & Plan Note (Signed)
He stopped smoking September 2015

## 2014-10-13 NOTE — Assessment & Plan Note (Signed)
Currently with no symptoms of angina. No further workup at this time. Continue current medication regimen. At his request we'll change to aspirin 81 mg and Plavix

## 2014-10-13 NOTE — Progress Notes (Signed)
Patient ID: [name] [name], male    DOB: 1950/08/04, 64 y.o.   MRN: [id number]  HPI Comments: [name] [name] is a 64 year old gentleman with long history of smoking, borderline diabetes, seen in the hospital 03/24/2014 for chest pain, troponin of 12, non-STEMI, taken to the cardiac catheterization lab that showed occluded mid RCA, occluded mid circumflex, severe proximal LAD disease, also occluded diagonal vessel that was felt to be the culprit, ejection fraction 35%. He was transferred to [facility] for  CABG, with procedure 03/28/2014 He presents today for follow-up of his coronary artery disease He stopped smoking around the time of his bypass surgery  In follow-up he reports that he is doing well. He does report having continued left shoulder discomfort since the bypass surgery. Initially had severely decreased range of motion. In the past 7 months, has regained much of that range of motion, some discomfort reaching behind his back. He did not seek out and evaluation by orthopedics. No imaging studies. Otherwise reports doing well. He is concerned about the cost of brilinta. Recent insurance change and would like to change to the generic if possible. Reports his blood pressure and heart rate have been relatively well-controlled  EKG on today's visit shows normal sinus rhythm with rate 53 bpm, nonspecific ST and T wave abnormality anterolateral leads V3 through V6, 1 and aVL  Other past medical history Cardiac catheterization report details 100% occluded proximal diagonal vessel, occluded mid RCA, occluded mid circumflex, severe proximal LAD disease with collaterals from left to right suggesting CTO, left to left collaterals to OM 3 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  FFR was performed on the LAD and this was significant.  No Known Allergies  Outpatient Encounter Prescriptions as of 10/13/2014  Medication Sig  . aspirin 81 MG EC tablet Take 1 tablet (81 mg total) by mouth daily.  Marland Kitchen lisinopril  (PRINIVIL,ZESTRIL) 2.5 MG tablet TAKE 1 TABLET BY MOUTH EVERY DAY  . metoprolol tartrate (LOPRESSOR) 25 MG tablet Take 0.5 tablets (12.5 mg total) by mouth 2 (two) times daily.  . Multiple Vitamins-Minerals (MULTIVITAMIN WITH MINERALS) tablet Take 1 tablet by mouth daily.  . OMEGA 3 1000 MG CAPS Take 2,000 mg by mouth daily.  . pravastatin (PRAVACHOL) 40 MG tablet Take 40 mg by mouth at bedtime.  . ticagrelor (BRILINTA) 90 MG TABS tablet Take 1 tablet (90 mg total) by mouth 2 (two) times daily.  . traMADol (ULTRAM) 50 MG tablet Take 1 tablet (50 mg total) by mouth every 6 (six) hours as needed for moderate pain.  . [DISCONTINUED] lisinopril (PRINIVIL,ZESTRIL) 2.5 MG tablet TAKE 1 TABLET BY MOUTH EVERY DAY  . [DISCONTINUED] lisinopril (PRINIVIL,ZESTRIL) 2.5 MG tablet TAKE 1 TABLET BY MOUTH EVERY DAY (Patient not taking: Reported on 10/13/2014)    Past Medical History  Diagnosis Date  . Acute non-Q wave ST elevation myocardial infarction (STEMI) 9/13-14/2015  . Ischemic cardiomyopathy     a. 03/2014 EF 35% by LV gram.  . Essential hypertension   . Hyperlipidemia with target LDL less than 70   . Tobacco abuse   . GERD (gastroesophageal reflux disease)   . 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 Atherosclerotic heart disease of native coronary artery with unstable angina pectoris     a. 03/2014 NSTEMI/Cath: LM nl, LAD 62m (FFR 0.76), D1 168m, LCX 145m, RCA 142m, EF  35%  . Peripheral vision loss     right eye     Past Surgical History  Procedure Laterality Date  . Adenoidectomy    . Coronary artery bypass graft N/A 03/28/2014    Procedure: CORONARY ARTERY BYPASS GRAFTING (CABG) x  4 using left internal mammary artery and bilateral saphenous leg vein.;  Surgeon: Rexene Alberts, MD;  Location: [facility];  Service: Open Heart Surgery;  Laterality: N/A;  . Intraoperative transesophageal echocardiogram N/A 03/28/2014     Procedure: INTRAOPERATIVE TRANSESOPHAGEAL ECHOCARDIOGRAM;  Surgeon: Rexene Alberts, MD;  Location: [facility];  Service: Open Heart Surgery;  Laterality: N/A;  . Cardiac catheterization      a. 03/2014 NSTEMI/Cath: LM nl, LAD 53m (FFR 0.76), D1 165m, LCX 138m, RCA 174m, EF 35%    Social History  reports that he quit smoking about 6 months ago. His smoking use included Cigarettes. He has a 40 pack-year smoking history. He has never used smokeless tobacco. He reports that he drinks alcohol. He reports that he does not use illicit drugs.  Family History Family history is unknown by patient.   Review of Systems  HENT: Negative.   Eyes: Negative.   Respiratory: Negative.   Cardiovascular: Negative.   Gastrointestinal: Negative.   Musculoskeletal: Positive for arthralgias.       Left shoulder pain with slight decreased range of motion   Skin: Negative.   Allergic/Immunologic: Negative.   Neurological: Negative.   Hematological: Negative.   Psychiatric/Behavioral: Negative.   All other systems reviewed and are negative.   BP 112/64 mmHg  Pulse 53  Ht 6' (1.829 m)  Wt 190 lb 8 oz (86.41 kg)  BMI 25.83 kg/m2  Physical Exam  Constitutional: He is oriented to person, place, and time. He appears well-developed and well-nourished.  HENT:  Head: Normocephalic.  Nose: Nose normal.  Mouth/Throat: Oropharynx is clear and moist.  Eyes: Conjunctivae are normal. Pupils are equal, round, and reactive to light.  Neck: Normal range of motion. Neck supple. No JVD present.  Cardiovascular: Normal rate, regular rhythm, S1 normal, S2 normal, normal heart sounds and intact distal pulses.  Exam reveals no gallop and no friction rub.   No murmur heard. Well healed mediastinal scar  Pulmonary/Chest: Effort normal and breath sounds normal. No respiratory distress. He has no wheezes. He has no rales. He exhibits no tenderness.  Abdominal: Soft. Bowel sounds are normal. He exhibits no distension. There is no  tenderness.  Musculoskeletal: Normal range of motion. He exhibits no edema or tenderness.  Mildly decreased range of motion of the left arm when reaching behind his back  Lymphadenopathy:    He has no cervical adenopathy.  Neurological: He is alert and oriented to person, place, and time. Coordination normal.  Skin: Skin is warm and dry. No rash noted. No erythema.  Psychiatric: He has a normal mood and affect. His behavior is normal. Judgment and thought content normal.      Assessment and Plan   Nursing note and vitals reviewed.

## 2014-10-14 ENCOUNTER — Ambulatory Visit: Payer: Self-pay | Admitting: Cardiovascular Disease

## 2014-11-01 NOTE — Discharge Summary (Signed)
PATIENT NAME:  [name] [name], [name] [name] MR#:  [id number] DATE OF BIRTH:  24-Jul-1950  DATE OF ADMISSION:  03/24/2014 DATE OF DISCHARGE:  03/26/2014   DISPOSITION: The patient is being transferred to [facility].   DIAGNOSES AT DISCHARGE: As follows:  1. Non-ST-elevation myocardial infarction. Severe 3-vessel coronary disease requiring suspected coronary artery bypass graft surgery.  2. Hypertension.  3. Hyperlipidemia.  4. Leukocytosis.   The patient is being transferred to [facility] for evaluation for possible coronary artery bypass graft surgery.   CURRENT MEDICATIONS: As follows:  1. Heparin drip per nomogram. 2. Aspirin 325 mg daily.  3. Metoprolol tartrate 25 mg b.i.d. 4. Atorvastatin 80 mg daily.  5. Sublingual nitroglycerin as needed. 6. Zofran 4 mg IV q.4 hours as needed.   CONSULTANTS DURING THE HOSPITAL COURSE: Dr. Ida Rogue from cardiology.   PERTINENT STUDIES DONE DURING THE HOSPITAL COURSE:  A chest x-ray done on admission showing mild right infrahilar opacity, concerning for possible pneumonia.  A cardiac catheterization done showing severe 3-vessel coronary disease, occluded mid RCA, occluded mid left circumflex, occluded ostial diagonal vessel. There appears to be severe proximal LAD disease. There are collaterals from left to the right. Suspect diagonal occlusion is the culprit lesion for the presentation of the non-ST-elevation MI. EF of 35%.   HOSPITAL COURSE: This is a 64 year old male, with medical problems as mentioned above, who presented to the hospital with chest pain/tightness and ruled in with cardiac markers for a non-ST-elevation myocardial infarction.   1. Non-ST-elevation myocardial infarction. This was likely the cause of the patient's chest pain and heaviness on admission. The patient was started on aspirin, started on a heparin nomogram, started on some beta blocker, statin, morphine and oxygen. The patient was seen by cardiology. Underwent a cardiac  catheterization done on September 15th which showed significant 3-vessel coronary disease, as mentioned above. The patient is presently therefore urgently being transferred to [facility] for possible evaluation for coronary artery bypass graft surgery. The patient is currently chest pain-free and hemodynamically stable, but he is being maintained on a heparin drip.  2. Hypertension. The patient has remained hemodynamically stable. He will continue the metoprolol.  3. Hyperlipidemia. The patient is currently being maintained on a statin.  4. Leukocytosis. I suspect this is secondary to stress margination from the recent myocardial infarction and further needs to be followed. There is no evidence of any acute infectious source. There was some mention of possible pneumonia on a chest x-ray, but the patient has no acute upper respiratory symptoms presently.   CODE STATUS: The patient is a full code.   DISPOSITION: He is being transferred urgently to [facility], as mentioned.   TIME SPENT: 40 minutes.   ____________________________ Belia Heman. Verdell Carmine, MD vjs:lb D: 03/26/2014 10:19:37 ET T: 03/26/2014 11:00:12 ET JOB#: [id number]  cc: Belia Heman. Verdell Carmine, MD, <Dictator> Henreitta Leber MD ELECTRONICALLY SIGNED 03/31/2014 9:43

## 2014-11-01 NOTE — Consult Note (Signed)
General Aspect PCP: D. Hall Busing, MD Primary Cardiologist:  New _____________  64 y/o male w/o prior h/o CAD who presented to the ED today with c/p and elevated trop of 12. _____________  Past Medical History 1.  HTN 2.  HL 3.  Tob Abuse 4.  Colon Polyps ____________   Present Illness 65 y/o male with a prior h/o HTN, HL, and ongoing tobacco abuse.   He works as a Librarian, academic in a Clinical cytogeneticist and is generally pretty active without limitations.  Over the years, he has noted intermittent substernal chest discomfort and burning, that usually occurs after eating meals, especially after eating large portions of bread.  When this occurs, he usually takes an antacid and Ss resolve.  1 week ago today, he was playing golf and had reached the last hole.  There, he developed 7/10 substernal chest pressure and burning associated with diaphoresis.  He thought this was similar to his indigestion but lasted longer.  He went to the clubhouse and sat under an AC vent where Ss eventually eased off after ~ 40 mins.  He had no further chest discomfort for the remainder of the week, despite being outside and cutting down tree branches for a good portion of Saturday 9/12.  On Monday, 9/14, he awoke around 7:30 AM with recurrent 7/10 chest pressure and burning w/o associated Ss.  His wife had him take ASA without significant change in Ss.  Ss persisted all morning and he decided to go see his PCP after lunch.  From his PCP's office, he was referred to the ED where ECG was non-acute however troponin was elevated @ 12.  We were asked to see [name] last night, and after interviewing him, his wife noted that she is a pt of [facility] and they would prefer that he see Dr. Josefa Half.  We informed the secretarial staff and [facility] was contacted.  Pt then decided this morning that he'd rather see [place].    Overnight, troponin has risen to >40.  He's continued to have mild, 1/10 chest pressure.  He is willing to proceed with  catheterization.   Physical Exam:  GEN well developed, well nourished, no acute distress   HEENT hearing intact to voice, moist oral mucosa   NECK supple  no bruits/jvd   RESP normal resp effort  clear BS   CARD Regular rate and rhythm  Normal, S1, S2  No murmur   ABD denies tenderness  soft  normal BS   LYMPH negative neck   EXTR negative cyanosis/clubbing, negative edema, dp/pt 1+ bilat   SKIN normal to palpation   NEURO cranial nerves intact, motor/sensory function intact   PSYCH alert, A+O to time, place, person   Review of Systems:  Subjective/Chief Complaint Chest discomfort   General: No Complaints   Skin: No Complaints   ENT: No Complaints   Eyes: No Complaints   Neck: No Complaints   Respiratory: No Complaints   Cardiovascular: Chest pain or discomfort  Tightness   Gastrointestinal: No Complaints   Genitourinary: No Complaints   Vascular: No Complaints   Musculoskeletal: No Complaints   Neurologic: No Complaints   Hematologic: No Complaints   Endocrine: No Complaints   Psychiatric: No Complaints   Review of Systems: All other systems were reviewed and found to be negative   Medications/Allergies Reviewed Medications/Allergies reviewed   Family & Social History:  Family and Social History:  Family History Negative  Father died of muscle disorder ("like ALS") @  47.  Mother died of brain tumor in her late 60's.   Social History positive  tobacco, Has been smoking ~ 1ppd x 40-50 yrs.  Drinks one beer every night after work.  No drugs.   Place of Living Home  Lives locally with wife.  Works as Librarian, academic in Scientist, research (life sciences).  Plays golf at least twice/week.          Admit Diagnosis:   ACUTE MI: Onset Date: 25-Mar-2014, Status: Active, Description: ACUTE MI  Home Medications: Medication Instructions Status  pravastatin 20 mg oral tablet 1 tab(s) orally once a day (at bedtime) Active  diltiazem 120 mg/24 hours oral capsule, extended  release 1 cap(s) orally once a day Active  multivitamin 1 tab(s) orally once a day Active   Lab Results:  Thyroid:  15-Sep-15 04:59   Thyroid Stimulating Hormone 1.18 (0.45-4.50 (IU = International Unit)  ----------------------- Pregnant patients have  different reference  ranges for TSH:  - - - - - - - - - -  Pregnant, first trimetser:  0.36 - 2.50 uIU/mL)  Cardiology:  15-Sep-15 07:26   Ventricular Rate 57  Atrial Rate 57  P-R Interval 148  QRS Duration 94  QT 440  QTc 428  P Axis 64  R Axis 58  T Axis -170  ECG interpretation Sinus bradycardia Possible Left atrial enlargement Septal infarct , age undetermined ST & T wave abnormality, consider lateral ischemia Abnormal ECG No previous ECGs available Confirmed by Rockey Situ, TIMOTHY ([id number]) on 03/25/2014 8:38:34 AM  Overreader: Ida Rogue  Routine Chem:  15-Sep-15 04:59   Cholesterol, Serum 131  Triglycerides, Serum 61  HDL (INHOUSE) 42  VLDL Cholesterol Calculated 12  LDL Cholesterol Calculated 77 (Result(s) reported on 25 Mar 2014 at 05:58AM.)  Glucose, Serum  116  BUN 10  Creatinine (comp) 0.87  Sodium, Serum 140  Potassium, Serum 4.0  Chloride, Serum 107  CO2, Serum 25  Calcium (Total), Serum  8.4  Anion Gap 8  Osmolality (calc) 279  eGFR (African American) >60  eGFR (Non-African American) >60 (eGFR values <61m/min/1.73 m2 may be an indication of chronic kidney disease (CKD). Calculated eGFR is useful in patients with stable renal function. The eGFR calculation will not be reliable in acutely ill patients when serum creatinine is changing rapidly. It is not useful in  patients on dialysis. The eGFR calculation may not be applicable to patients at the low and high extremes of body sizes, pregnant women, and vegetarians.)  Hemoglobin A1c ([facility]) 5.8 (The American Diabetes Association recommends that a primary goal of therapy should be <7% and that physicians should reevaluate the treatment regimen in  patients with HbA1c values consistently >8%.)  Cardiac:  14-Sep-15 15:36   Troponin I  12.00 (0.00-0.05 0.05 ng/mL or less: NEGATIVE  Repeat testing in 3-6 hrs  if clinically indicated. >0.05 ng/mL: POTENTIAL  MYOCARDIAL INJURY. Repeat  testing in 3-6 hrs if  clinically indicated. NOTE: An increase or decrease  of 30% or more on serial  testing suggests a  clinically important change)  CPK-MB, Serum  69.3 (Result(s) reported on 24 Mar 2014 at 04:17PM.)    19:36   Troponin I  34.00 (0.00-0.05 0.05 ng/mL or less: NEGATIVE  Repeat testing in 3-6 hrs  if clinically indicated. >0.05 ng/mL: POTENTIAL  MYOCARDIAL INJURY. Repeat  testing in 3-6 hrs if  clinically indicated. NOTE: An increase or decrease  of 30% or more on serial  testing suggests a  clinically important change)  CPK-MB, Serum  130.0 (Result(s) reported on 24 Mar 2014 at 08:04PM.)    23:25   Troponin I  > 40.00 (0.00-0.05 0.05 ng/mL or less: NEGATIVE  Repeat testing in 3-6 hrs  if clinically indicated. >0.05 ng/mL: POTENTIAL  MYOCARDIAL INJURY. Repeat  testing in 3-6 hrs if  clinically indicated. NOTE: An increase or decrease  of 30% or more on serial  testing suggests a  clinically important change)  CPK-MB, Serum  153.2 (Result(s) reported on 24 Mar 2014 at 11:58PM.)  Routine Hem:  15-Sep-15 04:59   WBC (CBC)  13.7  RBC (CBC) 4.51  Hemoglobin (CBC) 14.0  Hematocrit (CBC) 41.7  Platelet Count (CBC) 205  MCV 93  MCH 31.1  MCHC 33.6  RDW 13.6  Neutrophil % 73.2  Lymphocyte % 15.7  Monocyte % 10.2  Eosinophil % 0.6  Basophil % 0.3  Neutrophil #  10.0  Lymphocyte # 2.2  Monocyte #  1.4  Eosinophil # 0.1  Basophil # 0.0 (Result(s) reported on 25 Mar 2014 at 05:38AM.)   EKG:  Interpretation EKG shows NSR with T weave ABN in anterolateral leads   Radiology Results:  XRay:    14-Sep-15 15:48, Chest PA and Lateral  Chest PA and Lateral   REASON FOR EXAM:    chest pain  COMMENTS:        PROCEDURE: DXR - DXR CHEST PA (OR AP) AND LATERAL  - Mar 24 2014  3:48PM     CLINICAL DATA:  Chest pain.    EXAM:  CHEST  2 VIEW    COMPARISON:  None.    FINDINGS:  The heart size and mediastinal contours are within normal limits.  Left lung is clear. Mild right infrahilar opacity is noted  concerning for possible pneumonia. No pneumothorax or pleural  effusion is noted. The visualized skeletal structures are  unremarkable.     IMPRESSION:  Mild right infrahilar opacity is noted concerning for possible  pneumonia. Followup radiographs are recommended to ensure  resolution.      Electronically Signed    By: Sabino Dick M.D.    On: 03/24/2014 15:54       Verified By: Marveen Reeks, M.D.,    No Known Allergies:   Vital Signs/Nurse's Notes: **Vital Signs.:   15-Sep-15 06:15  Vital Signs Type Routine  Temperature Temperature (F) 98.4  Celsius 36.8  Temperature Source oral  Respirations Respirations 22  Systolic BP Systolic BP 383  Diastolic BP (mmHg) Diastolic BP (mmHg) 66  Mean BP 82  Pulse Ox % Pulse Ox % 95  Pulse Ox Activity Level  At rest  Oxygen Delivery Room Air/ 21 %    Impression 1. NSTEMI/ACS:   Pt presented after ~ 6 hrs of ongoing chest pain and pressure.  Troponin has risen to >40.  ECG non-acute on admission.  He currently reports 1/10 c/p but has been hemodynamically stable.  He is agreeable to proceed with diagnostic cathterization +/- PCI if appropriate today.   Currently pain free --Cont asa, statin, bb, heparin.   --Add NTP with ongoing c/p. Eventual cardiac rehab. cardiac acth scheduled for 1:30pm today  2.  HTN:   Stable.  Cont BB. He is on Diltiazem 420 mg daily at home  3. HL:   LDL 77.  Switch to lipitor 80.  LFT's ok. he was on prvastatin 40 mg daily at home  4.  Tob Abuse:  Cessation advised.   Electronic Signatures: Rogelia Mire (NP)  (Signed 15-Sep-15 08:56)  Authored: General Aspect/Present Illness, History and  Physical Exam, Review of System, Family & Social History, Home Medications, Labs, EKG , Allergies, Vital Signs/Nurse's Notes, Impression/Plan Ida Rogue (MD)  (Signed 15-Sep-15 09:27)  Authored: General Aspect/Present Illness, History and Physical Exam, Review of System, Family & Social History, Health Issues, Labs, EKG , Radiology, Vital Signs/Nurse's Notes, Impression/Plan  Co-Signer: General Aspect/Present Illness, Home Medications, Allergies   Last Updated: 15-Sep-15 09:27 by Ida Rogue (MD)

## 2014-11-01 NOTE — H&P (Signed)
PATIENT NAME:  [name] [name], [name] [name] MR#:  [id number] DATE OF BIRTH:  08-30-1950  DATE OF ADMISSION:  03/24/2014  PRIMARY CARE PROVIDER: Dr. Benita Stabile.  EMERGENCY DEPARTMENT REFERRING PHYSICIAN: Dr. Jimmye Norman.  CHIEF COMPLAINT: Chest pressure.   HISTORY OF PRESENT ILLNESS: The patient is a 64 year old white male with a history of hypertension, hyperlipidemia, nicotine addiction, who states that he has had issues with substernal burning sensation ongoing for the past few years usually takes antacid medication and it goes away. This morning he woke up with substernal pressure and it continued to persist throughout the day. He was seen by his primary care provider and was recommended to come to the ED. The patient also reports that associated with the pressure he also felt unusual in his mouth, but did not have any jaw pain. Did not have any numbness or tingling, or no nausea or vomiting. He denies any shortness of breath. The patient came to the ED and was noted to have a troponin that is elevated at 12, and he is noted to have a non-ST myocardial infarction. The ED physician discussed the case with Dr. Rockey Situ who recommended starting patient on heparin and aspirin and recommended admission. The patient does continue to have some substernal discomfort but not as severe as before. He is not currently short of breath. He denies any fevers, chills. No cough.   PAST MEDICAL HISTORY:  1. Hypertension.  2. History of heart murmur.  3. History of hyperlipidemia.   PAST SURGICAL HISTORY: None.   ALLERGIES: None.   SOCIAL HISTORY: Smokes a half pack per day a few times a week, according to him. Used to smoke much more. Drinks socially. No drug use.   FAMILY HISTORY: Grandmother with heart issues. Mother and father without any heart-related disease.    MEDICATIONS AT HOME: He is on pravastatin 20 daily, multivitamin 1 tablet daily, diltiazem 120 daily.   REVIEW OF SYSTEMS:  CONSTITUTIONAL: Denies any fevers,  fatigue, weakness. No weight loss. No weight gain.  EYES: No blurred or double vision. No redness. No inflammation. No glaucoma. No cataracts.  ENT: No tinnitus. No ear pain. No hearing loss. No seasonal or year-round allergies. No epistaxis. No difficulty swallowing.  RESPIRATORY: Denies any cough, wheezing, hemoptysis. No dyspnea. No asthma. No painful respiration. No COPD, no TB.  CARDIOVASCULAR: Complains of chest pressure. No orthopnea. No edema. No arrhythmia. No palpitation. No syncope.  GASTROINTESTINAL: No nausea, vomiting, diarrhea. No abdominal pain. No hematemesis. No melena. No ulcer. Reports GERD symptoms.  GENITOURINARY: Denies any dysuria, hematuria, renal colic, or frequency.  ENDOCRINE: Denies any polyuria, nocturia, or thyroid problems.  HEMATOLOGIC AND LYMPHATIC: Denies anemia, easy bruisability, or bleeding.  SKIN: No acne. No [name] [name].  MUSCULOSKELETAL: Denies any pain in the neck, back, or shoulder.  NEUROLOGIC: No numbness, CVA, TIA, or seizures.  PSYCHIATRIC: No anxiety, insomnia, or ADD.   PHYSICAL EXAMINATION:  VITAL SIGNS: Temperature 98.6, pulse 64, respirations 19, blood pressure 139/76, O2 at 98%.  GENERAL: The patient is a well-built, well-nourished male in no acute distress.  HEENT: Head atraumatic, normocephalic. Pupils equally round, reactive to light and accommodation. There is no conjunctival pallor. No scleral icterus. Nasal exam shows no drainage or ulceration. External ear exam shows no erythema or drainage.  NECK: Supple without any JVD. No thyromegaly. Full range of motion intact.  CARDIOVASCULAR: Regular rate and rhythm. No murmurs, rubs, clicks, or gallops.  LUNGS: Clear to auscultation bilaterally without any rales, rhonchi, or wheezing.  ABDOMEN:  Soft, nontender, nondistended. Positive bowel sounds x 4. No hepatosplenomegaly.  EXTREMITIES: No clubbing, cyanosis, or edema.  SKIN: No [name] [name].  LYMPHATICS: No lymph nodes palpable.  VASCULAR: Good DP, PT  pulses. LYMPHATIC: Lymph node: None are palpable.   LABORATORY DATA: Glucose 110, BUN 12, creatinine 1.30, sodium 139, potassium 4.1, chloride 107, CO2 is 23, calcium 8.3. LFTs are normal except AST of 102, CPK is 801, CK-MB is 69.3, troponin 12.0. WBC 10.9, hemoglobin 14.8, platelet count 227,000. INR 1.1. EKG shows normal sinus rhythm with nonspecific ST-T wave changes. Possible left atrial enlargement.   ASSESSMENT AND PLAN: The patient is a 64 year old white male with history of hypertension, hyperlipidemia has been having heartburn-like symptoms for a few years, now developed chest pain, noted to have non-ST myocardial infarction.  1. Non-ST myocardial infarction. Will treat with IV heparin as well as aspirin. The patient will be started on low-dose metoprolol and nitroglycerin. Cardiology will see the patient for evaluation for likely cardiac catheter.  2. Hyperlipidemia. Let us go ahead and place him on pravastatin 40 at bedtime. Fasting lipid panel in the a.m.  3. Hypertension. We will change verapamil to metoprolol.  4. Abnormal chest x-ray. We will do a PA and lateral chest x-ray in the a.m.  5. Nicotine addiction. Smoking cessation done, 4 minutes spent. Recommend patient stop smoking. Nicotine patch offered to the patient.   TIME SPENT: 60 minutes.    ____________________________ Lafonda Mosses Posey Pronto, MD shp:lt D: 03/24/2014 17:08:06 ET T: 03/24/2014 17:47:50 ET JOB#: [id number]  cc: Waylynn Benefiel H. Posey Pronto, MD, <Dictator> Alric Seton MD ELECTRONICALLY SIGNED 03/28/2014 8:42

## 2015-04-06 ENCOUNTER — Other Ambulatory Visit: Payer: Self-pay | Admitting: *Deleted

## 2015-04-06 ENCOUNTER — Encounter: Payer: Self-pay | Admitting: Thoracic Surgery (Cardiothoracic Vascular Surgery)

## 2015-04-06 ENCOUNTER — Ambulatory Visit (INDEPENDENT_AMBULATORY_CARE_PROVIDER_SITE_OTHER): Payer: BLUE CROSS/BLUE SHIELD | Admitting: Thoracic Surgery (Cardiothoracic Vascular Surgery)

## 2015-04-06 VITALS — BP 129/76 | HR 59 | Resp 16 | Ht 72.0 in | Wt 188.0 lb

## 2015-04-06 DIAGNOSIS — Z951 Presence of aortocoronary bypass graft: Secondary | ICD-10-CM

## 2015-04-06 MED ORDER — LISINOPRIL 2.5 MG PO TABS: 2.5000 mg | ORAL_TABLET | Freq: Every day | ORAL | Status: DC

## 2015-04-06 NOTE — Progress Notes (Signed)
[address]       [place] [address]             [phone]     CARDIOTHORACIC SURGERY OFFICE NOTE  Referring Provider is Minna Merritts, MD PCP is Albina Billet, MD   HPI:  Patient returns for routine follow-up up proximally one year status post coronary artery bypass grafting 4 on 03/28/2014 for severe three-vessel coronary artery disease status post acute non-ST segment elevation myocardial infarction. His postoperative recovery was entirely uncomplicated and he was last seen here in our office on 04/28/2014. Since then he has continued to do very well. He was seen in follow-up by Dr. Rockey Situ last April and scheduled for routine follow-up again next month. The patient has managed to quit smoking altogether. He reports that he feels much improved in comparison with prior to surgery. He does not get short of breath or chest discomfort with any physical activity and he is back doing much more than he was capable of doing prior to surgery. He denies any history of chest pain or chest tightness with activity. He denies any exertional shortness of breath. He has had problems with stiffness in both shoulders ever since the surgery, but this has slowly improved and no longer bothers him significantly.  He states that at present is only significant physical limitation is that of bilateral calf claudication. He states that this only happens with prolonged episodes of walking such as mowing the lawn. This does not bother him much symptoms are stable.   Current Outpatient Prescriptions  Medication Sig Dispense Refill  . aspirin 81 MG EC tablet Take 1 tablet (81 mg total) by mouth daily. 30 tablet 0  . lisinopril (PRINIVIL,ZESTRIL) 2.5 MG tablet Take 1 tablet (2.5 mg total) by mouth daily. 30 tablet 3  . metoprolol tartrate (LOPRESSOR) 25 MG tablet Take 0.5 tablets (12.5 mg total) by mouth 2 (two) times daily. 30 tablet 1  . Multiple Vitamins-Minerals (MULTIVITAMIN WITH  MINERALS) tablet Take 1 tablet by mouth daily.    . OMEGA 3 1000 MG CAPS Take 2,000 mg by mouth daily.    . pravastatin (PRAVACHOL) 40 MG tablet Take 40 mg by mouth at bedtime.    . ticagrelor (BRILINTA) 90 MG TABS tablet Take 1 tablet (90 mg total) by mouth 2 (two) times daily. 60 tablet 11   No current facility-administered medications for this visit.      Physical Exam:   BP 129/76 mmHg  Pulse 59  Resp 16  Ht 6' (1.829 m)  Wt 188 lb (85.276 kg)  BMI 25.49 kg/m2  SpO2 98%  General:  Well-appearing  Chest:   clear  CV:   Regular rate and rhythm without murmur  Incisions:  Completely healed, sternum is stable  Abdomen:  Soft and nontender  Extremities:  Warm and well-perfused. Patient has good range of motion of both shoulders  Diagnostic Tests:  n/a   Impression:  Patient is doing very well proximally one year status post coronary artery bypass grafting.  He does describe stable symptoms of bilateral calf claudication, but this occurs only with more strenuous activity and does not seem to limit his lifestyle to any significant degree.  Plan:  In the future the patient will call and return to see [place] as needed. He has been reminded regarding the benefits of a heart healthy lifestyle and attention to management of all related conditions such as hyperlipidemia.  I spent in excess  of 15 minutes during the conduct of this office consultation and >50% of this time involved direct face-to-face encounter with the patient for counseling and/or coordination of their care.  Valentina Gu. Roxy Manns, MD 04/06/2015 12:24 PM

## 2015-04-06 NOTE — Patient Instructions (Signed)
Make every effort to maintain a "heart-healthy" lifestyle with regular physical exercise and adherence to a low-fat, low-carbohydrate diet.  Continue to seek regular follow-up appointments with your primary care physician and/or cardiologist.  Do not resume smoking cigarettes or any other tobacco products.    

## 2015-04-16 ENCOUNTER — Ambulatory Visit (INDEPENDENT_AMBULATORY_CARE_PROVIDER_SITE_OTHER): Payer: BLUE CROSS/BLUE SHIELD | Admitting: Cardiovascular Disease

## 2015-04-16 ENCOUNTER — Encounter: Payer: Self-pay | Admitting: Cardiovascular Disease

## 2015-04-16 VITALS — BP 110/80 | HR 55 | Ht 72.0 in | Wt 188.8 lb

## 2015-04-16 DIAGNOSIS — Z951 Presence of aortocoronary bypass graft: Secondary | ICD-10-CM

## 2015-04-16 DIAGNOSIS — I25110 Atherosclerotic heart disease of native coronary artery with unstable angina pectoris: Secondary | ICD-10-CM

## 2015-04-16 DIAGNOSIS — I255 Ischemic cardiomyopathy: Secondary | ICD-10-CM | POA: Diagnosis not present

## 2015-04-16 DIAGNOSIS — I214 Non-ST elevation (NSTEMI) myocardial infarction: Secondary | ICD-10-CM | POA: Diagnosis not present

## 2015-04-16 DIAGNOSIS — I10 Essential (primary) hypertension: Secondary | ICD-10-CM | POA: Diagnosis not present

## 2015-04-16 DIAGNOSIS — E785 Hyperlipidemia, unspecified: Secondary | ICD-10-CM

## 2015-04-16 MED ORDER — TICAGRELOR 60 MG PO TABS: 60.0000 mg | ORAL_TABLET | Freq: Two times a day (BID) | ORAL | Status: DC

## 2015-04-16 MED ORDER — ROSUVASTATIN CALCIUM 40 MG PO TABS: 40.0000 mg | ORAL_TABLET | Freq: Every day | ORAL | Status: DC

## 2015-04-16 NOTE — Assessment & Plan Note (Signed)
Currently with no symptoms of angina. One year following his bypass. No further testing

## 2015-04-16 NOTE — Assessment & Plan Note (Signed)
No further anginal symptoms. We have recommended he decrease his Brilinta down to 60 mg twice a

## 2015-04-16 NOTE — Progress Notes (Signed)
Patient ID: [name] [name], male    DOB: December 20, 1950, 64 y.o.   MRN: [id number]  HPI Comments: [name] is a 64 year old gentleman with long history of smoking, borderline diabetes, seen in the hospital 03/24/2014 for chest pain, troponin of 12, non-STEMI, taken to the cardiac catheterization lab that showed occluded mid RCA, occluded mid circumflex, severe proximal LAD disease, also occluded diagonal vessel that was felt to be the culprit, ejection fraction 35%. He was transferred to [place] for  CABG, with procedure 03/28/2014 He presents today for follow-up of his coronary artery disease He stopped smoking around the time of his bypass surgery   in follow-up today, he reports that he is doing well Denies any symptoms concerning for angina. Does not have a regular exercise program Diagnosis of sleep apnea, uses CPAP and feels that he is sleeping better Total cholesterol 151, hemoglobin A1c 6.1, LDL 94 He's taking pravastatin 40 mg daily  EKG on today's visit shows normal sinus rhythm with rate 55 bpm, T-wave abnormality in lead 1 and aVL  Other past medical history Cardiac catheterization report details 100% occluded proximal diagonal vessel, occluded mid RCA, occluded mid circumflex, severe proximal LAD disease with collaterals from left to right suggesting CTO, left to left collaterals to OM 3 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  FFR was performed on the LAD and this was significant.  No Known Allergies  Outpatient Encounter Prescriptions as of 04/16/2015  Medication Sig  . aspirin 81 MG EC tablet Take 1 tablet (81 mg total) by mouth daily.  Marland Kitchen lisinopril (PRINIVIL,ZESTRIL) 2.5 MG tablet Take 1 tablet (2.5 mg total) by mouth daily.  . metoprolol tartrate (LOPRESSOR) 25 MG tablet Take 0.5 tablets (12.5 mg total) by mouth 2 (two) times daily.  . Multiple Vitamins-Minerals (MULTIVITAMIN WITH MINERALS) tablet Take 1 tablet by mouth daily.  . ticagrelor (BRILINTA) 60 MG TABS tablet Take 1  tablet (60 mg total) by mouth 2 (two) times daily.  . [DISCONTINUED] OMEGA 3 1000 MG CAPS Take 2,000 mg by mouth daily.  . [DISCONTINUED] pravastatin (PRAVACHOL) 40 MG tablet Take 40 mg by mouth at bedtime.  . [DISCONTINUED] ticagrelor (BRILINTA) 90 MG TABS tablet Take 1 tablet (90 mg total) by mouth 2 (two) times daily.  . rosuvastatin (CRESTOR) 40 MG tablet Take 1 tablet (40 mg total) by mouth daily.   No facility-administered encounter medications on file as of 04/16/2015.    Past Medical History  Diagnosis Date  . Acute non-Q wave ST elevation myocardial infarction (STEMI) 9/13-14/2015  . Ischemic cardiomyopathy     a. 03/2014 EF 35% by LV gram.  . Essential hypertension   . Hyperlipidemia with target LDL less than 70   . Tobacco abuse   . GERD (gastroesophageal reflux disease)   . 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 Atherosclerotic heart disease of native coronary artery with unstable angina pectoris ([place])     a. 03/2014 NSTEMI/Cath: LM nl, LAD 74m (FFR 0.76), D1 129m, LCX 163m, RCA 124m, EF 35%  . Peripheral vision loss     right eye     Past Surgical History  Procedure Laterality Date  . Adenoidectomy    . Coronary artery bypass graft N/A 03/28/2014    Procedure: CORONARY ARTERY BYPASS GRAFTING (CABG) x  4 using left internal mammary artery and bilateral saphenous leg vein.;  Surgeon: Rexene Alberts, MD;  Location: [facility];  Service: Open Heart Surgery;  Laterality: N/A;  . Intraoperative transesophageal echocardiogram N/A 03/28/2014    Procedure: INTRAOPERATIVE TRANSESOPHAGEAL ECHOCARDIOGRAM;  Surgeon: Rexene Alberts, MD;  Location: [facility];  Service: Open Heart Surgery;  Laterality: N/A;  . Cardiac catheterization      a. 03/2014 NSTEMI/Cath: LM nl, LAD 53m (FFR 0.76), D1 170m, LCX 121m, RCA 122m, EF 35%    Social History  reports that he quit smoking about 12 months ago. His smoking use  included Cigarettes. He has a 40 pack-year smoking history. He has never used smokeless tobacco. He reports that he drinks alcohol. He reports that he does not use illicit drugs.  Family History Family history is unknown by patient.   Review of Systems  Respiratory: Negative.   Cardiovascular: Negative.   Gastrointestinal: Negative.   Musculoskeletal: Positive for arthralgias.  Allergic/Immunologic: Negative.   Neurological: Negative.   Hematological: Negative.   Psychiatric/Behavioral: Negative.   All other systems reviewed and are negative.   BP 110/80 mmHg  Pulse 55  Ht 6' (1.829 m)  Wt 188 lb 12 oz (85.616 kg)  BMI 25.59 kg/m2  Physical Exam  Constitutional: He is oriented to person, place, and time. He appears well-developed and well-nourished.  HENT:  Head: Normocephalic.  Nose: Nose normal.  Mouth/Throat: Oropharynx is clear and moist.  Eyes: Conjunctivae are normal. Pupils are equal, round, and reactive to light.  Neck: Normal range of motion. Neck supple. No JVD present.  Cardiovascular: Normal rate, regular rhythm, S1 normal, S2 normal, normal heart sounds and intact distal pulses.  Exam reveals no gallop and no friction rub.   No murmur heard. Well healed mediastinal scar  Pulmonary/Chest: Effort normal and breath sounds normal. No respiratory distress. He has no wheezes. He has no rales. He exhibits no tenderness.  Abdominal: Soft. Bowel sounds are normal. He exhibits no distension. There is no tenderness.  Musculoskeletal: Normal range of motion. He exhibits no edema or tenderness.  Lymphadenopathy:    He has no cervical adenopathy.  Neurological: He is alert and oriented to person, place, and time. Coordination normal.  Skin: Skin is warm and dry. No rash noted. No erythema.  Psychiatric: He has a normal mood and affect. His behavior is normal. Judgment and thought content normal.      Assessment and Plan   Nursing note and vitals reviewed.

## 2015-04-16 NOTE — Assessment & Plan Note (Signed)
Recommend we change him from pravastatin to Crestor 40 mg daily in an effort to get LDL less than 70 Currently LDL in the 90s

## 2015-04-16 NOTE — Patient Instructions (Addendum)
You are doing well.  Please hold the pravastatin Start the crestor one a day Hold the fish oil  Decrease the brilinta down to 60 mg twice a day Stay on aspirin 81 mg once a day  Please call us if you have new issues that need to be addressed before your next appt.  Your physician wants you to follow-up in: 6 months.  You will receive a reminder letter in the mail two months in advance. If you don't receive a letter, please call our office to schedule the follow-up appointment.

## 2015-04-16 NOTE — Assessment & Plan Note (Signed)
Blood pressure stable, no symptoms concerning for CHF. Blood pressure borderline low. No further changes at this time Could consider repeat echocardiogram in follow-up

## 2015-05-01 ENCOUNTER — Other Ambulatory Visit: Payer: Self-pay | Admitting: Cardiovascular Disease

## 2015-05-04 ENCOUNTER — Telehealth: Payer: Self-pay | Admitting: *Deleted

## 2015-05-04 MED ORDER — TICAGRELOR 60 MG PO TABS: 60.0000 mg | ORAL_TABLET | Freq: Two times a day (BID) | ORAL | Status: DC

## 2015-05-04 NOTE — Telephone Encounter (Signed)
  STAT if patient is at the pharmacy , call can be transferred to refill team.   1. Which medications need to be refilled? Berlinta  2. Which pharmacy/location is medication to be sent to? The wrong dosage was sent into [facility] for Berlinta should be 60mg  and 90mg  was called in.  3. Do they need a 30 day or 90 day supply? 90 day.   Please call patient

## 2015-05-04 NOTE — Telephone Encounter (Signed)
Left message on pt's vm w/ previous info. Advised her to contact pharmacy, as the correct dosage was sent in by Dr. Rockey Situ.

## 2015-05-04 NOTE — Telephone Encounter (Signed)
Brilinta 60 mg was sent in by Dr. Rockey Situ at pt's last ov 04/16/15. 90 day supply resubmitted to pharmacy.

## 2015-05-04 NOTE — Telephone Encounter (Signed)
Please see previous phone note.  

## 2015-05-04 NOTE — Telephone Encounter (Signed)
°  STAT if patient is at the pharmacy , call can be transferred to refill team.   1. Which medications need to be refilled? (BRILINTA 60 mg 2x a day) we sent in 90 mg which pt  was previously on. But we changed it recently did this she states it was even on the paper work we gave him.   2. Which pharmacy/location is medication to be sent to[facility] [place]   3. Do they need a 30 day or 90 day supply? 90 day   Pt wife calling stating pt paid $60 to pharmacy for the co pay and now they would like a refund, for it was sent it wrong, but [facility] said it was not their fault.  She is a bit upset for patient did not check this at the pharmacy and paid the amount and came home with it.  I stated to her we would do some investigation and try and see if we can help with this, and we would call her back and let her know.

## 2015-05-05 ENCOUNTER — Other Ambulatory Visit: Payer: Self-pay | Admitting: *Deleted

## 2015-05-05 MED ORDER — LISINOPRIL 2.5 MG PO TABS: 2.5000 mg | ORAL_TABLET | Freq: Every day | ORAL | Status: DC

## 2015-09-26 IMAGING — CR DG CHEST 2V
1 series · 2 of 2 positions shown · non-contrast
Comparison: None.

CLINICAL DATA: Chest pain.

EXAM:
CHEST  2 VIEW

[Series 1: dxr chest pa (or ap) and lateral · 0.14mm/px · 2 of 2 slices shown]
[im 1/2]
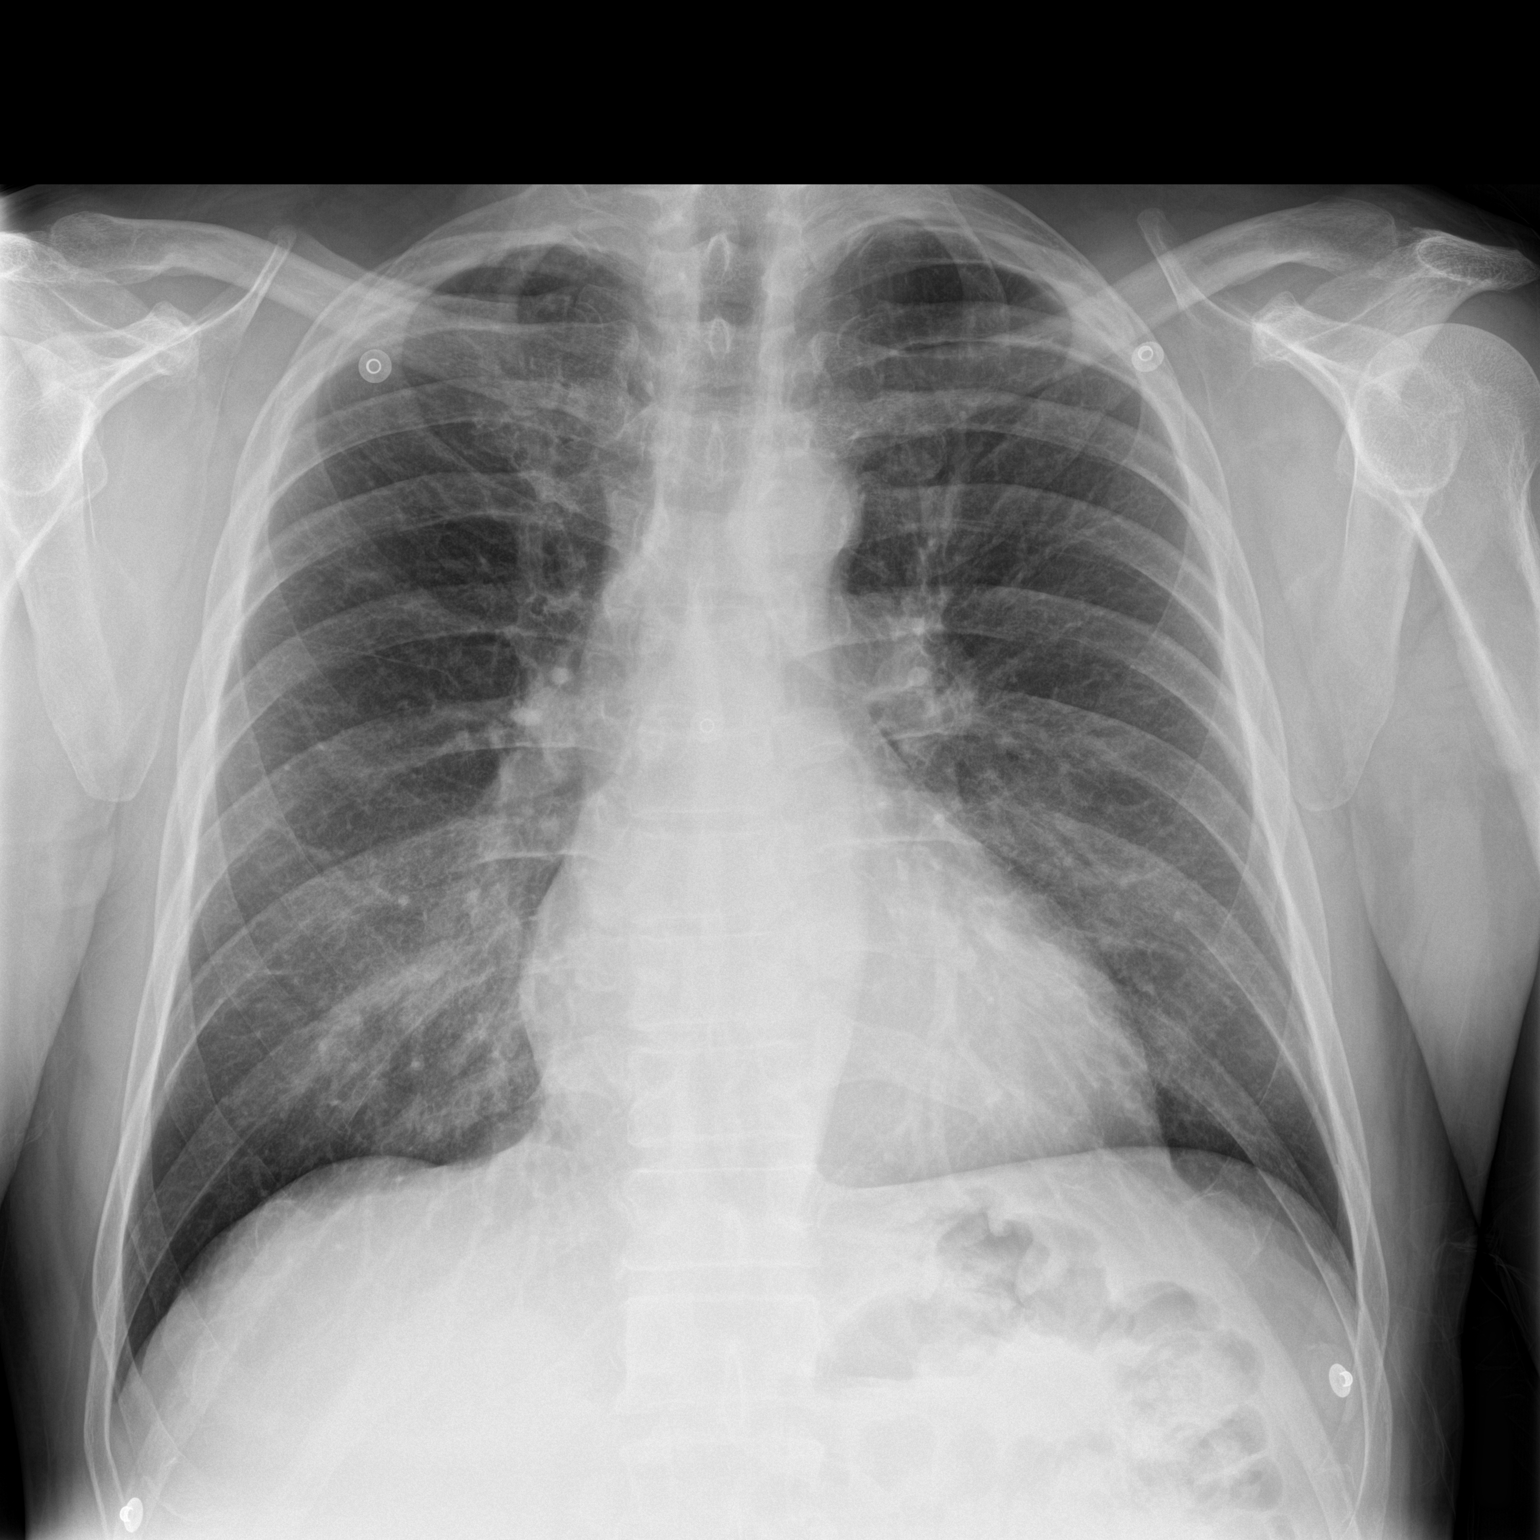
[im 2/2]
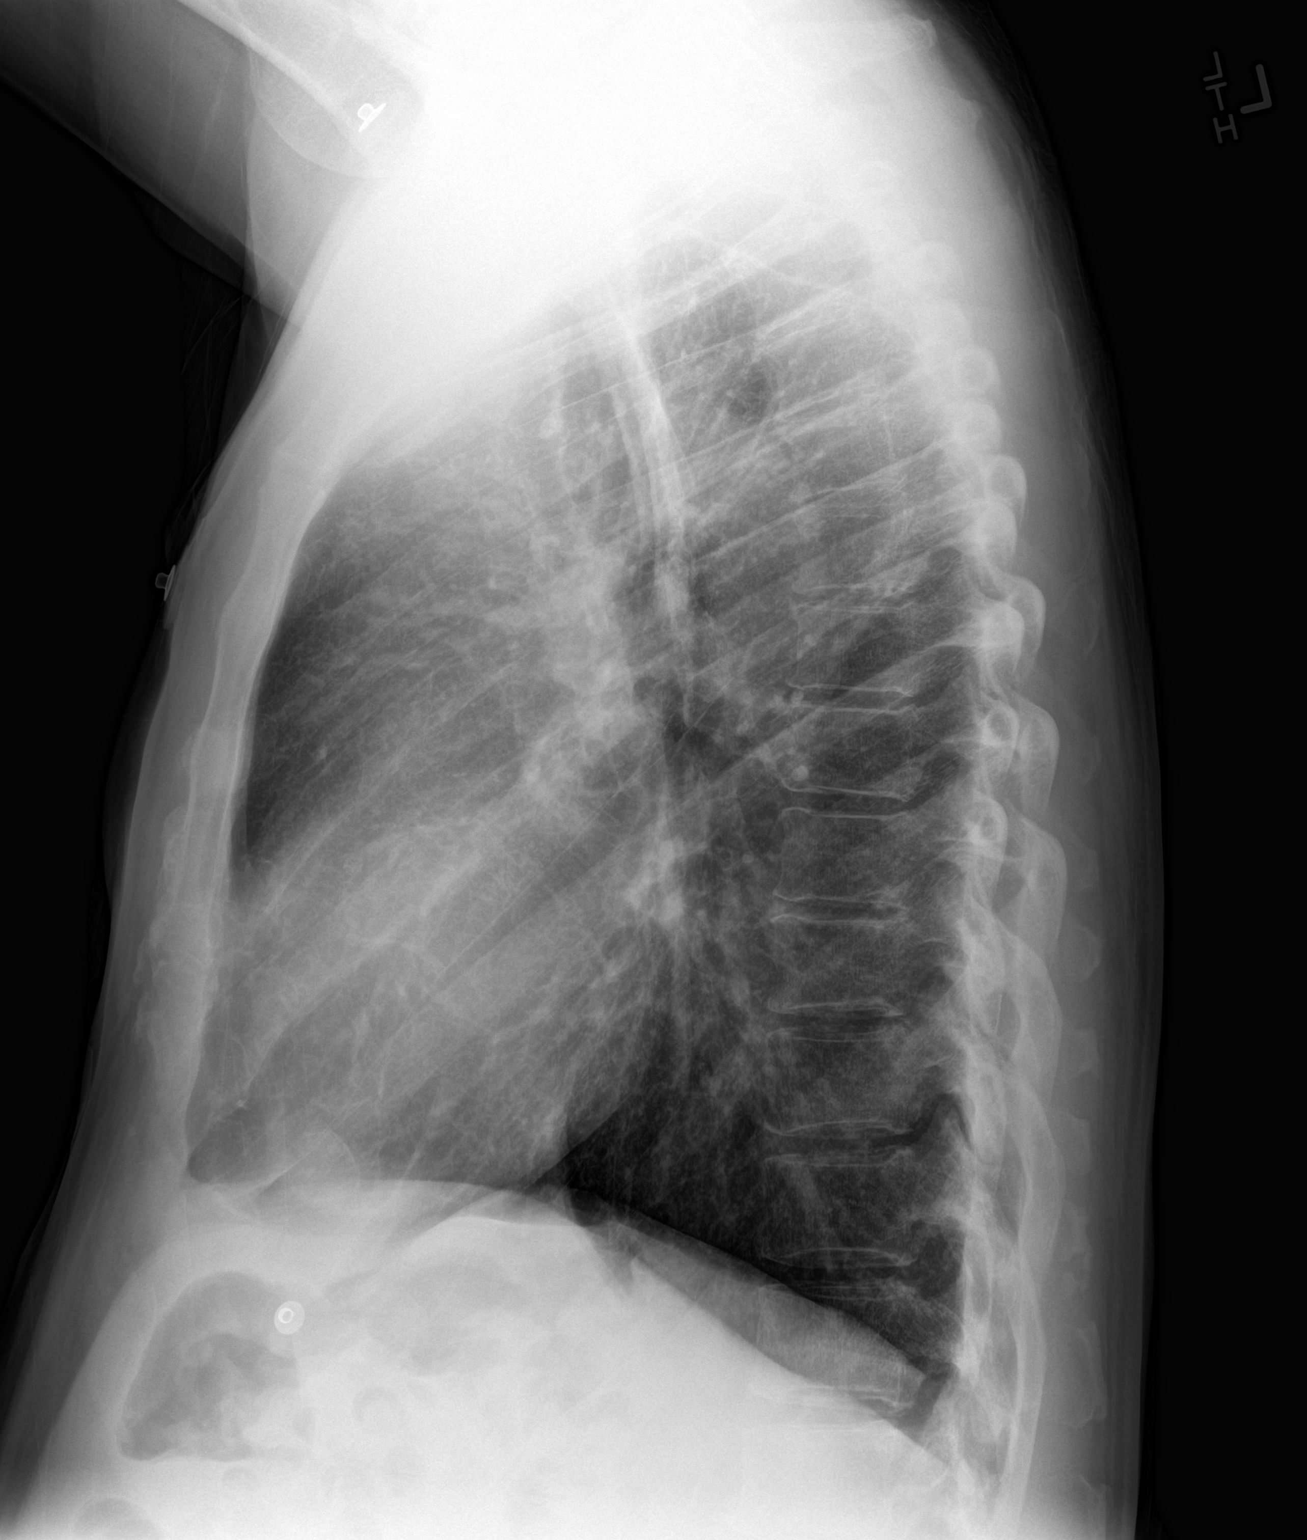

[2 of 2 positions shown; findings below may reference images not displayed]

FINDINGS: The heart size and mediastinal contours are within normal limits.
Left lung is clear. Mild right infrahilar opacity is noted
concerning for possible pneumonia. No pneumothorax or pleural
effusion is noted. The visualized skeletal structures are
unremarkable.
IMPRESSION: Mild right infrahilar opacity is noted concerning for possible
pneumonia. Followup radiographs are recommended to ensure
resolution.

## 2015-10-01 IMAGING — CR DG CHEST 1V PORT
1 series · 1 of 1 positions shown · non-contrast
Comparison: 03/28/2014

CLINICAL DATA: Postop day 1 status post CABG.

EXAM:
PORTABLE CHEST - 1 VIEW

[AP]
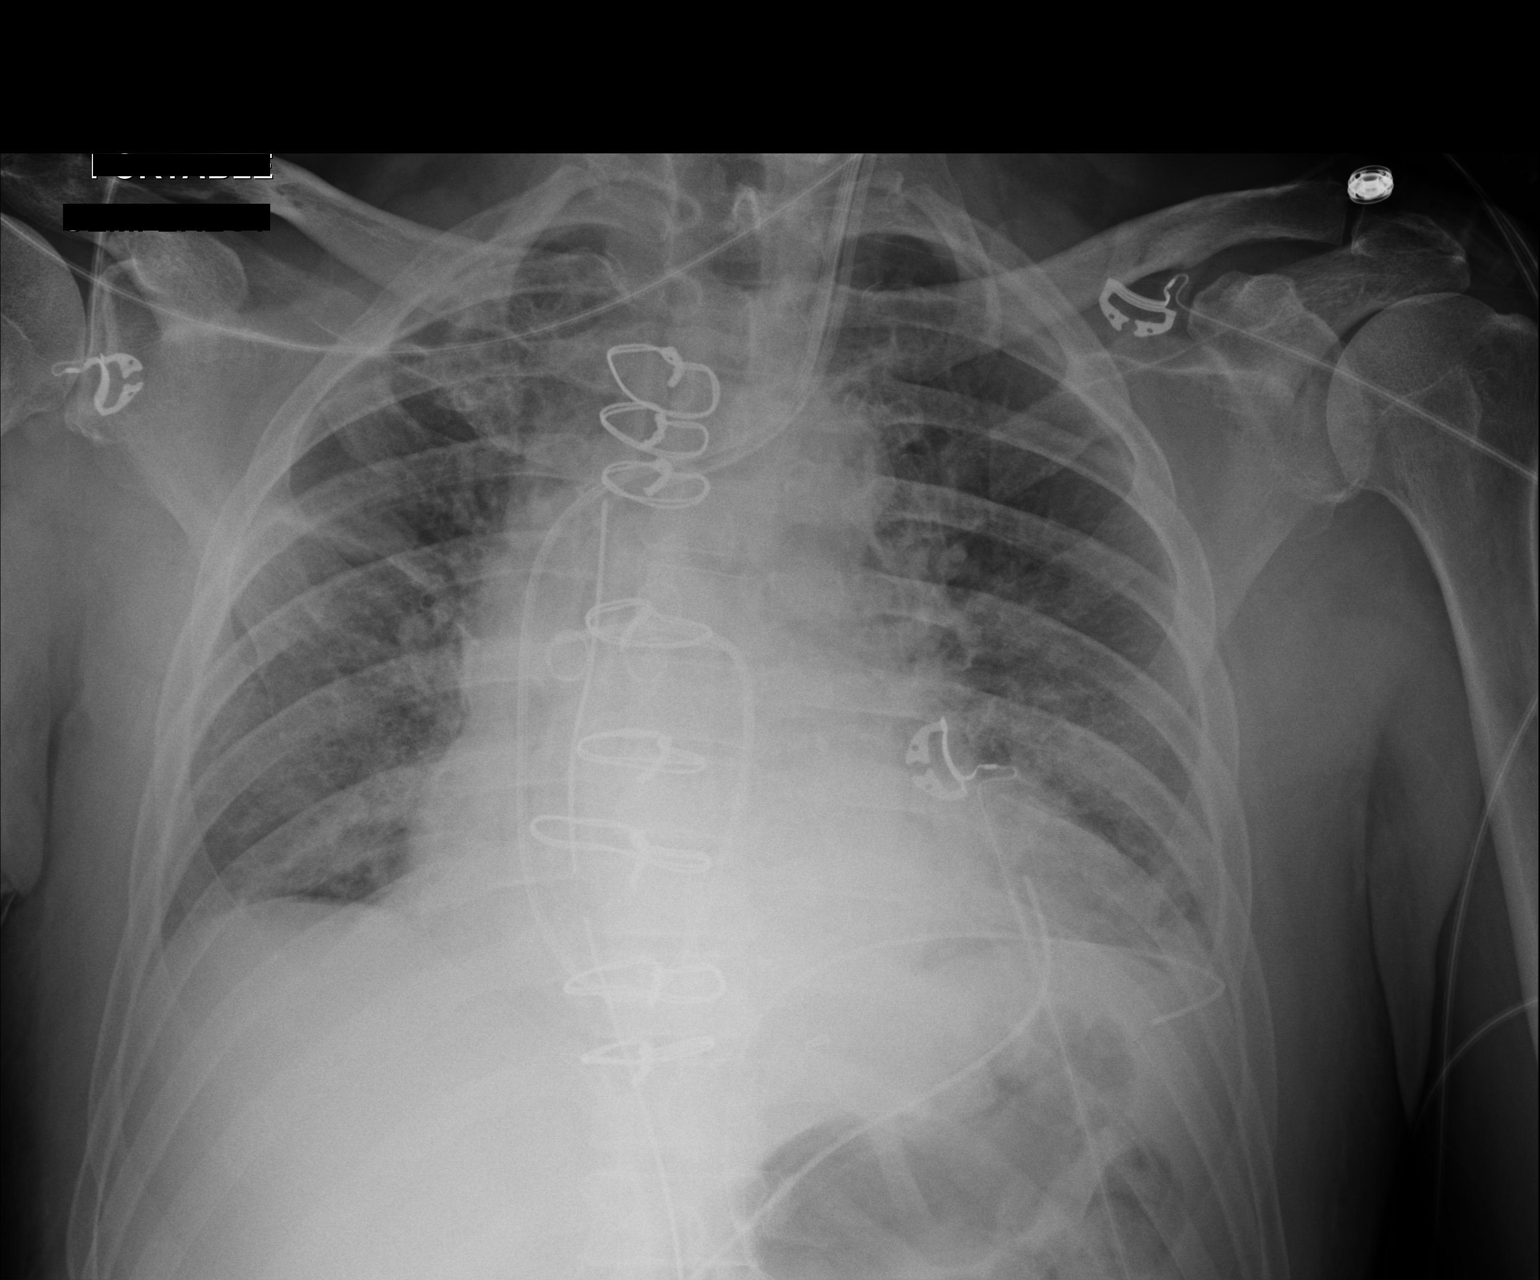

[1 of 1 positions shown; findings below may reference images not displayed]

FINDINGS: Endotracheal and nasogastric tubes have been removed. The remaining
tubes and drains appear stable. No significant pneumothorax.
Cardiomegaly observed with indistinct pulmonary vasculature and mild
interstitial edema which is increased compared to prior.

The right upper lobe atelectasis has resolved. Trace fluid in the
right minor fissure. Low lung volumes.
IMPRESSION: 1. Interval resolution of the right upper lobe atelectasis.
2. Mild interstitial edema, increased from prior.

## 2015-10-03 IMAGING — CR DG CHEST 2V
2 series · 2 of 2 positions shown · non-contrast
Comparison: 03/30/2014

CLINICAL DATA: Chest discomfort

EXAM:
CHEST  2 VIEW

[w chest pa]
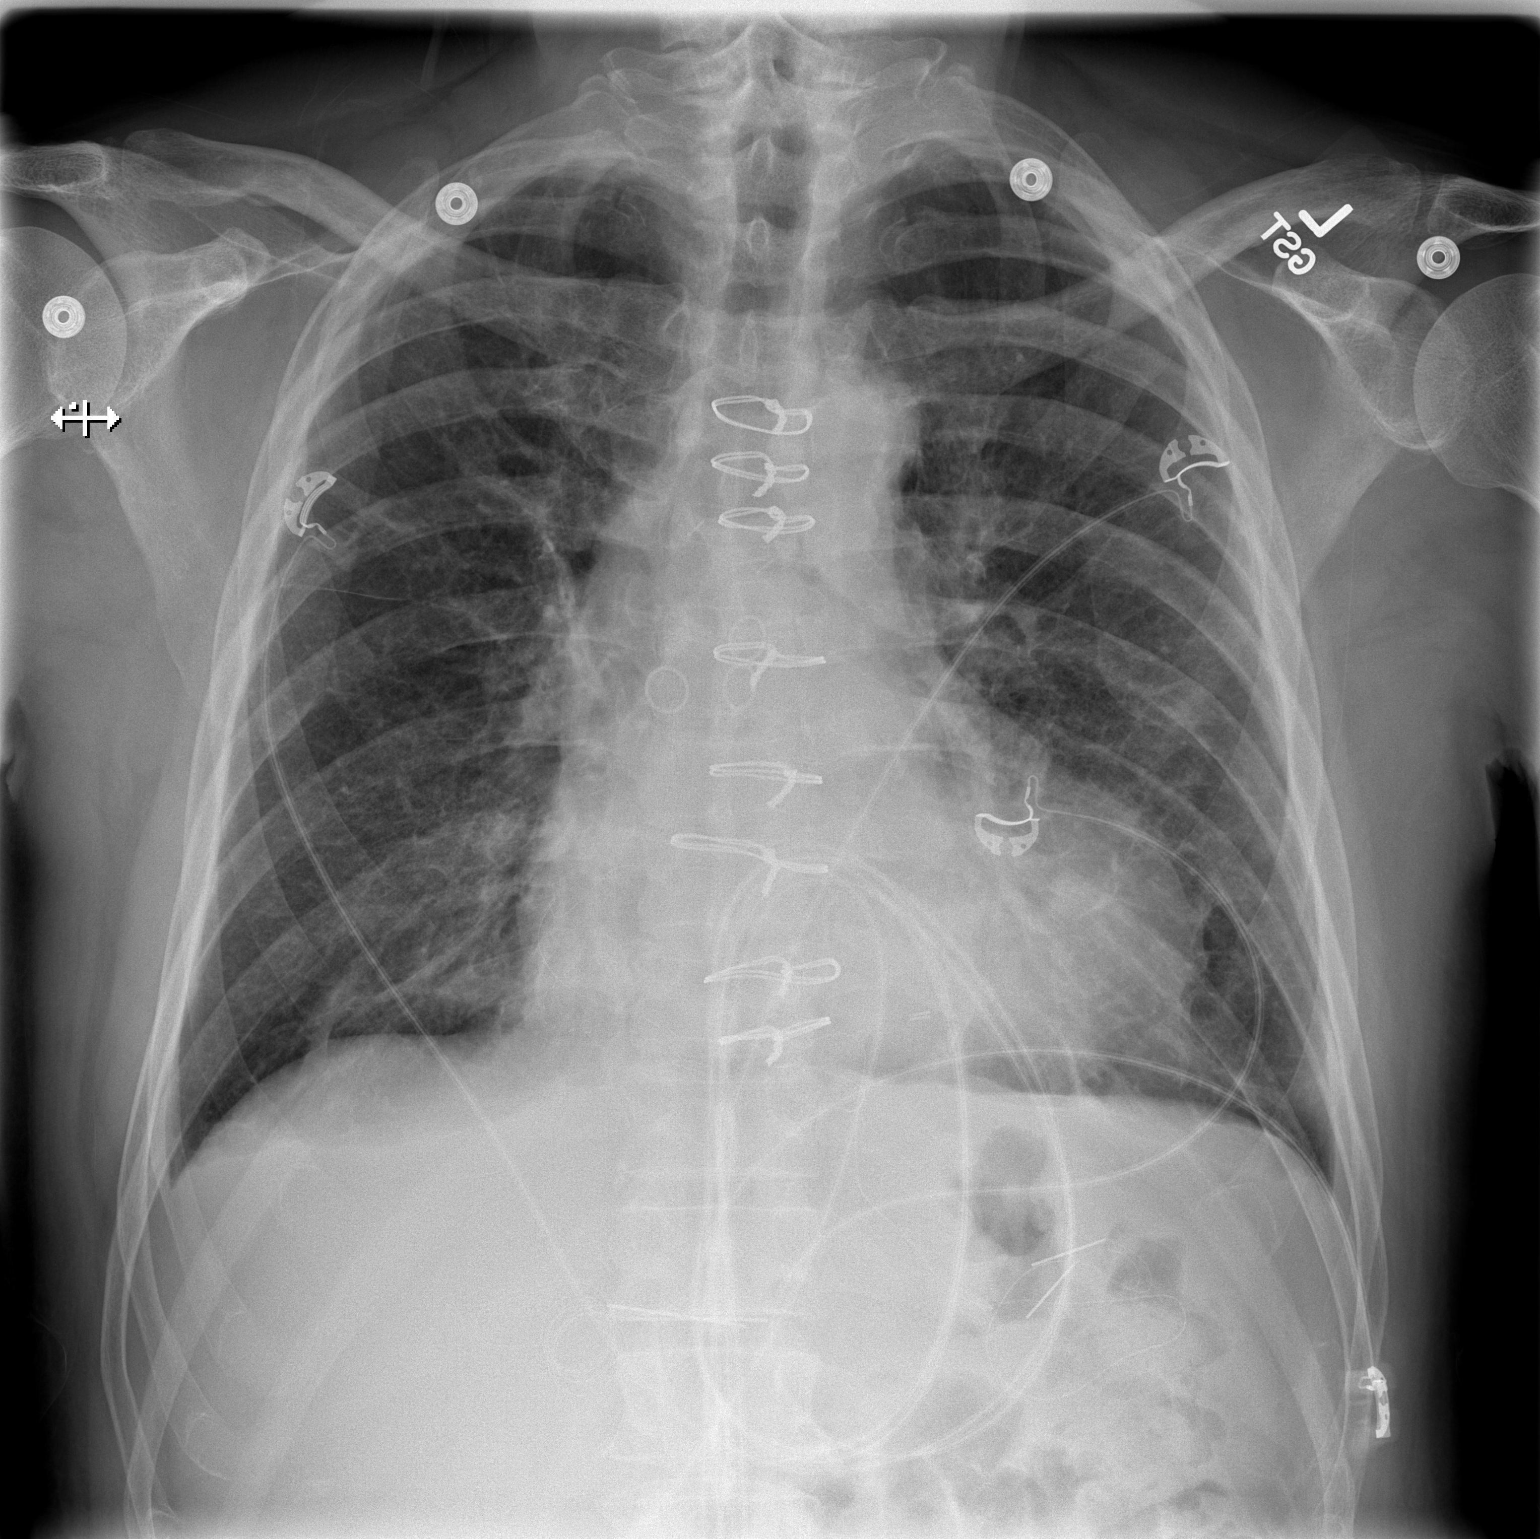

[w chest lat]
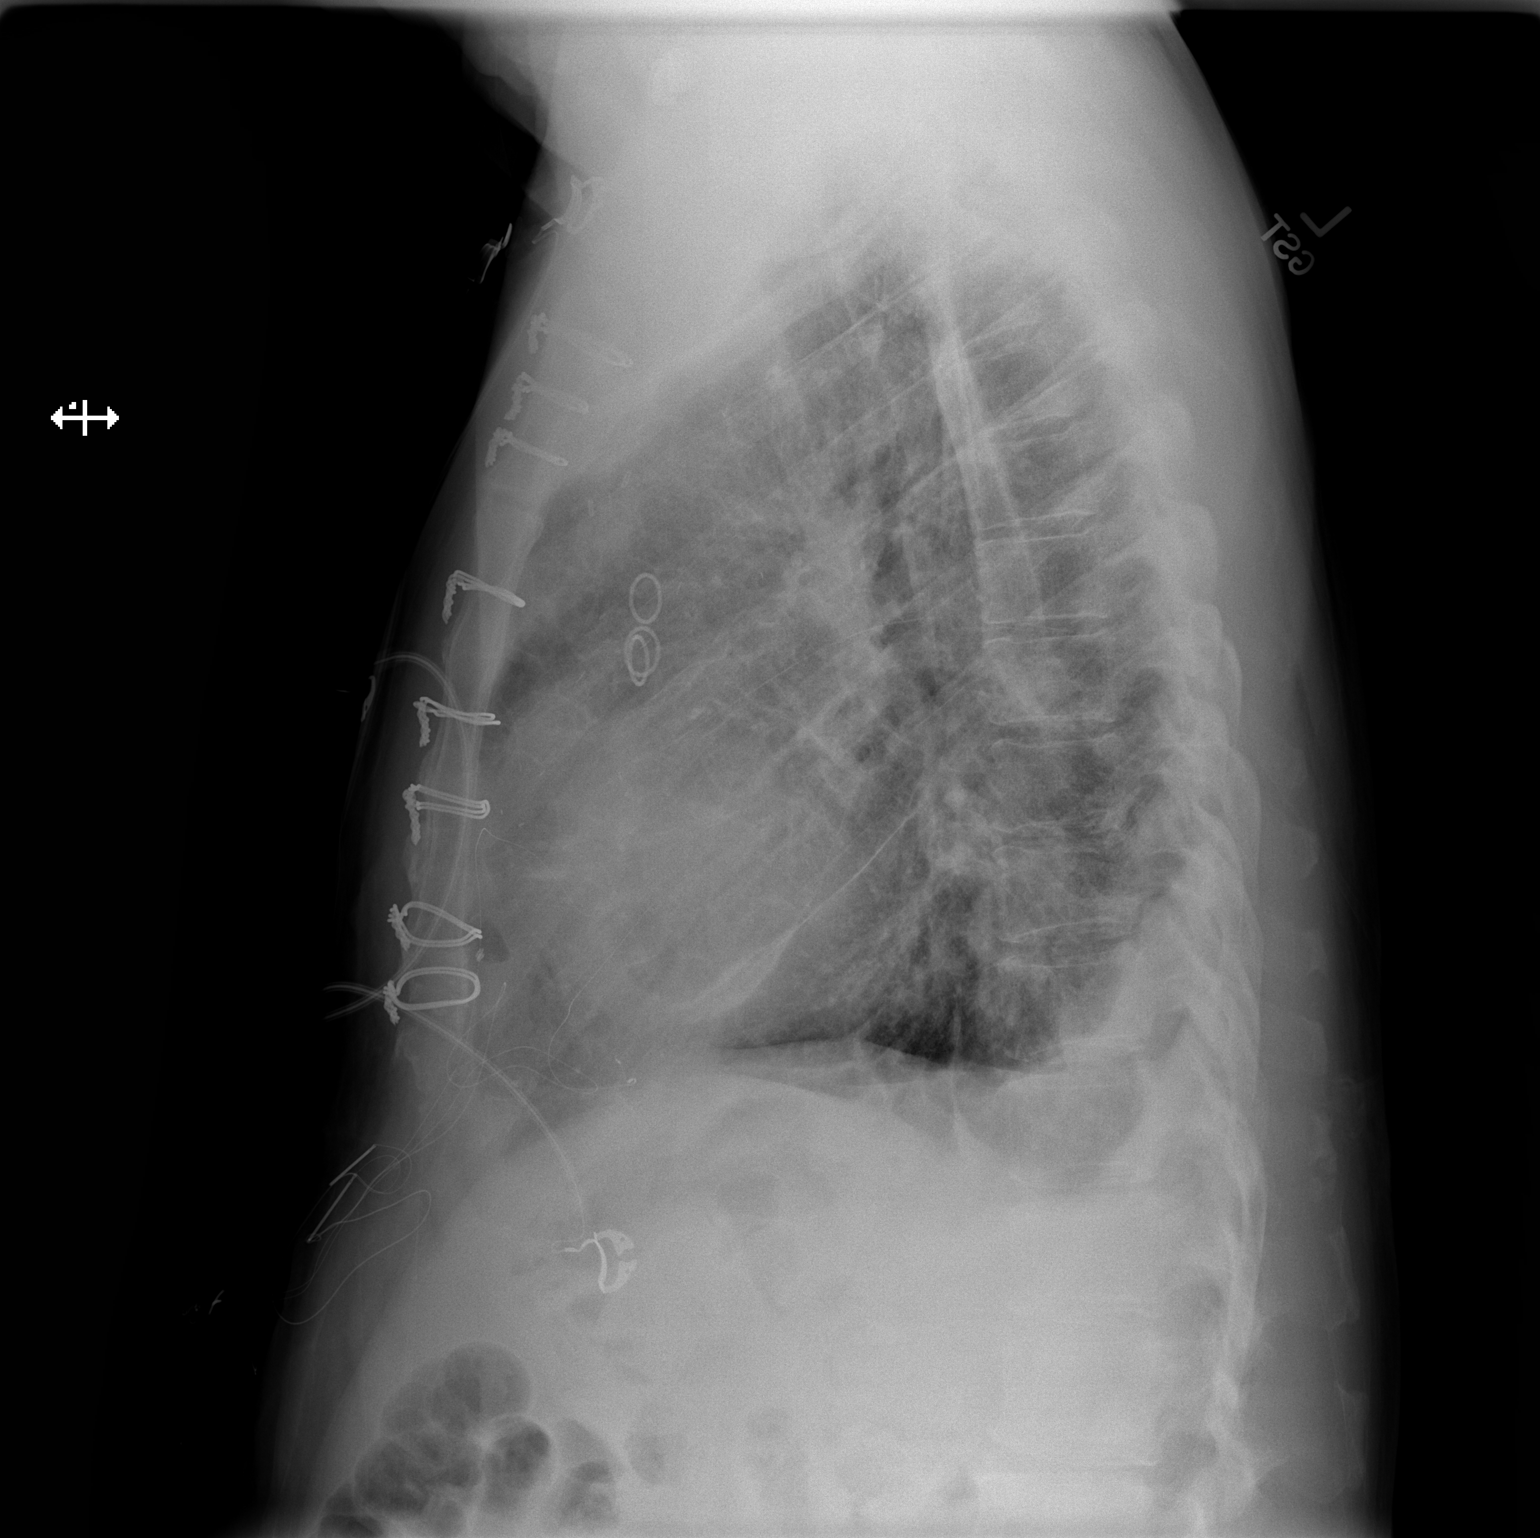

[2 of 2 positions shown; findings below may reference images not displayed]

FINDINGS: Cardiac shadow remains mildly enlarged. Postsurgical changes are
again seen. Interstitial changes are noted throughout both lungs. No
focal infiltrate is seen. Small bilateral pleural effusions are
noted posteriorly.
IMPRESSION: Small pleural effusions.  No focal infiltrate is noted.

## 2015-10-16 ENCOUNTER — Encounter: Payer: Self-pay | Admitting: Cardiovascular Disease

## 2015-10-16 ENCOUNTER — Ambulatory Visit (INDEPENDENT_AMBULATORY_CARE_PROVIDER_SITE_OTHER): Payer: BLUE CROSS/BLUE SHIELD | Admitting: Cardiovascular Disease

## 2015-10-16 ENCOUNTER — Encounter (INDEPENDENT_AMBULATORY_CARE_PROVIDER_SITE_OTHER): Payer: Self-pay

## 2015-10-16 VITALS — BP 122/60 | HR 52 | Ht 72.0 in | Wt 191.4 lb

## 2015-10-16 DIAGNOSIS — Z951 Presence of aortocoronary bypass graft: Secondary | ICD-10-CM

## 2015-10-16 DIAGNOSIS — I25810 Atherosclerosis of coronary artery bypass graft(s) without angina pectoris: Secondary | ICD-10-CM | POA: Diagnosis not present

## 2015-10-16 DIAGNOSIS — E785 Hyperlipidemia, unspecified: Secondary | ICD-10-CM

## 2015-10-16 DIAGNOSIS — I10 Essential (primary) hypertension: Secondary | ICD-10-CM

## 2015-10-16 DIAGNOSIS — I214 Non-ST elevation (NSTEMI) myocardial infarction: Secondary | ICD-10-CM

## 2015-10-16 NOTE — Assessment & Plan Note (Signed)
Previous LDL above goal, tolerating Crestor  No recent lab work available, he is requesting NMR lipoprofile  Lab order provided to him

## 2015-10-16 NOTE — Progress Notes (Signed)
Patient ID: [name] [name], male    DOB: Oct 15, 1950, 65 y.o.   MRN: [id number]   HPI Comments: HPI Comments: [name] [name] is a 64 year old gentleman with long history of smoking, borderline diabetes, seen in the hospital 03/24/2014 for chest pain, troponin of 12, non-STEMI, taken to the cardiac catheterization lab that showed occluded mid RCA, occluded mid circumflex, severe proximal LAD disease, also occluded diagonal vessel that was felt to be the culprit, ejection fraction 35%. He was transferred to [place] for  CABG, with procedure 03/28/2014 He presents today for follow-up of his coronary artery disease He stopped smoking around the time of his bypass surgery  In follow-up, he reports that he is doing well Active, plays golf, denies any symptoms of chest pain or shortness of breath on exertion Main complaint is bilateral shoulder discomfort when he tries to raise his arms above shoulder height, feels weaker on the left side Moving 12 bags of mulch yesterday Denies any problems with his medications denies feeling to fatigue, does not feel heart rate is too low Heart rate on last clinic visit was 55 bpm,  No recent lab work available, he is requesting an NMR lipoprofile  EKG on today's visit shows normal sinus rhythm with rate 52 bpm, nonspecific ST and T wave abnormality   Other past medical history Cardiac catheterization report details 100% occluded proximal diagonal vessel, occluded mid RCA, occluded mid circumflex, severe proximal LAD disease with collaterals from left to right suggesting CTO, left to left collaterals to OM 3 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  FFR was performed on the LAD and this was significant.   No Known Allergies  Current Outpatient Prescriptions on File Prior to Visit  Medication Sig Dispense Refill  . aspirin 81 MG EC tablet Take 1 tablet (81 mg total) by mouth daily. 30 tablet 0  . lisinopril (PRINIVIL,ZESTRIL) 2.5 MG tablet Take 1 tablet (2.5 mg total) by  mouth daily. 90 tablet 3  . metoprolol tartrate (LOPRESSOR) 25 MG tablet Take 0.5 tablets (12.5 mg total) by mouth 2 (two) times daily. 30 tablet 1  . Multiple Vitamins-Minerals (MULTIVITAMIN WITH MINERALS) tablet Take 1 tablet by mouth daily.    . rosuvastatin (CRESTOR) 40 MG tablet Take 1 tablet (40 mg total) by mouth daily. 30 tablet 11  . ticagrelor (BRILINTA) 60 MG TABS tablet Take 1 tablet (60 mg total) by mouth 2 (two) times daily. 180 tablet 3   No current facility-administered medications on file prior to visit.    Past Medical History  Diagnosis Date  . Acute non-Q wave ST elevation myocardial infarction (STEMI) 9/13-14/2015  . Ischemic cardiomyopathy     a. 03/2014 EF 35% by LV gram.  . Essential hypertension   . Hyperlipidemia with target LDL less than 70   . Tobacco abuse   . GERD (gastroesophageal reflux disease)   . 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 Atherosclerotic heart disease of native coronary artery with unstable angina pectoris ([place])     a. 03/2014 NSTEMI/Cath: LM nl, LAD 41m (FFR 0.76), D1 178m, LCX 149m, RCA 127m, EF 35%  . Peripheral vision loss     right eye     Past Surgical History  Procedure Laterality Date  . Adenoidectomy    . Coronary artery bypass graft N/A 03/28/2014    Procedure: CORONARY ARTERY BYPASS  GRAFTING (CABG) x  4 using left internal mammary artery and bilateral saphenous leg vein.;  Surgeon: Rexene Alberts, MD;  Location: [facility];  Service: Open Heart Surgery;  Laterality: N/A;  . Intraoperative transesophageal echocardiogram N/A 03/28/2014    Procedure: INTRAOPERATIVE TRANSESOPHAGEAL ECHOCARDIOGRAM;  Surgeon: Rexene Alberts, MD;  Location: [facility];  Service: Open Heart Surgery;  Laterality: N/A;  . Cardiac catheterization      a. 03/2014 NSTEMI/Cath: LM nl, LAD 53m (FFR 0.76), D1 151m, LCX 160m, RCA 157m, EF 35%    Social History  reports that he quit  smoking about 18 months ago. His smoking use included Cigarettes. He has a 40 pack-year smoking history. He has never used smokeless tobacco. He reports that he drinks alcohol. He reports that he does not use illicit drugs.  Family History Family history is unknown by patient.    Review of Systems  Constitutional: Negative.   Respiratory: Negative.   Cardiovascular: Negative.   Gastrointestinal: Negative.   Musculoskeletal: Negative.   Neurological: Negative.   Hematological: Negative.   Psychiatric/Behavioral: Negative.   All other systems reviewed and are negative.   BP 122/60 mmHg  Pulse 52  Ht 6' (1.829 m)  Wt 191 lb 6.4 oz (86.818 kg)  BMI 25.95 kg/m2  SpO2 98%  Physical Exam  Constitutional: He is oriented to person, place, and time. He appears well-developed and well-nourished.  HENT:  Head: Normocephalic.  Nose: Nose normal.  Mouth/Throat: Oropharynx is clear and moist.  Eyes: Conjunctivae are normal. Pupils are equal, round, and reactive to light.  Neck: Normal range of motion. Neck supple. No JVD present.  Cardiovascular: Regular rhythm, normal heart sounds and intact distal pulses.  Bradycardia present.  Exam reveals no gallop and no friction rub.   No murmur heard. Pulmonary/Chest: Effort normal and breath sounds normal. No respiratory distress. He has no wheezes. He has no rales. He exhibits no tenderness.  Abdominal: Soft. Bowel sounds are normal. He exhibits no distension. There is no tenderness.  Musculoskeletal: Normal range of motion. He exhibits no edema or tenderness.  Lymphadenopathy:    He has no cervical adenopathy.  Neurological: He is alert and oriented to person, place, and time. Coordination normal.  Skin: Skin is warm and dry. No rash noted. No erythema.  Psychiatric: He has a normal mood and affect. His behavior is normal. Judgment and thought content normal.

## 2015-10-16 NOTE — Assessment & Plan Note (Signed)
He has recovered from his bypass surgery, active Continues to have bilateral shoulder pain which he attributes to the surgery

## 2015-10-16 NOTE — Assessment & Plan Note (Signed)
Blood pressure is well controlled on today's visit. No changes made to the medications. 

## 2015-10-16 NOTE — Assessment & Plan Note (Signed)
Currently with no symptoms of angina. No further workup at this time. Continue current medication regimen. 

## 2015-10-16 NOTE — Patient Instructions (Addendum)
You are doing well. No medication changes were made.  We will order NMR lipoprofile  Please call us if you have new issues that need to be addressed before your next appt.  Your physician wants you to follow-up in: 6 months.  You will receive a reminder letter in the mail two months in advance. If you don't receive a letter, please call our office to schedule the follow-up appointment.

## 2015-12-25 DIAGNOSIS — Z951 Presence of aortocoronary bypass graft: Secondary | ICD-10-CM | POA: Diagnosis not present

## 2015-12-25 DIAGNOSIS — I252 Old myocardial infarction: Secondary | ICD-10-CM | POA: Diagnosis not present

## 2015-12-25 DIAGNOSIS — Y939 Activity, unspecified: Secondary | ICD-10-CM | POA: Insufficient documentation

## 2015-12-25 DIAGNOSIS — Y990 Civilian activity done for income or pay: Secondary | ICD-10-CM | POA: Diagnosis not present

## 2015-12-25 DIAGNOSIS — Y929 Unspecified place or not applicable: Secondary | ICD-10-CM | POA: Diagnosis not present

## 2015-12-25 DIAGNOSIS — S61012A Laceration without foreign body of left thumb without damage to nail, initial encounter: Secondary | ICD-10-CM | POA: Insufficient documentation

## 2015-12-25 DIAGNOSIS — E785 Hyperlipidemia, unspecified: Secondary | ICD-10-CM | POA: Diagnosis not present

## 2015-12-25 DIAGNOSIS — Z7982 Long term (current) use of aspirin: Secondary | ICD-10-CM | POA: Insufficient documentation

## 2015-12-25 DIAGNOSIS — I10 Essential (primary) hypertension: Secondary | ICD-10-CM | POA: Diagnosis not present

## 2015-12-25 DIAGNOSIS — Z87891 Personal history of nicotine dependence: Secondary | ICD-10-CM | POA: Diagnosis not present

## 2015-12-25 DIAGNOSIS — W268XXA Contact with other sharp object(s), not elsewhere classified, initial encounter: Secondary | ICD-10-CM | POA: Diagnosis not present

## 2015-12-25 NOTE — ED Notes (Signed)
Pt cut thumb tripping at work.  Pt does want to file workers comp at this time.

## 2015-12-26 ENCOUNTER — Emergency Department
Admission: EM | Admit: 2015-12-26 | Discharge: 2015-12-26 | Disposition: A | Discharge status: Home / Self Care | Payer: Worker's Compensation | Attending: Emergency Medicine | Admitting: Emergency Medicine

## 2015-12-26 DIAGNOSIS — S61219A Laceration without foreign body of unspecified finger without damage to nail, initial encounter: Secondary | ICD-10-CM

## 2015-12-26 MED ORDER — CEPHALEXIN 500 MG PO CAPS
500.0000 mg | ORAL_CAPSULE | Freq: Once | ORAL | Status: AC
Start: 2015-12-26 — End: 2015-12-26
  Administered 2015-12-26: 500 mg via ORAL
  Filled 2015-12-26: qty 1

## 2015-12-26 MED ORDER — BACITRACIN ZINC 500 UNIT/GM EX OINT
TOPICAL_OINTMENT | CUTANEOUS | Status: AC
  Administered 2015-12-26: 1
  Filled 2015-12-26: qty 0.9

## 2015-12-26 MED ORDER — CEPHALEXIN 500 MG PO CAPS: 500.0000 mg | ORAL_CAPSULE | Freq: Three times a day (TID) | ORAL | Status: DC

## 2015-12-26 MED ORDER — TETANUS-DIPHTH-ACELL PERTUSSIS 5-2.5-18.5 LF-MCG/0.5 IM SUSP
0.5000 mL | Freq: Once | INTRAMUSCULAR | Status: AC
  Administered 2015-12-26: 0.5 mL via INTRAMUSCULAR
  Filled 2015-12-26: qty 0.5

## 2015-12-26 MED ORDER — LIDOCAINE HCL (PF) 1 % IJ SOLN
INTRAMUSCULAR | Status: AC
  Administered 2015-12-26: 10 mL
  Filled 2015-12-26: qty 5

## 2015-12-26 NOTE — Discharge Instructions (Signed)
1. Take antibiotic as prescribed (Keflex 500 mg 3 times daily 7 days). 2. Your tetanus was updated and will be good for the next 10 years. 3. Keep wound clean and dry. 4. Return to the ED for worsening symptoms, increased redness/swelling, purulent discharge or other concerns.  Laceration Care, Adult A laceration is a cut that goes through all of the layers of the skin and into the tissue that is right under the skin. Some lacerations heal on their own. Others need to be closed with stitches (sutures), staples, skin adhesive strips, or skin glue. Proper laceration care minimizes the risk of infection and helps the laceration to heal better. HOW TO CARE FOR YOUR LACERATION If sutures or staples were used:  Keep the wound clean and dry.  If you were given a bandage (dressing), you should change it at least one time per day or as told by your health care provider. You should also change it if it becomes wet or dirty.  Keep the wound completely dry for the first 24 hours or as told by your health care provider. After that time, you may shower or bathe. However, make sure that the wound is not soaked in water until after the sutures or staples have been removed.  Clean the wound one time each day or as told by your health care provider:  Wash the wound with soap and water.  Rinse the wound with water to remove all soap.  Pat the wound dry with a clean towel. Do not rub the wound.  After cleaning the wound, apply a thin layer of antibiotic ointmentas told by your health care provider. This will help to prevent infection and keep the dressing from sticking to the wound.  Have the sutures or staples removed as told by your health care provider. If skin adhesive strips were used:  Keep the wound clean and dry.  If you were given a bandage (dressing), you should change it at least one time per day or as told by your health care provider. You should also change it if it becomes dirty or  wet.  Do not get the skin adhesive strips wet. You may shower or bathe, but be careful to keep the wound dry.  If the wound gets wet, pat it dry with a clean towel. Do not rub the wound.  Skin adhesive strips fall off on their own. You may trim the strips as the wound heals. Do not remove skin adhesive strips that are still stuck to the wound. They will fall off in time. If skin glue was used:  Try to keep the wound dry, but you may briefly wet it in the shower or bath. Do not soak the wound in water, such as by swimming.  After you have showered or bathed, gently pat the wound dry with a clean towel. Do not rub the wound.  Do not do any activities that will make you sweat heavily until the skin glue has fallen off on its own.  Do not apply liquid, cream, or ointment medicine to the wound while the skin glue is in place. Using those may loosen the film before the wound has healed.  If you were given a bandage (dressing), you should change it at least one time per day or as told by your health care provider. You should also change it if it becomes dirty or wet.  If a dressing is placed over the wound, be careful not to apply tape directly over  the skin glue. Doing that may cause the glue to be pulled off before the wound has healed.  Do not pick at the glue. The skin glue usually remains in place for 5-10 days, then it falls off of the skin. General Instructions  Take over-the-counter and prescription medicines only as told by your health care provider.  If you were prescribed an antibiotic medicine or ointment, take or apply it as told by your doctor. Do not stop using it even if your condition improves.  To help prevent scarring, make sure to cover your wound with sunscreen whenever you are outside after stitches are removed, after adhesive strips are removed, or when glue remains in place and the wound is healed. Make sure to wear a sunscreen of at least 30 SPF.  Do not scratch or  pick at the wound.  Keep all follow-up visits as told by your health care provider. This is important.  Check your wound every day for signs of infection. Watch for:  Redness, swelling, or pain.  Fluid, blood, or pus.  Raise (elevate) the injured area above the level of your heart while you are sitting or lying down, if possible. SEEK MEDICAL CARE IF:  You received a tetanus shot and you have swelling, severe pain, redness, or bleeding at the injection site.  You have a fever.  A wound that was closed breaks open.  You notice a bad smell coming from your wound or your dressing.  You notice something coming out of the wound, such as wood or glass.  Your pain is not controlled with medicine.  You have increased redness, swelling, or pain at the site of your wound.  You have fluid, blood, or pus coming from your wound.  You notice a change in the color of your skin near your wound.  You need to change the dressing frequently due to fluid, blood, or pus draining from the wound.  You develop a new rash.  You develop numbness around the wound. SEEK IMMEDIATE MEDICAL CARE IF:  You develop severe swelling around the wound.  Your pain suddenly increases and is severe.  You develop painful lumps near the wound or on skin that is anywhere on your body.  You have a red streak going away from your wound.  The wound is on your hand or foot and you cannot properly move a finger or toe.  The wound is on your hand or foot and you notice that your fingers or toes look pale or bluish.   This information is not intended to replace advice given to you by your health care provider. Make sure you discuss any questions you have with your health care provider.   Document Released: 06/27/2005 Document Revised: 11/11/2014 Document Reviewed: 06/23/2014 Elsevier Interactive Patient Education [employer].

## 2015-12-26 NOTE — ED Provider Notes (Signed)
[facility] Emergency Department Provider Note   ____________________________________________  Time seen: Approximately 1:32 AM  I have reviewed the triage vital signs and the nursing notes.   HISTORY  Chief Complaint Extremity Laceration    HPI [name] [name] is a 65 y.o. male who presents to the ED from work with a chief complaint of left thumb laceration. Patient tripped at work and cut thumb across a piece of wood. Denies foreign body sensation. Patient is right hand dominant. Tetanus is not up-to-date. Denies striking head or LOC. Denies other complaints other than mild pain to left hand at laceration site.   Past Medical History  Diagnosis Date  . Acute non-Q wave ST elevation myocardial infarction (STEMI) 9/13-14/2015  . Ischemic cardiomyopathy     a. 03/2014 EF 35% by LV gram.  . Essential hypertension   . Hyperlipidemia with target LDL less than 70   . Tobacco abuse   . GERD (gastroesophageal reflux disease)   . 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 Atherosclerotic heart disease of native coronary artery with unstable angina pectoris ([place])     a. 03/2014 NSTEMI/Cath: LM nl, LAD 92m (FFR 0.76), D1 183m, LCX 131m, RCA 173m, EF 35%  . Peripheral vision loss     right eye     Patient Active Problem List   Diagnosis Date Noted  . 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 NSTEMI (non-ST elevated myocardial infarction) ([place]) 03/26/2014  . Atherosclerotic heart disease of native coronary artery with unstable angina pectoris ([place])   . Ischemic cardiomyopathy   . Essential hypertension   . Hyperlipidemia with target LDL less than 70   . Tobacco abuse   . Personal history of colonic polyps 02/26/2013    Past Surgical History  Procedure Laterality Date  . Adenoidectomy    . Coronary artery bypass graft N/A 03/28/2014    Procedure: CORONARY ARTERY BYPASS GRAFTING (CABG) x   4 using left internal mammary artery and bilateral saphenous leg vein.;  Surgeon: Rexene Alberts, MD;  Location: [place];  Service: Open Heart Surgery;  Laterality: N/A;  . Intraoperative transesophageal echocardiogram N/A 03/28/2014    Procedure: INTRAOPERATIVE TRANSESOPHAGEAL ECHOCARDIOGRAM;  Surgeon: Rexene Alberts, MD;  Location: [place];  Service: Open Heart Surgery;  Laterality: N/A;  . Cardiac catheterization      a. 03/2014 NSTEMI/Cath: LM nl, LAD 82m (FFR 0.76), D1 174m, LCX 128m, RCA 159m, EF 35%    Current Outpatient Rx  Name  Route  Sig  Dispense  Refill  . aspirin 81 MG EC tablet   Oral   Take 1 tablet (81 mg total) by mouth daily.   30 tablet   0   . lisinopril (PRINIVIL,ZESTRIL) 2.5 MG tablet   Oral   Take 1 tablet (2.5 mg total) by mouth daily.   90 tablet   3     Changing to 90/3 as requested.   . metoprolol tartrate (LOPRESSOR) 25 MG tablet   Oral   Take 0.5 tablets (12.5 mg total) by mouth 2 (two) times daily.   30 tablet   1   . Multiple Vitamins-Minerals (MULTIVITAMIN WITH MINERALS) tablet   Oral   Take 1 tablet by mouth daily.         . rosuvastatin (CRESTOR) 40 MG tablet   Oral   Take  1 tablet (40 mg total) by mouth daily.   30 tablet   11   . ticagrelor (BRILINTA) 60 MG TABS tablet   Oral   Take 1 tablet (60 mg total) by mouth 2 (two) times daily.   180 tablet   3     Allergies Morphine and related  Family History  Problem Relation Age of Onset  . Family history unknown: Yes    Social History Social History  Substance Use Topics  . Smoking status: Former Smoker -- 1.00 packs/day for 40 years    Types: Cigarettes    Quit date: 03/24/2014  . Smokeless tobacco: Never Used  . Alcohol Use: Yes     Comment: 4/week    Review of Systems  Constitutional: No fever/chills. Eyes: No visual changes. ENT: No sore throat. Cardiovascular: Denies chest pain. Respiratory: Denies shortness of breath. Gastrointestinal: No abdominal pain.  No  nausea, no vomiting.  No diarrhea.  No constipation. Genitourinary: Negative for dysuria. Musculoskeletal: Positive for left hand laceration. Negative for back pain. Skin: Negative for rash. Neurological: Negative for headaches, focal weakness or numbness.  10-point ROS otherwise negative.  ____________________________________________   PHYSICAL EXAM:  VITAL SIGNS: ED Triage Vitals  Enc Vitals Group     BP 12/25/15 2244 154/73 mmHg     Pulse Rate 12/25/15 2244 66     Resp 12/25/15 2244 18     Temp 12/25/15 2244 98.6 F (37 C)     Temp src --      SpO2 12/25/15 2244 98 %     Weight 12/25/15 2244 185 lb (83.915 kg)     Height 12/25/15 2244 6' (1.829 m)     Head Cir --      Peak Flow --      Pain Score --      Pain Loc --      Pain Edu? --      Excl. in [place]? --     Constitutional: Alert and oriented. Well appearing and in no acute distress. Eyes: Conjunctivae are normal. PERRL. EOMI. Head: Atraumatic. Nose: No congestion/rhinnorhea. Mouth/Throat: Mucous membranes are moist.  Oropharynx non-erythematous. Neck: No stridor.   Cardiovascular: Normal rate, regular rhythm. Grossly normal heart sounds.  Good peripheral circulation. Respiratory: Normal respiratory effort.  No retractions. Lungs CTAB. Gastrointestinal: Soft and nontender. No distention. No abdominal bruits. No CVA tenderness. Musculoskeletal: Left hand: Approximately 2.5cm curved laceration to left base of thumb. No active bleeding. Full range of motion. 2+ radial pulses. Brisk, less than 5 second capillary refill. Neurologic:  Normal speech and language. No gross focal neurologic deficits are appreciated. No gait instability. Skin:  Skin is warm, dry and intact. No rash noted. Psychiatric: Mood and affect are normal. Speech and behavior are normal.  ____________________________________________   LABS (all labs ordered are listed, but only abnormal results are displayed)  Labs Reviewed - No data to  display ____________________________________________  EKG  None ____________________________________________  RADIOLOGY  None ____________________________________________   PROCEDURES  Procedure(s) performed:   LACERATION REPAIR Performed by: PA Student Leory Plowman Authorized by: Paulette Blanch Consent: Verbal consent obtained. Risks and benefits: risks, benefits and alternatives were discussed Consent given by: patient Patient identity confirmed: provided demographic data Prepped and Draped in normal sterile fashion Wound explored - no tendon injury visualized  Laceration Location: Base of left thumb  Laceration Length: 2cm  No Foreign Bodies seen or palpated  Anesthesia: local infiltration  Local anesthetic: lidocaine 1% without epinephrine  Anesthetic total: 5  ml  Irrigation method: syringe Amount of cleaning: standard  Skin closure: 4-0 Nylon  Number of sutures: 5  Technique: Standard sterile  Patient tolerance: Patient tolerated the procedure well with no immediate complications.  Critical Care performed: No  ____________________________________________   INITIAL IMPRESSION / ASSESSMENT AND PLAN / ED COURSE  Pertinent labs & imaging results that were available during my care of the patient were reviewed by me and considered in my medical decision making (see chart for details).  65 year old male who presents with left hand laceration. Tetanus updated. Tolerated sutures well. Will place on keflex and patient will follow up with orthopedics next week. Strict return precautions given. Patient verbalizes understanding and agrees with plan of care. ____________________________________________   FINAL CLINICAL IMPRESSION(S) / ED DIAGNOSES  Final diagnoses:  Finger laceration, initial encounter      NEW MEDICATIONS STARTED DURING THIS VISIT:  New Prescriptions   No medications on file     Note:  This document was prepared using Dragon voice  recognition software and may include unintentional dictation errors.    Paulette Blanch, MD 12/26/15 [phone]

## 2016-01-01 ENCOUNTER — Encounter: Payer: Self-pay | Admitting: Medical Oncology

## 2016-01-01 ENCOUNTER — Emergency Department
Admission: EM | Admit: 2016-01-01 | Discharge: 2016-01-01 | Disposition: A | Discharge status: Home / Self Care | Payer: Worker's Compensation | Attending: Emergency Medicine | Admitting: Emergency Medicine

## 2016-01-01 DIAGNOSIS — Z4802 Encounter for removal of sutures: Secondary | ICD-10-CM | POA: Insufficient documentation

## 2016-01-01 DIAGNOSIS — I25110 Atherosclerotic heart disease of native coronary artery with unstable angina pectoris: Secondary | ICD-10-CM | POA: Diagnosis not present

## 2016-01-01 DIAGNOSIS — Z87891 Personal history of nicotine dependence: Secondary | ICD-10-CM | POA: Diagnosis not present

## 2016-01-01 DIAGNOSIS — Z79899 Other long term (current) drug therapy: Secondary | ICD-10-CM | POA: Insufficient documentation

## 2016-01-01 DIAGNOSIS — Z7982 Long term (current) use of aspirin: Secondary | ICD-10-CM | POA: Diagnosis not present

## 2016-01-01 DIAGNOSIS — I252 Old myocardial infarction: Secondary | ICD-10-CM | POA: Diagnosis not present

## 2016-01-01 DIAGNOSIS — I255 Ischemic cardiomyopathy: Secondary | ICD-10-CM | POA: Diagnosis not present

## 2016-01-01 DIAGNOSIS — I10 Essential (primary) hypertension: Secondary | ICD-10-CM | POA: Insufficient documentation

## 2016-01-01 DIAGNOSIS — Z951 Presence of aortocoronary bypass graft: Secondary | ICD-10-CM | POA: Diagnosis not present

## 2016-01-01 DIAGNOSIS — E785 Hyperlipidemia, unspecified: Secondary | ICD-10-CM | POA: Diagnosis not present

## 2016-01-01 NOTE — ED Provider Notes (Signed)
[facility] Emergency Department Provider Note   ____________________________________________  Time seen: Approximately 2:21 PM  I have reviewed the triage vital signs and the nursing notes.   HISTORY  Chief Complaint Suture / Staple Removal    HPI [name] [name] is a 65 y.o. male who arrives to the emergency department for suture removal. Patient was seen in ED last Friday night s/p laceration to the base of the L thumb after tripping over a wooden palate. He denies any complications with wound such as edema, erythema, warmth, discharge, pain, numbness, tingling, and loss of ROM. Does report that he had two stiches burst while he was driving several days ago but her states there was no bleeding.    Past Medical History  Diagnosis Date  . Acute non-Q wave ST elevation myocardial infarction (STEMI) 9/13-14/2015  . Ischemic cardiomyopathy     a. 03/2014 EF 35% by LV gram.  . Essential hypertension   . Hyperlipidemia with target LDL less than 70   . Tobacco abuse   . GERD (gastroesophageal reflux disease)   . 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 Atherosclerotic heart disease of native coronary artery with unstable angina pectoris ([place])     a. 03/2014 NSTEMI/Cath: LM nl, LAD 73m (FFR 0.76), D1 166m, LCX 161m, RCA 19m, EF 35%  . Peripheral vision loss     right eye     Patient Active Problem List   Diagnosis Date Noted  . 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 NSTEMI (non-ST elevated myocardial infarction) ([place]) 03/26/2014  . Atherosclerotic heart disease of native coronary artery with unstable angina pectoris ([place])   . Ischemic cardiomyopathy   . Essential hypertension   . Hyperlipidemia with target LDL less than 70   . Tobacco abuse   . Personal history of colonic polyps 02/26/2013    Past Surgical History  Procedure Laterality Date  . Adenoidectomy    . Coronary  artery bypass graft N/A 03/28/2014    Procedure: CORONARY ARTERY BYPASS GRAFTING (CABG) x  4 using left internal mammary artery and bilateral saphenous leg vein.;  Surgeon: Rexene Alberts, MD;  Location: [place];  Service: Open Heart Surgery;  Laterality: N/A;  . Intraoperative transesophageal echocardiogram N/A 03/28/2014    Procedure: INTRAOPERATIVE TRANSESOPHAGEAL ECHOCARDIOGRAM;  Surgeon: Rexene Alberts, MD;  Location: [place];  Service: Open Heart Surgery;  Laterality: N/A;  . Cardiac catheterization      a. 03/2014 NSTEMI/Cath: LM nl, LAD 54m (FFR 0.76), D1 115m, LCX 137m, RCA 146m, EF 35%    Current Outpatient Rx  Name  Route  Sig  Dispense  Refill  . aspirin 81 MG EC tablet   Oral   Take 1 tablet (81 mg total) by mouth daily.   30 tablet   0   . cephALEXin (KEFLEX) 500 MG capsule   Oral   Take 1 capsule (500 mg total) by mouth 3 (three) times daily.   21 capsule   0   . lisinopril (PRINIVIL,ZESTRIL) 2.5 MG tablet   Oral   Take 1 tablet (2.5 mg total) by mouth daily.   90 tablet   3     Changing to 90/3 as requested.   . metoprolol tartrate (LOPRESSOR) 25 MG tablet   Oral   Take 0.5 tablets (12.5 mg total) by mouth  2 (two) times daily.   30 tablet   1   . Multiple Vitamins-Minerals (MULTIVITAMIN WITH MINERALS) tablet   Oral   Take 1 tablet by mouth daily.         . rosuvastatin (CRESTOR) 40 MG tablet   Oral   Take 1 tablet (40 mg total) by mouth daily.   30 tablet   11   . ticagrelor (BRILINTA) 60 MG TABS tablet   Oral   Take 1 tablet (60 mg total) by mouth 2 (two) times daily.   180 tablet   3     Allergies Morphine and related  Family History  Problem Relation Age of Onset  . Family history unknown: Yes    Social History Social History  Substance Use Topics  . Smoking status: Former Smoker -- 1.00 packs/day for 40 years    Types: Cigarettes    Quit date: 03/24/2014  . Smokeless tobacco: Never Used  . Alcohol Use: Yes     Comment: 4/week     Review of Systems Constitutional: No fever/chills Cardiovascular: Denies chest pain. Respiratory: Denies shortness of breath. Gastrointestinal: No abdominal pain.  No nausea, no vomiting.  No diarrhea.  No constipation. Skin: Positive for healing laceration with stiches, Negative for edema, erythema, or drainage Neurological: Negative for headaches, focal weakness or numbness.  10-point ROS otherwise negative.  ____________________________________________   PHYSICAL EXAM:  VITAL SIGNS: ED Triage Vitals  Enc Vitals Group     BP 01/01/16 1403 148/77 mmHg     Pulse Rate 01/01/16 1403 65     Resp 01/01/16 1403 18     Temp 01/01/16 1403 98.5 F (36.9 C)     Temp Source 01/01/16 1403 Oral     SpO2 01/01/16 1403 97 %     Weight 01/01/16 1409 185 lb (83.915 kg)     Height 01/01/16 1409 6' (1.829 m)     Head Cir --      Peak Flow --      Pain Score 01/01/16 1402 0     Pain Loc --      Pain Edu? --      Excl. in [place]? --     Constitutional: Alert and oriented. Well appearing and in no acute distress.. Musculoskeletal: ROM of thumbs full and in tact bilaterally. Strength 5/5 Skin:  Skin is warm and dry. Healing 2.5 cm curved laceration with 5 sutures. Two sutures towards radial aspect of palm had burst and 0.5 cm of laceration open.  Psychiatric: Mood and affect are normal. Speech and behavior are normal.  ____________________________________________   LABS (all labs ordered are listed, but only abnormal results are displayed)  Labs Reviewed - No data to display ____________________________________________  EKG  none ____________________________________________  RADIOLOGY  none ____________________________________________   PROCEDURES  Procedure(s) performed: SUTURE REMOVAL Performed by: Arlyss Repress  Consent: Verbal consent obtained. Patient identity confirmed: provided demographic data Time out: Immediately prior to procedure a "time out" was called to  verify the correct patient, procedure, equipment, support staff and site/side marked as required.  Location details: Base of left thumb  Wound Appearance: clean, without erythema, edema or discharge.  Sutures/Staples Removed: 5 sutures were removed. Two of the sutures had burst with approximately 0.5cm of the laceration open.  Steri Strips: Four steri strips were applied to site and education regarding care was given to patient.  Facility: sutures placed in this facility Patient tolerance: Patient tolerated the procedure well with no immediate complications.  Critical Care performed: No  ____________________________________________   INITIAL IMPRESSION / ASSESSMENT AND PLAN / ED COURSE  Pertinent labs & imaging results that were available during my care of the patient were reviewed by me and considered in my medical decision making (see chart for details).  Suture removal: initial visit: Patient discharged to home with instructions regarding care of wound s/p suture removal. He was instructed to keep the area dry for at least 24 hours. Follow-up with PCP as necessary for pain, or any infectious changes. If no PCP follow-up in the ED.   ____________________________________________   FINAL CLINICAL IMPRESSION(S) / ED DIAGNOSES  Final diagnoses:  Visit for suture removal      NEW MEDICATIONS STARTED DURING THIS VISIT:  Discharge Medication List as of 01/01/2016  2:31 PM       Note:  This document was prepared using Dragon voice recognition software and may include unintentional dictation errors.     Arlyss Repress, PA-C 01/01/16 [place], MD 01/02/16 [phone]

## 2016-01-01 NOTE — ED Notes (Signed)
Here to have stitches removed from left thumb.

## 2016-01-01 NOTE — Discharge Instructions (Signed)

## 2016-01-01 NOTE — ED Notes (Signed)
Here for suture removal to left hand  Sutures intact   No drainage or redness noted

## 2016-04-15 ENCOUNTER — Encounter: Payer: Self-pay | Admitting: Cardiovascular Disease

## 2016-04-15 ENCOUNTER — Ambulatory Visit (INDEPENDENT_AMBULATORY_CARE_PROVIDER_SITE_OTHER): Payer: BLUE CROSS/BLUE SHIELD | Admitting: Cardiovascular Disease

## 2016-04-15 VITALS — BP 122/70 | HR 50 | Ht 72.0 in | Wt 188.5 lb

## 2016-04-15 DIAGNOSIS — I255 Ischemic cardiomyopathy: Secondary | ICD-10-CM | POA: Diagnosis not present

## 2016-04-15 DIAGNOSIS — I10 Essential (primary) hypertension: Secondary | ICD-10-CM

## 2016-04-15 DIAGNOSIS — I25110 Atherosclerotic heart disease of native coronary artery with unstable angina pectoris: Secondary | ICD-10-CM

## 2016-04-15 DIAGNOSIS — E785 Hyperlipidemia, unspecified: Secondary | ICD-10-CM

## 2016-04-15 DIAGNOSIS — Z951 Presence of aortocoronary bypass graft: Secondary | ICD-10-CM | POA: Diagnosis not present

## 2016-04-15 NOTE — Patient Instructions (Signed)
Medication Instructions:   Please decrease the metoprolol down to 1/2 pill once a day  Labwork:  No new labs needed  Testing/Procedures:  No further testing at this time   Follow-Up: It was a pleasure seeing you in the office today. Please call us if you have new issues that need to be addressed before your next appt.  [phone]  Your physician wants you to follow-up in: 6 months.  You will receive a reminder letter in the mail two months in advance. If you don't receive a letter, please call our office to schedule the follow-up appointment.  If you need a refill on your cardiac medications before your next appointment, please call your pharmacy.

## 2016-04-15 NOTE — Progress Notes (Signed)
Cardiology Office Note  Date:  04/15/2016   ID:  [name] [name], DOB 1951-02-05, MRN [id number]  PCP:  Ida Rogue, MD   Chief Complaint  Patient presents with  . other    6 month follow up. "doing well."     HPI:  HPI Comments: Mr. Drakos is a 65 year old gentleman with long history of smoking,  seen in the hospital 03/24/2014 for chest pain, troponin of 12, non-STEMI, taken to the cardiac catheterization lab that showed occluded mid RCA, occluded mid circumflex, severe proximal LAD disease, also occluded diagonal vessel that was felt to be the culprit, ejection fraction 35%. He was transferred to [place] for  CABG, with procedure 03/28/2014 He presents today for follow-up of his coronary artery disease He stopped smoking around the time of his bypass surgery  In follow-up today, he reports that he is doing well, does have some bilateral shoulder pain. Walks significant distances at work, works in a [employer] Denies any chest pain concerning for angina Plays golf Typically has low heart rate, asymptomatic  Lab work reviewed with him Total chol 140, ldl 75 Low small part LDL Fasting glucose 87  EKG on today's visit shows normal sinus rhythm with rate 49 bpm, nonspecific ST and T wave abnormality   Other past medical history Cardiac catheterization report details 100% occluded proximal diagonal vessel, occluded mid RCA, occluded mid circumflex, severe proximal LAD disease with collaterals from left to right suggesting CTO, left to left collaterals to OM 3 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  FFR was performed on the LAD and this was significant.  PMH:   has a past medical history of Acute non-Q wave ST elevation myocardial infarction (STEMI) (9/13-14/2015); Atherosclerotic heart disease of native coronary artery with unstable angina pectoris ([place]); Essential hypertension; GERD (gastroesophageal reflux disease); Hyperlipidemia with target LDL less than 70; Ischemic cardiomyopathy;  Peripheral vision loss; S/P CABG x 4 (03/28/2014); and Tobacco abuse.  PSH:    Past Surgical History:  Procedure Laterality Date  . ADENOIDECTOMY    . CARDIAC CATHETERIZATION     a. 03/2014 NSTEMI/Cath: LM nl, LAD 86m (FFR 0.76), D1 139m, LCX 182m, RCA 120m, EF 35%  . CORONARY ARTERY BYPASS GRAFT N/A 03/28/2014   Procedure: CORONARY ARTERY BYPASS GRAFTING (CABG) x  4 using left internal mammary artery and bilateral saphenous leg vein.;  Surgeon: Rexene Alberts, MD;  Location: [place];  Service: Open Heart Surgery;  Laterality: N/A;  . INTRAOPERATIVE TRANSESOPHAGEAL ECHOCARDIOGRAM N/A 03/28/2014   Procedure: INTRAOPERATIVE TRANSESOPHAGEAL ECHOCARDIOGRAM;  Surgeon: Rexene Alberts, MD;  Location: [place];  Service: Open Heart Surgery;  Laterality: N/A;    Current Outpatient Prescriptions  Medication Sig Dispense Refill  . aspirin 81 MG EC tablet Take 1 tablet (81 mg total) by mouth daily. 30 tablet 0  . cephALEXin (KEFLEX) 500 MG capsule Take 1 capsule (500 mg total) by mouth 3 (three) times daily. 21 capsule 0  . cholecalciferol (VITAMIN D) 1000 units tablet Take 1,000 Units by mouth daily.    Marland Kitchen lisinopril (PRINIVIL,ZESTRIL) 2.5 MG tablet Take 1 tablet (2.5 mg total) by mouth daily. 90 tablet 3  . metoprolol tartrate (LOPRESSOR) 25 MG tablet Take 0.5 tablets (12.5 mg total) by mouth 2 (two) times daily. 30 tablet 1  . rosuvastatin (CRESTOR) 40 MG tablet Take 1 tablet (40 mg total) by mouth daily. 30 tablet 11  . ticagrelor (BRILINTA) 60 MG TABS tablet Take 1 tablet (60 mg total) by mouth 2 (two) times  daily. 180 tablet 3   No current facility-administered medications for this visit.      Allergies:   Morphine and related   Social History:  The patient  reports that he quit smoking about 2 years ago. His smoking use included Cigarettes. He has a 40.00 pack-year smoking history. He has never used smokeless tobacco. He reports that he drinks alcohol. He reports that he does not use drugs.    Family History:   Family history is unknown by patient.    Review of Systems: Review of Systems  Constitutional: Negative.   Respiratory: Negative.   Cardiovascular: Negative.   Gastrointestinal: Negative.   Musculoskeletal: Negative.   Neurological: Negative.   Psychiatric/Behavioral: Negative.   All other systems reviewed and are negative.    PHYSICAL EXAM: VS:  BP 122/70 (BP Location: Left Arm, Patient Position: Sitting, Cuff Size: Normal)   Pulse (!) 50   Ht 6' (1.829 m)   Wt 188 lb 8 oz (85.5 kg)   BMI 25.57 kg/m  , BMI Body mass index is 25.57 kg/m. GEN: Well nourished, well developed, in no acute distress  HEENT: normal  Neck: no JVD, carotid bruits, or masses Cardiac: RRR; no murmurs, rubs, or gallops,no edema  Respiratory:  Moderately decreased breath sounds throughout, normal work of breathing GI: soft, nontender, nondistended, + BS MS: no deformity or atrophy  Skin: warm and dry, no rash Neuro:  Strength and sensation are intact Psych: euthymic mood, full affect    Recent Labs: No results found for requested labs within last 8760 hours.    Lipid Panel Lab Results  Component Value Date   CHOL 124 03/27/2014   HDL 44 03/27/2014   LDLCALC 66 03/27/2014   TRIG 72 03/27/2014      Wt Readings from Last 3 Encounters:  04/15/16 188 lb 8 oz (85.5 kg)  01/01/16 185 lb (83.9 kg)  12/25/15 185 lb (83.9 kg)       ASSESSMENT AND PLAN:  Essential hypertension - Plan: EKG 12-Lead Blood pressure is well controlled on today's visit. No changes made to the medications. Suggested he could decrease the metoprolol down to one half pill once a day if he is symptomatic from bradycardia  Ischemic cardiomyopathy - Plan: EKG 12-Lead Currently with no symptoms of angina. No further workup at this time. Continue current medication regimen.  S/P CABG x 4 Currently with no symptoms of angina, no further testing needed Atherosclerosis of native coronary artery of  native heart with unstable angina pectoris ([place])  Hyperlipidemia with target LDL less than 70 Cholesterol is at goal on the current lipid regimen. No changes to the medications were made.   Total encounter time more than 15 minutes  Greater than 50% was spent in counseling and coordination of care with the patient   Disposition:   F/U  6 months   Orders Placed This Encounter  Procedures  . EKG 12-Lead     Signed, Esmond Plants, M.D., Ph.D. 04/15/2016  [place], [place]

## 2016-04-24 ENCOUNTER — Other Ambulatory Visit: Payer: Self-pay | Admitting: Cardiovascular Disease

## 2016-04-26 ENCOUNTER — Other Ambulatory Visit: Payer: Self-pay | Admitting: Cardiovascular Disease

## 2016-10-13 NOTE — Progress Notes (Signed)
Cardiology Office Note  Date:  10/14/2016   ID:  [name] [name], DOB 1950-08-26, MRN [id number]  PCP:  Albina Billet, MD   Chief Complaint  Patient presents with  . other    6 month follow up. Patient States he is doing well. Meds reviewed verbally with patient.     HPI:  HPI Comments: [name] [name] is a 66 year old gentleman with long history of smoking,    hospital 03/24/2014 for chest pain, troponin of 12, non-STEMI,  catheterization showed occluded mid RCA, occluded mid circumflex, severe proximal LAD disease,occluded diagonal vessel   ejection fraction 35%. CABG,  03/28/2014 He presents today for follow-up of his coronary artery disease He stopped smoking around the time of his bypass surgery  In follow-up today he reports that he is doing relatively well Previous history of bilateral shoulder pain, Limited range of motion, arthritis Walks significant distances at work, works in [employer], retiring aug 2018 Denies any chest pain concerning for angina Plays golf Typically has low heart rate, asymptomatic Decrease metoprolol on prior clinic visit  Lab work reviewed with him Total chol 156, ldl 86 Fasting glucose 87 HBa1C 5.7  Taking metoprolol 12.5 daily  EKG on today's visit shows normal sinus rhythm with rate 53 bpm, nonspecific ST and T wave abnormality   Other past medical history Cardiac catheterization report details 100% occluded proximal diagonal vessel, occluded mid RCA, occluded mid circumflex, severe proximal LAD disease with collaterals from left to right suggesting CTO, left to left collaterals to OM 3 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  FFR was performed on the LAD and this was significant.  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  PSH:    Past Surgical History:  Procedure Laterality Date  . ADENOIDECTOMY    . CARDIAC CATHETERIZATION     a. 03/2014 NSTEMI/Cath: LM nl, LAD 82m (FFR 0.76), D1 171m, LCX 1104m, RCA 144m, EF 35%  . CORONARY ARTERY BYPASS GRAFT N/A 03/28/2014   Procedure: CORONARY ARTERY BYPASS GRAFTING (CABG) x  4 using left internal mammary artery and bilateral saphenous leg vein.;  Surgeon: Rexene Alberts, MD;  Location: [place];  Service: Open Heart Surgery;  Laterality: N/A;  . INTRAOPERATIVE TRANSESOPHAGEAL ECHOCARDIOGRAM N/A 03/28/2014   Procedure: INTRAOPERATIVE TRANSESOPHAGEAL ECHOCARDIOGRAM;  Surgeon: Rexene Alberts, MD;  Location: [place];  Service: Open Heart Surgery;  Laterality: N/A;    Current Outpatient Prescriptions  Medication Sig Dispense Refill  . aspirin 81 MG EC tablet Take 1 tablet (81 mg total) by mouth daily. 30 tablet 0  . BRILINTA 60 MG TABS tablet TAKE 1 TABLET (60 MG TOTAL) BY MOUTH 2 (TWO) TIMES DAILY. 180 tablet 3  . cholecalciferol (VITAMIN D) 1000 units tablet Take 1,000 Units by mouth daily.    Marland Kitchen lisinopril (PRINIVIL,ZESTRIL) 2.5 MG tablet TAKE 1 TABLET (2.5 MG TOTAL) BY MOUTH DAILY. 90 tablet 3  . metoprolol tartrate (LOPRESSOR) 25 MG tablet Take 0.5 tablets (12.5 mg total) by mouth 2 (two) times daily. 30 tablet 1  . Multiple Vitamin (MULTI-VITAMINS) TABS Take by mouth.    . pravastatin (PRAVACHOL) 40 MG tablet Take 40 mg by mouth at bedtime.    . cephALEXin (KEFLEX) 500 MG  capsule Take 1 capsule (500 mg total) by mouth 3 (three) times daily. (Patient not taking: Reported on 10/14/2016) 21 capsule 0  . rosuvastatin (CRESTOR) 40 MG tablet Take 1 tablet (40 mg total) by mouth daily. (Patient not taking: Reported on 10/14/2016) 30 tablet 11   No current facility-administered medications for this visit.      Allergies:   Morphine and related    Social History:  The patient  reports that he quit smoking about 2 years ago. His smoking use included Cigarettes. He has a 40.00 pack-year smoking history. He has never used smokeless tobacco. He reports that he drinks alcohol. He reports that he does not use drugs.   Family History:   Family history is unknown by patient.    Review of Systems: Review of Systems  Constitutional: Negative.   Respiratory: Negative.   Cardiovascular: Negative.   Gastrointestinal: Negative.   Musculoskeletal: Negative.   Neurological: Negative.   Psychiatric/Behavioral: Negative.   All other systems reviewed and are negative.    PHYSICAL EXAM: VS:  BP 126/60 (BP Location: Left Arm, Patient Position: Sitting, Cuff Size: Normal)   Pulse (!) 53   Ht 6' (1.829 m)   Wt 195 lb (88.5 kg)   BMI 26.45 kg/m  , BMI Body mass index is 26.45 kg/m. GEN: Well nourished, well developed, in no acute distress  HEENT: normal  Neck: no JVD, carotid bruits, or masses Cardiac: RRR; no murmurs, rubs, or gallops,no edema  Respiratory:  clear to auscultation bilaterally, normal work of breathing GI: soft, nontender, nondistended, + BS MS: no deformity or atrophy  Skin: warm and dry, no rash Neuro:  Strength and sensation are intact Psych: euthymic mood, full affect    Recent Labs: No results found for requested labs within last 8760 hours.    Lipid Panel Lab Results  Component Value Date   CHOL 124 03/27/2014   HDL 44 03/27/2014   LDLCALC 66 03/27/2014   TRIG 72 03/27/2014      Wt Readings from Last 3 Encounters:  10/14/16 195 lb (88.5 kg)  04/15/16 188 lb 8 oz (85.5 kg)  01/01/16 185 lb (83.9 kg)       ASSESSMENT AND PLAN:  Essential hypertension - Plan: EKG 12-Lead Blood pressure is well controlled on today's visit. No changes made to the medications.  Hyperlipidemia with target LDL less than 70 - Plan: EKG 12-Lead Recommended that he stay on his pravastatin, add zetia daily Call LDL  less than 70  NSTEMI (non-ST elevated myocardial infarction) ([place]) - Plan: EKG 12-Lead Denies any anginal symptoms, no further workup at this time EKG unchanged  Atherosclerosis of native coronary artery of native heart with unstable angina pectoris ([place]) - Plan: EKG 12-Lead Currently with no symptoms of angina. No further workup at this time. Continue current medication regimen.  Ischemic cardiomyopathy - Plan: EKG 12-Lead Low blood pressure, limited in advancing his beta blocker Need to consider changing his ACE inhibitor to entresto. , Also adding Aldactone This can be done on his next clinic visit  Tobacco abuse - Plan: EKG 12-Lead Stop smoking after bypass surgery  S/P CABG x 4 - Plan: EKG 12-Lead Currently on aspirin and brilinta 60 twice a day   Total encounter time more than 25 minutes  Greater than 50% was spent in counseling and coordination of care with the patient   Disposition:   F/U  6 months   Orders Placed This Encounter  Procedures  . EKG 12-Lead  Signed, Esmond Plants, M.D., Ph.D. 10/14/2016  [place], [place]

## 2016-10-14 ENCOUNTER — Encounter: Payer: Self-pay | Admitting: Cardiovascular Disease

## 2016-10-14 ENCOUNTER — Ambulatory Visit (INDEPENDENT_AMBULATORY_CARE_PROVIDER_SITE_OTHER): Payer: BLUE CROSS/BLUE SHIELD | Admitting: Cardiovascular Disease

## 2016-10-14 VITALS — BP 126/60 | HR 53 | Ht 72.0 in | Wt 195.0 lb

## 2016-10-14 DIAGNOSIS — I255 Ischemic cardiomyopathy: Secondary | ICD-10-CM

## 2016-10-14 DIAGNOSIS — I25110 Atherosclerotic heart disease of native coronary artery with unstable angina pectoris: Secondary | ICD-10-CM | POA: Diagnosis not present

## 2016-10-14 DIAGNOSIS — Z720 Tobacco use: Secondary | ICD-10-CM | POA: Diagnosis not present

## 2016-10-14 DIAGNOSIS — I214 Non-ST elevation (NSTEMI) myocardial infarction: Secondary | ICD-10-CM | POA: Diagnosis not present

## 2016-10-14 DIAGNOSIS — E785 Hyperlipidemia, unspecified: Secondary | ICD-10-CM

## 2016-10-14 DIAGNOSIS — I10 Essential (primary) hypertension: Secondary | ICD-10-CM

## 2016-10-14 DIAGNOSIS — Z951 Presence of aortocoronary bypass graft: Secondary | ICD-10-CM

## 2016-10-14 MED ORDER — TICAGRELOR 60 MG PO TABS: 60.0000 mg | ORAL_TABLET | Freq: Two times a day (BID) | ORAL | 3 refills | Status: DC

## 2016-10-14 MED ORDER — EZETIMIBE 10 MG PO TABS: 10.0000 mg | ORAL_TABLET | Freq: Every day | ORAL | 3 refills | Status: DC

## 2016-10-14 NOTE — Patient Instructions (Addendum)
Medication Instructions:   Please add zetia one a day for cholesterol  Labwork:  No new labs needed  Testing/Procedures:  No further testing at this time   I recommend watching educational videos on topics of interest to you at:       www.goemmi.com  Enter code: [email or web address]    Follow-Up: It was a pleasure seeing you in the office today. Please call us if you have new issues that need to be addressed before your next appt.  [phone]  Your physician wants you to follow-up in: 6 months.  You will receive a reminder letter in the mail two months in advance. If you don't receive a letter, please call our office to schedule the follow-up appointment.  If you need a refill on your cardiac medications before your next appointment, please call your pharmacy.

## 2017-02-07 ENCOUNTER — Telehealth: Payer: Self-pay | Admitting: Cardiovascular Disease

## 2017-02-07 NOTE — Telephone Encounter (Signed)
Pt states with his insurance the Kary Kos is too expensive. He asks can he be switched to Plavix, generic brand, please call and advise.

## 2017-02-07 NOTE — Telephone Encounter (Signed)
Would stay on aspirin Okay to start Plavix 75 mg daily Stop brilinta

## 2017-02-08 NOTE — Telephone Encounter (Signed)
Left voicemail message to call back  

## 2017-02-09 MED ORDER — CLOPIDOGREL BISULFATE 75 MG PO TABS: 75.0000 mg | ORAL_TABLET | Freq: Every day | ORAL | 3 refills | Status: DC

## 2017-02-09 NOTE — Telephone Encounter (Signed)
Spoke w/ pt's wife.   Advised her of Dr. Donivan Scull recommendation.  She verbalizes understanding and is appreciative.

## 2017-02-09 NOTE — Telephone Encounter (Signed)
Left message for pt to call back  °

## 2017-04-06 DIAGNOSIS — G20A1 Parkinson's disease without dyskinesia, without mention of fluctuations: Secondary | ICD-10-CM | POA: Insufficient documentation

## 2017-04-06 DIAGNOSIS — G20 Parkinson's disease: Secondary | ICD-10-CM | POA: Insufficient documentation

## 2017-04-19 ENCOUNTER — Other Ambulatory Visit: Payer: Self-pay | Admitting: Cardiovascular Disease

## 2017-05-27 NOTE — Progress Notes (Signed)
Cardiology Office Note  Date:  05/29/2017   ID:  [name] [name], DOB Apr 10, 1951, MRN [id number]  PCP:  [name] Billet, MD   Chief Complaint  Patient presents with  . other    6 month follow up. Patient denies chest pain and SOB. Meds reviewed verbally with patient.     HPI:  HPI Comments: [name] [name] is a 66 year old gentleman with long history of smoking,   hospital 03/24/2014 for chest pain, troponin of 12, non-STEMI,  catheterization showed occluded mid RCA, occluded mid circumflex, severe proximal LAD disease,occluded diagonal vessel  ejection fraction 35%. 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[employer], no regular exercise program  Denies any chest pain concerning for angina Does not check his blood pressure at home  Lab work reviewed with him the Total chol 156, ldl 86 Fasting glucose 87 HBa1C 5.7  EKG on today's visit shows normal sinus rhythm with rate 53 bpm, nonspecific ST and T wave abnormality   Other past medical history Cardiac catheterization report details 100% occluded proximal diagonal vessel, occluded mid RCA, occluded mid circumflex, severe proximal LAD disease with collaterals from left to right suggesting CTO, left to left collaterals to OM 3 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  FFR was performed on the LAD and this was significant.  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  PSH:    Past Surgical History:  Procedure Laterality Date  . ADENOIDECTOMY    . CARDIAC CATHETERIZATION     a. 03/2014 NSTEMI/Cath: LM nl, LAD 62m (FFR 0.76), D1 162m, LCX 180m, RCA 127m, EF 35%  . CORONARY ARTERY BYPASS GRAFTING (CABG) x  4 using left internal mammary artery and bilateral saphenous leg vein. N/A 03/28/2014   Performed by [name] Alberts, MD at [facility]  . INTRAOPERATIVE TRANSESOPHAGEAL ECHOCARDIOGRAM N/A 03/28/2014   Performed by [name] Alberts, MD at [facility] OR    Current Outpatient Medications  Medication Sig Dispense Refill  . aspirin 81 MG EC tablet Take 1 tablet (81 mg total) by mouth daily. 30 tablet 0  . carbidopa-levodopa (SINEMET IR) 25-100 MG tablet Take 1/2 tablet 3 times daily    . cholecalciferol (VITAMIN D) 1000 units tablet Take 1,000 Units by mouth daily.    . clopidogrel (PLAVIX) 75 MG tablet Take 1 tablet (75 mg total) daily by mouth. 90 tablet 3  . ezetimibe (ZETIA) 10 MG tablet Take 1 tablet (10 mg total) daily by mouth. 90 tablet 3  . lisinopril (PRINIVIL,ZESTRIL) 20 MG tablet Take 1 tablet (20 mg total) daily by mouth. 90 tablet 3  . metoprolol tartrate (LOPRESSOR) 25 MG tablet Take 0.5 tablets (12.5 mg total) 2 (two) times daily by mouth. 90 tablet 3  . Multiple Vitamin (MULTI-VITAMINS) TABS Take by mouth.    . pravastatin (PRAVACHOL) 40 MG tablet Take  1 tablet (40 mg total) at bedtime by mouth. 90 tablet 3   No current facility-administered medications for this visit.      Allergies:   Morphine and related   Social History:  The patient  reports that he quit smoking about 3 years ago. His smoking use included cigarettes. He has a 40.00 pack-year smoking history. he has never used smokeless tobacco. He reports that he drinks alcohol. He reports that he does not use drugs.   Family History:   Family history is  unknown by patient.    Review of Systems: Review of Systems  Constitutional: Negative.   Respiratory: Negative.   Cardiovascular: Negative.   Gastrointestinal: Negative.   Musculoskeletal: Negative.   Neurological: Negative.   Psychiatric/Behavioral: Negative.   All other systems reviewed and are negative.    PHYSICAL EXAM: VS:  BP (!) 153/68 (BP Location: Left Arm, Patient Position: Sitting, Cuff Size: Normal)   Pulse (!) 53   Ht 6' (1.829 m)   Wt 213 lb (96.6 kg)   BMI 28.89 kg/m  , BMI Body mass index is 28.89 kg/m. GEN: Well nourished, well developed, in no acute distress  HEENT: normal  Neck: no JVD, carotid bruits, or masses Cardiac: RRR; no murmurs, rubs, or gallops,no edema  Respiratory:  clear to auscultation bilaterally, normal work of breathing GI: soft, nontender, nondistended, + BS MS: no deformity or atrophy  Skin: warm and dry, no rash Neuro:  Strength and sensation are intact Psych: euthymic mood, full affect    Recent Labs: No results found for requested labs within last 8760 hours.    Lipid Panel Lab Results  Component Value Date   CHOL 124 03/27/2014   HDL 44 03/27/2014   LDLCALC 66 03/27/2014   TRIG 72 03/27/2014      Wt Readings from Last 3 Encounters:  05/29/17 213 lb (96.6 kg)  10/14/16 195 lb (88.5 kg)  04/15/16 188 lb 8 oz (85.5 kg)       ASSESSMENT AND PLAN:  Essential hypertension - Plan: EKG 12-Lead Blood pressure elevated on today's visit We have recommended he increase lisinopril up to 20 mg daily Work on his weight, monitor blood pressure at home and call our office if numbers continue to run high Stay on low-dose metoprolol  Hyperlipidemia with target LDL less than 70 - Plan: EKG 12-Lead Recommended that he stay on his pravastatin, add zetia daily Numbers will likely trend higher given 25 pound weight gain  NSTEMI (non-ST elevated myocardial infarction) ([place]) - Plan: EKG 12-Lead Denies any anginal symptoms, no  further workup at this time EKG unchanged.  Stable  Atherosclerosis of native coronary artery of native heart with unstable angina pectoris ([place]) - Plan: EKG 12-Lead Currently with no symptoms of angina. No further workup at this time. Continue current medication regimen.  Stable  Ischemic cardiomyopathy - Plan: EKG 12-Lead In follow-up consider changing lisinopril to Entresto, adding Aldactone as his ejection fraction less than 40%  Tobacco abuse - Plan: EKG 12-Lead Stopped smoking after bypass surgery Has not restarted  S/P CABG x 4 - Plan: EKG 12-Lead Currently on aspirin and Plavix Will discuss with him changing Plavix to Xarelto 2.5 mg twice daily with aspirin    Total encounter time more than 25 minutes  Greater than 50% was spent in counseling and coordination of care with the patient   Disposition:   F/U  12 months   Orders Placed This Encounter  Procedures  . EKG 12-Lead  Signed, Esmond Plants, M.D., Ph.D. 05/29/2017  [place], [place]

## 2017-05-29 ENCOUNTER — Ambulatory Visit (INDEPENDENT_AMBULATORY_CARE_PROVIDER_SITE_OTHER): Payer: Medicare Other | Admitting: Cardiovascular Disease

## 2017-05-29 ENCOUNTER — Encounter: Payer: Self-pay | Admitting: Cardiovascular Disease

## 2017-05-29 VITALS — BP 153/68 | HR 53 | Ht 72.0 in | Wt 213.0 lb

## 2017-05-29 DIAGNOSIS — I25110 Atherosclerotic heart disease of native coronary artery with unstable angina pectoris: Secondary | ICD-10-CM

## 2017-05-29 DIAGNOSIS — Z720 Tobacco use: Secondary | ICD-10-CM

## 2017-05-29 DIAGNOSIS — I255 Ischemic cardiomyopathy: Secondary | ICD-10-CM | POA: Diagnosis not present

## 2017-05-29 DIAGNOSIS — I10 Essential (primary) hypertension: Secondary | ICD-10-CM

## 2017-05-29 DIAGNOSIS — I200 Unstable angina: Secondary | ICD-10-CM | POA: Diagnosis not present

## 2017-05-29 DIAGNOSIS — Z951 Presence of aortocoronary bypass graft: Secondary | ICD-10-CM

## 2017-05-29 DIAGNOSIS — E785 Hyperlipidemia, unspecified: Secondary | ICD-10-CM | POA: Diagnosis not present

## 2017-05-29 MED ORDER — EZETIMIBE 10 MG PO TABS: 10.0000 mg | ORAL_TABLET | Freq: Every day | ORAL | 3 refills | Status: DC

## 2017-05-29 MED ORDER — CLOPIDOGREL BISULFATE 75 MG PO TABS: 75.0000 mg | ORAL_TABLET | Freq: Every day | ORAL | 3 refills | Status: DC

## 2017-05-29 MED ORDER — LISINOPRIL 20 MG PO TABS: 20.0000 mg | ORAL_TABLET | Freq: Every day | ORAL | 3 refills | Status: DC

## 2017-05-29 MED ORDER — PRAVASTATIN SODIUM 40 MG PO TABS: 40.0000 mg | ORAL_TABLET | Freq: Every day | ORAL | 3 refills | Status: DC

## 2017-05-29 MED ORDER — METOPROLOL TARTRATE 25 MG PO TABS: 12.5000 mg | ORAL_TABLET | Freq: Two times a day (BID) | ORAL | 3 refills | Status: DC

## 2017-05-29 NOTE — Patient Instructions (Addendum)
Medication Instructions:   Please increase the lisinopril up to 20 mg daily for high blood pressure  Stay on low dose metoprolol  Labwork:  No new labs needed  Testing/Procedures:  No further testing at this time  Follow-Up: It was a pleasure seeing you in the office today. Please call us if you have new issues that need to be addressed before your next appt.  [phone]  Your physician wants you to follow-up in: 12 months.  You will receive a reminder letter in the mail two months in advance. If you don't receive a letter, please call our office to schedule the follow-up appointment.  If you need a refill on your cardiac medications before your next appointment, please call your pharmacy.

## 2017-10-30 ENCOUNTER — Ambulatory Visit (INDEPENDENT_AMBULATORY_CARE_PROVIDER_SITE_OTHER): Payer: Medicare Other

## 2017-10-30 ENCOUNTER — Encounter (INDEPENDENT_AMBULATORY_CARE_PROVIDER_SITE_OTHER): Payer: Self-pay

## 2017-10-30 ENCOUNTER — Other Ambulatory Visit (INDEPENDENT_AMBULATORY_CARE_PROVIDER_SITE_OTHER): Payer: Self-pay | Admitting: Internal Medicine

## 2017-10-30 DIAGNOSIS — I739 Peripheral vascular disease, unspecified: Secondary | ICD-10-CM | POA: Diagnosis not present

## 2017-11-06 ENCOUNTER — Encounter (INDEPENDENT_AMBULATORY_CARE_PROVIDER_SITE_OTHER): Payer: Self-pay | Admitting: *Deleted

## 2017-11-30 ENCOUNTER — Other Ambulatory Visit: Payer: Self-pay | Admitting: Student

## 2017-11-30 DIAGNOSIS — G9519 Other vascular myelopathies: Secondary | ICD-10-CM

## 2017-11-30 DIAGNOSIS — M48062 Spinal stenosis, lumbar region with neurogenic claudication: Secondary | ICD-10-CM

## 2017-12-19 ENCOUNTER — Ambulatory Visit
Admission: RE | Admit: 2017-12-19 | Discharge: 2017-12-19 | Disposition: A | Discharge status: Home / Self Care | Payer: Medicare Other | Source: Ambulatory Visit | Attending: Student | Admitting: Student

## 2017-12-19 DIAGNOSIS — M48062 Spinal stenosis, lumbar region with neurogenic claudication: Secondary | ICD-10-CM | POA: Insufficient documentation

## 2017-12-19 DIAGNOSIS — M48061 Spinal stenosis, lumbar region without neurogenic claudication: Secondary | ICD-10-CM | POA: Insufficient documentation

## 2017-12-19 DIAGNOSIS — G9519 Other vascular myelopathies: Secondary | ICD-10-CM

## 2018-04-24 ENCOUNTER — Ambulatory Visit: Payer: Self-pay | Admitting: General Surgery

## 2018-05-10 ENCOUNTER — Other Ambulatory Visit: Payer: Self-pay

## 2018-05-10 ENCOUNTER — Encounter: Payer: Self-pay | Admitting: General Surgery

## 2018-05-10 ENCOUNTER — Ambulatory Visit (INDEPENDENT_AMBULATORY_CARE_PROVIDER_SITE_OTHER): Payer: Medicare Other | Admitting: General Surgery

## 2018-05-10 VITALS — BP 166/77 | HR 60 | Temp 97.7°F | Resp 14 | Ht 72.0 in | Wt 218.6 lb

## 2018-05-10 DIAGNOSIS — Z86010 Personal history of colonic polyps: Secondary | ICD-10-CM | POA: Diagnosis not present

## 2018-05-10 MED ORDER — POLYETHYLENE GLYCOL 3350 17 GM/SCOOP PO POWD: ORAL | 0 refills | Status: DC

## 2018-05-10 NOTE — Progress Notes (Signed)
Patient ID: [name] [name], male   DOB: Jun 19, 1951, 67 y.o.   MRN: [id number]  Chief Complaint  Patient presents with  . Follow-up    5 year colonoscopy    HPI [name] [name] is a 67 y.o. male.  Here today for 5 year Colonoscopy. Patient complains of constipation. Firmer stools causing him to strain. Denies blood in stool. Bowel movements daily.  No cardiac symptoms since his bypass in September 2015.  The patient lost peripheral vision in the right eye after the surgery although this is slowly been returning.   HPI  Past Medical History:  Diagnosis Date  . Acute non-Q wave ST elevation myocardial infarction (STEMI) 9/13-14/2015  . Atherosclerotic heart disease of native coronary artery with unstable angina pectoris ([place])    a. 03/2014 NSTEMI/Cath: LM nl, LAD 78m (FFR 0.76), D1 162m, LCX 113m, RCA 169m, EF 35%  . Essential hypertension   . GERD (gastroesophageal reflux disease)   . Hyperlipidemia with target LDL less than 70   . Ischemic cardiomyopathy    a. 03/2014 EF 35% by LV gram.  . Peripheral vision loss    right eye   . 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 Tobacco abuse     Past Surgical History:  Procedure Laterality Date  . ADENOIDECTOMY    . CARDIAC CATHETERIZATION     a. 03/2014 NSTEMI/Cath: LM nl, LAD 72m (FFR 0.76), D1 120m, LCX 126m, RCA 122m, EF 35%  . CORONARY ARTERY BYPASS GRAFT N/A 03/28/2014   Procedure: CORONARY ARTERY BYPASS GRAFTING (CABG) x  4 using left internal mammary artery and bilateral saphenous leg vein.;  Surgeon: [name] Alberts, MD;  Location: [place];  Service: Open Heart Surgery;  Laterality: N/A;  . INTRAOPERATIVE TRANSESOPHAGEAL ECHOCARDIOGRAM N/A 03/28/2014   Procedure: INTRAOPERATIVE TRANSESOPHAGEAL ECHOCARDIOGRAM;  Surgeon: [name] Alberts, MD;  Location: [place];  Service: Open Heart Surgery;  Laterality: N/A;    Family History  Family history unknown: Yes     Social History Social History   Tobacco Use  . Smoking status: Former Smoker    Packs/day: 1.00    Years: 40.00    Pack years: 40.00    Types: Cigarettes    Last attempt to quit: 03/24/2014    Years since quitting: 4.1  . Smokeless tobacco: Never Used  Substance Use Topics  . Alcohol use: Yes    Comment: 4/week  . Drug use: No    Allergies  Allergen Reactions  . Sulfa Antibiotics Hives and Nausea And Vomiting  . Other Anxiety and Other (See Comments)  . Morphine Anxiety  . Morphine And Related Anxiety    Current Outpatient Medications  Medication Sig Dispense Refill  . aspirin 81 MG EC tablet Take 1 tablet (81 mg total) by mouth daily. 30 tablet 0  . carbidopa-levodopa (SINEMET IR) 25-100 MG tablet Patient reports he is taking one tablet 3 times daily.    . cholecalciferol (VITAMIN D) 1000 units tablet Take 1,000 Units by mouth daily.    . clopidogrel (PLAVIX) 75 MG tablet Take 1 tablet (75 mg total) daily by mouth. 90 tablet 3  . ezetimibe (ZETIA) 10 MG tablet Take 1 tablet (10 mg total) daily by mouth. 90 tablet 3  . ibuprofen (ADVIL,MOTRIN) 600 MG tablet TAKE 1 TABLET BY MOUTH EVERY 6 HOURS AS NEEDED FOR PAIN. DISCONTINUE ORDER  WHEN COMPLETED  0  . lisinopril (PRINIVIL,ZESTRIL) 20 MG tablet Take 1 tablet (20 mg total) daily by mouth. 90 tablet 3  . metoprolol tartrate (LOPRESSOR) 25 MG tablet Take 0.5 tablets (12.5 mg total) 2 (two) times daily by mouth. 90 tablet 3  . Multiple Vitamin (MULTI-VITAMINS) TABS Take by mouth.    . pravastatin (PRAVACHOL) 40 MG tablet Take 1 tablet (40 mg total) at bedtime by mouth. 90 tablet 3  . polyethylene glycol powder (GLYCOLAX/MIRALAX) powder 255 grams one bottle for colonoscopy prep 255 g 0   No current facility-administered medications for this visit.     Review of Systems Review of Systems  Constitutional: Negative.   Respiratory: Negative.   Skin: Negative.     Blood pressure (!) 166/77, pulse 60, temperature 97.7 F  (36.5 C), temperature source Temporal, resp. rate 14, height 6' (1.829 m), weight 218 lb 9.6 oz (99.2 kg), SpO2 97 %.  Physical Exam Physical Exam  Constitutional: He is oriented to person, place, and time. He appears well-developed and well-nourished.  Cardiovascular: Normal rate and regular rhythm.  Murmur heard.  Systolic murmur is present with a grade of 2/6. No change in his cardiac murmur since 2014.  Pulmonary/Chest: Effort normal and breath sounds normal.  Neurological: He is alert and oriented to person, place, and time.  Skin: Skin is warm and dry.    Data Reviewed April 10, 2013 colonoscopy report: Dr. 12 mm polyp in the distal transverse colon.  5 mm rectal polyp.  Pathology below.  Diverticulosis in the sigmoid and ascending colon.  Part A: DISTAL TRANSVERSE COLON POLYP HOT SNARED:  - TUBULAR ADENOMA, MULTIPLE FRAGMENTS, LARGEST IS 0.5 CM.  - NEGATIVE FOR HIGH GRADE DYSPLASIA AND MALIGNANCY.  .  Part B: RECTAL POLYP COLD BIOPSIES:  - TUBULAR ADENOMA.  - NEGATIVE FOR HIGH GRADE DYSPLASIA AND MALIGNANCY.  MSO/04/11/2013   Assessment    Candidate for repeat colonoscopy.     Plan  Indication for follow-up colonoscopy reviewed.  Patient will hold his Plavix for 1 week but continue aspirin.  With his history of mild constipation over the last couple of years, we will have him make use of Dulcolax today prior to the prep.  HPI, Physical Exam, Assessment and Plan have been scribed under the direction and in the presence of [name] Bellow, MD. [name] [name], CMA  I have completed the exam and reviewed the above documentation for accuracy and completeness.  I agree with the above.  [name] [name] has been used and any errors in dictation or transcription are unintentional.  [name] [name], M.D., F.A.C.S.  [name] [name] [name] [name] 05/10/2018, 9:51 PM  Patient has been scheduled for a colonoscopy on 05-30-18 at [facility]. Miralax prescription has been sent in to the  patient's pharmacy today. The patient has been asked to take Duloclax 10 mg morning and afternoon day prior to prep. Patient has been asked to stop Plavix seven days prior to procedure. It is okay for patient to continue an 81 mg aspirin once daily. Colonoscopy instructions have been reviewed with the patient. This patient is aware to call the office if they have further questions.   Dominga Ferry, CMA

## 2018-05-29 ENCOUNTER — Encounter: Payer: Self-pay | Admitting: Student

## 2018-05-30 ENCOUNTER — Ambulatory Visit
Admission: RE | Admit: 2018-05-30 | Discharge: 2018-05-30 | Disposition: A | Discharge status: Home / Self Care | Payer: Medicare Other | Source: Ambulatory Visit | Attending: General Surgery | Admitting: General Surgery

## 2018-05-30 ENCOUNTER — Encounter
Admission: RE | Disposition: A | Discharge status: Home / Self Care | Payer: Self-pay | Source: Ambulatory Visit | Attending: General Surgery

## 2018-05-30 ENCOUNTER — Ambulatory Visit: Payer: Medicare Other | Admitting: Registered Nurse

## 2018-05-30 DIAGNOSIS — D123 Benign neoplasm of transverse colon: Secondary | ICD-10-CM | POA: Diagnosis not present

## 2018-05-30 DIAGNOSIS — I255 Ischemic cardiomyopathy: Secondary | ICD-10-CM | POA: Diagnosis not present

## 2018-05-30 DIAGNOSIS — E785 Hyperlipidemia, unspecified: Secondary | ICD-10-CM | POA: Diagnosis not present

## 2018-05-30 DIAGNOSIS — K5730 Diverticulosis of large intestine without perforation or abscess without bleeding: Secondary | ICD-10-CM | POA: Insufficient documentation

## 2018-05-30 DIAGNOSIS — Z1211 Encounter for screening for malignant neoplasm of colon: Secondary | ICD-10-CM | POA: Diagnosis not present

## 2018-05-30 DIAGNOSIS — I10 Essential (primary) hypertension: Secondary | ICD-10-CM | POA: Insufficient documentation

## 2018-05-30 DIAGNOSIS — D120 Benign neoplasm of cecum: Secondary | ICD-10-CM

## 2018-05-30 DIAGNOSIS — K219 Gastro-esophageal reflux disease without esophagitis: Secondary | ICD-10-CM | POA: Diagnosis not present

## 2018-05-30 DIAGNOSIS — Z87891 Personal history of nicotine dependence: Secondary | ICD-10-CM | POA: Diagnosis not present

## 2018-05-30 DIAGNOSIS — Z7902 Long term (current) use of antithrombotics/antiplatelets: Secondary | ICD-10-CM | POA: Insufficient documentation

## 2018-05-30 DIAGNOSIS — Z79899 Other long term (current) drug therapy: Secondary | ICD-10-CM | POA: Diagnosis not present

## 2018-05-30 DIAGNOSIS — Z7982 Long term (current) use of aspirin: Secondary | ICD-10-CM | POA: Diagnosis not present

## 2018-05-30 DIAGNOSIS — Z86010 Personal history of colonic polyps: Secondary | ICD-10-CM | POA: Insufficient documentation

## 2018-05-30 DIAGNOSIS — I252 Old myocardial infarction: Secondary | ICD-10-CM | POA: Insufficient documentation

## 2018-05-30 DIAGNOSIS — K635 Polyp of colon: Secondary | ICD-10-CM | POA: Insufficient documentation

## 2018-05-30 DIAGNOSIS — Z885 Allergy status to narcotic agent status: Secondary | ICD-10-CM | POA: Insufficient documentation

## 2018-05-30 DIAGNOSIS — Z951 Presence of aortocoronary bypass graft: Secondary | ICD-10-CM | POA: Insufficient documentation

## 2018-05-30 HISTORY — PX: COLONOSCOPY WITH PROPOFOL: SHX5780

## 2018-05-30 SURGERY — COLONOSCOPY WITH PROPOFOL
Anesthesia: General

## 2018-05-30 MED ORDER — PROPOFOL 500 MG/50ML IV EMUL
INTRAVENOUS | Status: DC | PRN
  Administered 2018-05-30: 140 ug/kg/min via INTRAVENOUS

## 2018-05-30 MED ORDER — PROPOFOL 10 MG/ML IV BOLUS
INTRAVENOUS | Status: AC
  Filled 2018-05-30: qty 20

## 2018-05-30 MED ORDER — LIDOCAINE HCL (CARDIAC) PF 100 MG/5ML IV SOSY
PREFILLED_SYRINGE | INTRAVENOUS | Status: DC | PRN
  Administered 2018-05-30: 40 mg via INTRAVENOUS

## 2018-05-30 MED ORDER — SODIUM CHLORIDE 0.9 % IV SOLN
INTRAVENOUS | Status: DC
  Administered 2018-05-30: 08:00:00 via INTRAVENOUS

## 2018-05-30 MED ORDER — LIDOCAINE HCL (PF) 2 % IJ SOLN
INTRAMUSCULAR | Status: AC
  Filled 2018-05-30: qty 10

## 2018-05-30 MED ORDER — PROPOFOL 500 MG/50ML IV EMUL
INTRAVENOUS | Status: AC
  Filled 2018-05-30: qty 50

## 2018-05-30 MED ORDER — PROPOFOL 10 MG/ML IV BOLUS
INTRAVENOUS | Status: DC | PRN
  Administered 2018-05-30: 70 mg via INTRAVENOUS

## 2018-05-30 MED ORDER — EPHEDRINE SULFATE 50 MG/ML IJ SOLN
INTRAMUSCULAR | Status: DC | PRN
  Administered 2018-05-30 (×3): 5 mg via INTRAVENOUS

## 2018-05-30 MED ORDER — GLYCOPYRROLATE 0.2 MG/ML IJ SOLN
INTRAMUSCULAR | Status: AC
  Filled 2018-05-30: qty 1

## 2018-05-30 NOTE — Anesthesia Postprocedure Evaluation (Signed)
Anesthesia Post Note  Patient: [name] [name]  Procedure(s) Performed: COLONOSCOPY WITH PROPOFOL (N/A )  Patient location during evaluation: Endoscopy Anesthesia Type: General Level of consciousness: awake and alert Pain management: pain level controlled Vital Signs Assessment: post-procedure vital signs reviewed and stable Respiratory status: spontaneous breathing, nonlabored ventilation and respiratory function stable Cardiovascular status: blood pressure returned to baseline and stable Postop Assessment: no apparent nausea or vomiting Anesthetic complications: no     Last Vitals:  Vitals:   05/30/18 0756 05/30/18 0916  BP: (!) 148/64 (!) 112/47  Pulse: (!) 53 (!) 55  Resp: 16 16  Temp: 36.4 C (!) 36 C  SpO2:  99%    Last Pain:  Vitals:   05/30/18 1010  TempSrc:   PainSc: 0-No pain                 Alphonsus Sias

## 2018-05-30 NOTE — Anesthesia Post-op Follow-up Note (Signed)
Anesthesia QCDR form completed.        

## 2018-05-30 NOTE — Anesthesia Preprocedure Evaluation (Signed)
Anesthesia Evaluation  Patient identified by MRN, date of birth, ID band Patient awake    Reviewed: Allergy & Precautions, H&P , NPO status , reviewed documented beta blocker date and time   Airway Mallampati: II  TM Distance: >3 FB Neck ROM: limited    Dental  (+) Partial Upper, Partial Lower   Pulmonary former smoker,    Pulmonary exam normal        Cardiovascular hypertension, + angina + CAD, + Past MI and + CABG  Normal cardiovascular exam  Normal LV size with mild LV hypertrophy. EF 30-35% with wall motion abnormalities as noted above, suggestive of LAD-territory infarction. Normal RV size and systolic function. No significant valvular abnormalities.   Neuro/Psych    GI/Hepatic GERD  Controlled,  Endo/Other    Renal/GU      Musculoskeletal   Abdominal   Peds  Hematology   Anesthesia Other Findings Past Medical History: 9/13-14/2015: Acute non-Q wave ST elevation myocardial infarction  (STEMI) No date: Atherosclerotic heart disease of native coronary artery with  unstable angina pectoris [facility])     Comment:  a. 03/2014 NSTEMI/Cath: LM nl, LAD 26m (FFR 0.76), D1               151m, LCX 125m, RCA 185m, EF 35% No date: Essential hypertension No date: GERD (gastroesophageal reflux disease) No date: Hyperlipidemia with target LDL less than 70 No date: Ischemic cardiomyopathy     Comment:  a. 03/2014 EF 35% by LV gram. No date: Peripheral vision loss     Comment:  right eye  03/28/2014: S/P CABG x 4     Comment:  LIMA to LAD, SVG to Diag, SVG to OM, SVG to PDA, EVH via              bilateral thighs and right lower leg  No date: Tobacco abuse  Past Surgical History: No date: ADENOIDECTOMY No date: CARDIAC CATHETERIZATION     Comment:  a. 03/2014 NSTEMI/Cath: LM nl, LAD 30m (FFR 0.76), D1               114m, LCX 19m, RCA 164m, EF 35% 03/28/2014: CORONARY ARTERY BYPASS GRAFT; N/A     Comment:  Procedure:  CORONARY ARTERY BYPASS GRAFTING (CABG) x  4               using left internal mammary artery and bilateral               saphenous leg vein.;  Surgeon: Rexene Alberts, MD;                Location: [place];  Service: Open Heart Surgery;                Laterality: N/A; 03/28/2014: INTRAOPERATIVE TRANSESOPHAGEAL ECHOCARDIOGRAM; N/A     Comment:  Procedure: INTRAOPERATIVE TRANSESOPHAGEAL               ECHOCARDIOGRAM;  Surgeon: Rexene Alberts, MD;  Location:              [place];  Service: Open Heart Surgery;  Laterality: N/A;  BMI    Body Mass Index:  28.48 kg/m      Reproductive/Obstetrics                             Anesthesia Physical Anesthesia Plan  ASA: III  Anesthesia Plan: General   Post-op Pain Management:    Induction: Intravenous  PONV Risk Score and Plan: 2 and Treatment may vary due to age or medical condition and TIVA  Airway Management Planned: Nasal Cannula and Natural Airway  Additional Equipment:   Intra-op Plan:   Post-operative Plan:   Informed Consent: I have reviewed the patients History and Physical, chart, labs and discussed the procedure including the risks, benefits and alternatives for the proposed anesthesia with the patient or authorized representative who has indicated his/her understanding and acceptance.   Dental Advisory Given  Plan Discussed with: CRNA  Anesthesia Plan Comments:         Anesthesia Quick Evaluation

## 2018-05-30 NOTE — Transfer of Care (Signed)
Immediate Anesthesia Transfer of Care Note  Patient: [name] [name]  Procedure(s) Performed: COLONOSCOPY WITH PROPOFOL (N/A )  Patient Location: PACU  Anesthesia Type:General  Level of Consciousness: sedated  Airway & Oxygen Therapy: Patient Spontanous Breathing and Patient connected to nasal cannula oxygen  Post-op Assessment: Report given to RN and Post -op Vital signs reviewed and stable  Post vital signs: Reviewed and stable  Last Vitals:  Vitals Value Taken Time  BP 112/47 05/30/2018  9:16 AM  Temp 36 C 05/30/2018  9:16 AM  Pulse 64 05/30/2018  9:20 AM  Resp 16 05/30/2018  9:20 AM  SpO2 100 % 05/30/2018  9:20 AM  Vitals shown include unvalidated device data.  Last Pain:  Vitals:   05/30/18 0916  TempSrc: Tympanic  PainSc: 0-No pain         Complications: No apparent anesthesia complications

## 2018-05-30 NOTE — H&P (Signed)
[name] [name] [id number] [birth year]     HPI: 67 year old male with past history of tubular adenomas from the distal transverse colon and rectum in 2014.  For follow-up exam.  He tolerated his prep well reporting basically clear stools this morning.  He discontinued his Plavix as requested 1 week ago.  He has continued on his daily pediatric aspirin.  Medications Prior to Admission  Medication Sig Dispense Refill Last Dose  . aspirin 81 MG EC tablet Take 1 tablet (81 mg total) by mouth daily. 30 tablet 0 05/30/2018 at Unknown time  . carbidopa-levodopa (SINEMET IR) 25-100 MG tablet Patient reports he is taking one tablet 3 times daily.   05/30/2018 at Unknown time  . cholecalciferol (VITAMIN D) 1000 units tablet Take 1,000 Units by mouth daily.   Past Week at Unknown time  . lisinopril (PRINIVIL,ZESTRIL) 20 MG tablet Take 1 tablet (20 mg total) daily by mouth. 90 tablet 3 05/30/2018 at Unknown time  . metoprolol tartrate (LOPRESSOR) 25 MG tablet Take 0.5 tablets (12.5 mg total) 2 (two) times daily by mouth. 90 tablet 3 05/30/2018 at Unknown time  . Multiple Vitamin (MULTI-VITAMINS) TABS Take by mouth.   Past Week at Unknown time  . polyethylene glycol powder (GLYCOLAX/MIRALAX) powder 255 grams one bottle for colonoscopy prep 255 g 0 05/29/2018 at Unknown time  . pravastatin (PRAVACHOL) 40 MG tablet Take 1 tablet (40 mg total) at bedtime by mouth. 90 tablet 3 05/29/2018 at Unknown time  . clopidogrel (PLAVIX) 75 MG tablet Take 1 tablet (75 mg total) daily by mouth. 90 tablet 3 05/22/2018  . ezetimibe (ZETIA) 10 MG tablet Take 1 tablet (10 mg total) daily by mouth. 90 tablet 3 Taking  . ibuprofen (ADVIL,MOTRIN) 600 MG tablet TAKE 1 TABLET BY MOUTH EVERY 6 HOURS AS NEEDED FOR PAIN. DISCONTINUE ORDER WHEN COMPLETED  0 Taking   Allergies  Allergen Reactions  . Sulfa Antibiotics Hives and Nausea And Vomiting  . Other Anxiety and Other (See Comments)  . Morphine Anxiety  . Morphine And Related  Anxiety   Past Medical History:  Diagnosis Date  . Acute non-Q wave ST elevation myocardial infarction (STEMI) 9/13-14/2015  . Atherosclerotic heart disease of native coronary artery with unstable angina pectoris ([place])    a. 03/2014 NSTEMI/Cath: LM nl, LAD 48m (FFR 0.76), D1 139m, LCX 195m, RCA 143m, EF 35%  . Essential hypertension   . GERD (gastroesophageal reflux disease)   . Hyperlipidemia with target LDL less than 70   . Ischemic cardiomyopathy    a. 03/2014 EF 35% by LV gram.  . Peripheral vision loss    right eye   . 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 Tobacco abuse    Past Surgical History:  Procedure Laterality Date  . ADENOIDECTOMY    . CARDIAC CATHETERIZATION     a. 03/2014 NSTEMI/Cath: LM nl, LAD 68m (FFR 0.76), D1 146m, LCX 175m, RCA 147m, EF 35%  . CORONARY ARTERY BYPASS GRAFT N/A 03/28/2014   Procedure: CORONARY ARTERY BYPASS GRAFTING (CABG) x  4 using left internal mammary artery and bilateral saphenous leg vein.;  Surgeon: Rexene Alberts, MD;  Location: [place];  Service: Open Heart Surgery;  Laterality: N/A;  . INTRAOPERATIVE TRANSESOPHAGEAL ECHOCARDIOGRAM N/A 03/28/2014   Procedure: INTRAOPERATIVE TRANSESOPHAGEAL ECHOCARDIOGRAM;  Surgeon: Rexene Alberts, MD;  Location: [place];  Service: Open  Heart Surgery;  Laterality: N/A;   Social History   Socioeconomic History  . Marital status: Married    Spouse name: Not on file  . Number of children: Not on file  . Years of education: Not on file  . Highest education level: Not on file  Occupational History  . Not on file  Social Needs  . Financial resource strain: Not on file  . Food insecurity:    Worry: Not on file    Inability: Not on file  . Transportation needs:    Medical: Not on file    Non-medical: Not on file  Tobacco Use  . Smoking status: Former Smoker    Packs/day: 1.00    Years: 40.00    Pack years: 40.00     Types: Cigarettes    Last attempt to quit: 03/24/2014    Years since quitting: 4.1  . Smokeless tobacco: Never Used  Substance and Sexual Activity  . Alcohol use: Yes    Comment: 4/week  . Drug use: No  . Sexual activity: Not on file  Lifestyle  . Physical activity:    Days per week: Not on file    Minutes per session: Not on file  . Stress: Not on file  Relationships  . Social connections:    Talks on phone: Not on file    Gets together: Not on file    Attends religious service: Not on file    Active member of club or organization: Not on file    Attends meetings of clubs or organizations: Not on file    Relationship status: Not on file  . Intimate partner violence:    Fear of current or ex partner: Not on file    Emotionally abused: Not on file    Physically abused: Not on file    Forced sexual activity: Not on file  Other Topics Concern  . Not on file  Social History Narrative  . Not on file   Social History   Social History Narrative  . Not on file     ROS: Negative.     PE: HEENT: Negative. Lungs: Clear. Cardio: RRForest Gleason Sallee [name] 05/30/2018

## 2018-05-30 NOTE — Op Note (Addendum)
[facility] Gastroenterology Patient Name: [name] [name] Procedure Date: 05/30/2018 8:18 AM MRN: [id number] Account #: [id number] Date of Birth: 1950-08-14 Admit Type: Outpatient Age: 67 Room: [facility] ENDO ROOM 1 Gender: Male Note Status: Finalized Procedure:            Colonoscopy Indications:          High risk colon cancer surveillance: Personal history                        of colonic polyps Providers:            Robert Bellow, MD Referring MD:         Leona Carry. Hall Busing, MD (Referring MD) Medicines:            Monitored Anesthesia Care Complications:        No immediate complications. Procedure:            Pre-Anesthesia Assessment:                       - Prior to the procedure, a History and Physical was                        performed, and patient medications, allergies and                        sensitivities were reviewed. The patient's tolerance of                        previous anesthesia was reviewed.                       - The risks and benefits of the procedure and the                        sedation options and risks were discussed with the                        patient. All questions were answered and informed                        consent was obtained.                       After obtaining informed consent, the colonoscope was                        passed under direct vision. Throughout the procedure,                        the patient's blood pressure, pulse, and oxygen                        saturations were monitored continuously. The                        Colonoscope was introduced through the anus and                        advanced to the the cecum, identified by appendiceal  orifice and ileocecal valve. The colonoscopy was                        somewhat difficult due to a tortuous colon. Successful                        completion of the procedure was aided by using manual                        pressure. The  patient tolerated the procedure well. The                        quality of the bowel preparation was good. Findings:      Two sessile polyps were found in the transverse colon and ileocecal       valve. The polyps were 6 mm in size. These were biopsied with a cold       forceps for histology.      Multiple medium-mouthed diverticula were found in the sigmoid colon.      The retroflexed view of the distal rectum and anal verge was normal and       showed no anal or rectal abnormalities. Impression:           - Two 6 mm polyps in the transverse colon and at the                        ileocecal valve. Biopsied.                       - Diverticulosis in the sigmoid colon.                       - The distal rectum and anal verge are normal on                        retroflexion view. Recommendation:       - Telephone endoscopist for pathology results in 1 week. Procedure Code(s):    --- Professional ---                       [id number], Colonoscopy, flexible; with biopsy, single or                        multiple Diagnosis Code(s):    --- Professional ---                       Z86.010, Personal history of colonic polyps                       D12.3, Benign neoplasm of transverse colon (hepatic                        flexure or splenic flexure)                       D12.0, Benign neoplasm of cecum                       K57.30, Diverticulosis of large intestine without  perforation or abscess without bleeding CPT copyright 2018 American Medical Association. All rights reserved. The codes documented in this report are preliminary and upon coder review may  be revised to meet current compliance requirements. Robert Bellow, MD 05/30/2018 9:13:38 AM This report has been signed electronically. Number of Addenda: 0 Note Initiated On: 05/30/2018 8:18 AM Scope Withdrawal Time: 0 hours 17 minutes 17 seconds  Total Procedure Duration: 0 hours 35 minutes 2 seconds        [facility]

## 2018-06-01 ENCOUNTER — Other Ambulatory Visit: Payer: Self-pay | Admitting: Cardiovascular Disease

## 2018-06-01 ENCOUNTER — Encounter: Payer: Self-pay | Admitting: General Surgery

## 2018-06-01 LAB — SURGICAL PATHOLOGY

## 2018-06-18 ENCOUNTER — Other Ambulatory Visit: Payer: Self-pay | Admitting: Cardiovascular Disease

## 2018-06-20 ENCOUNTER — Other Ambulatory Visit: Payer: Self-pay | Admitting: Cardiovascular Disease

## 2018-06-21 ENCOUNTER — Telehealth: Payer: Self-pay

## 2018-06-21 NOTE — Telephone Encounter (Signed)
-----   Message from Janan Ridge, [place] sent at 06/20/2018  8:42 AM EST ----- Regarding: appointment for refills Patient needs an appointment for further refills. I will send in enough medication until appointment can be made. If patient does not want to schedule an appointment please make them aware to contact PCP for refills.   Thanks Ladies!

## 2018-06-21 NOTE — Telephone Encounter (Signed)
No ans no vm   °

## 2018-06-26 NOTE — Telephone Encounter (Signed)
lmov to schedule appt  °

## 2018-06-28 ENCOUNTER — Other Ambulatory Visit: Payer: Self-pay | Admitting: Cardiovascular Disease

## 2018-06-28 NOTE — Telephone Encounter (Signed)
Pt needs appointment OD for 1 year f/u. Thank you.

## 2018-07-08 ENCOUNTER — Other Ambulatory Visit: Payer: Self-pay | Admitting: Cardiovascular Disease

## 2018-07-12 ENCOUNTER — Telehealth: Payer: Self-pay | Admitting: Cardiovascular Disease

## 2018-07-12 NOTE — Telephone Encounter (Signed)
Please review for refill. Thank You.

## 2018-07-12 NOTE — Telephone Encounter (Signed)
Pt requesting 90 day refill pt hasn't been seen since 05/2017. Please advise if ok to refill 90 day.

## 2018-07-12 NOTE — Telephone Encounter (Signed)
*  STAT* If patient is at the pharmacy, call can be transferred to refill team.   1. Which medications need to be refilled? (please list name of each medication and dose if known) Clopidogrel  2. Which pharmacy/location (including street and city if local pharmacy) is medication to be sent to? [facility] [place]  3. Do they need a 30 day or 90 day supply? [place]

## 2018-07-13 NOTE — Telephone Encounter (Signed)
Patient calling to check status of medication Patient scheduled to see Dr. Rockey Situ on 2/6 Please advise

## 2018-07-13 NOTE — Telephone Encounter (Signed)
Patient has made a follow up appointment for 08/16/2018 and is requesting Plavix refill.  The Plavix was taken off his medication list due to polyp removal on 05/30/2018.  The patient was told at Dr. Donivan Scull last office visit would discuss changing the plavix to Xarelto.  The patient was to follow up 05/2018 with Dr. Rockey Situ but just made appointment due to needing a refill.  Please review patient's chart for Plavix refill and refill if necessary with a call back to the patient with details.

## 2018-07-13 NOTE — Telephone Encounter (Signed)
To Dr. Gollan to review.  

## 2018-07-14 NOTE — Telephone Encounter (Signed)
He can stay on aspirin and plavix for now, Would send in plavix 90 days, no refill I can talk about xarelto 2.5 BID with him on next visit

## 2018-07-16 MED ORDER — CLOPIDOGREL BISULFATE 75 MG PO TABS: 75.0000 mg | ORAL_TABLET | Freq: Every day | ORAL | 0 refills | Status: DC

## 2018-07-16 NOTE — Telephone Encounter (Signed)
Patient not home. S/w patient's wife, ok per DPR. She verbalized understanding of plan to stay on aspirin and plavix for now and will discuss at office visit about changing. Patient is scheduled for February. She verbalized understanding. Rx sent to pharmacy.

## 2018-07-16 NOTE — Addendum Note (Signed)
Addended by: Vanessa Ralphs on: 07/16/2018 08:59 AM   Modules accepted: Orders

## 2018-07-20 ENCOUNTER — Other Ambulatory Visit: Payer: Self-pay | Admitting: Cardiovascular Disease

## 2018-08-13 ENCOUNTER — Other Ambulatory Visit: Payer: Self-pay | Admitting: Cardiovascular Disease

## 2018-08-14 NOTE — Progress Notes (Deleted)
Cardiology Office Note  Date:  08/14/2018   ID:  [name] [name], DOB 1951-04-27, MRN [id number]  PCP:  [name] Billet, MD   No chief complaint on file.   HPI:  HPI Comments: [name] [name] is a 68 year old gentleman with long history of smoking,   hospital 03/24/2014 for chest pain, troponin of 12, non-STEMI,  catheterization showed occluded mid RCA, occluded mid circumflex, severe proximal LAD disease,occluded diagonal vessel  ejection fraction 35%. 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[employer], no regular exercise program  Denies any chest pain concerning for angina Does not check his blood pressure at home  Lab work reviewed with him the Total chol 156, ldl 86 Fasting glucose 87 HBa1C 5.7  EKG on today's visit shows normal sinus rhythm with rate 53 bpm, nonspecific ST and T wave abnormality   Other past medical history Cardiac catheterization report details 100% occluded proximal diagonal vessel, occluded mid RCA, occluded mid circumflex, severe proximal LAD disease with collaterals from left to right suggesting CTO, left to left collaterals to OM 3 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  FFR was performed on the LAD and this was significant.  PMH:   has a past medical history of Acute non-Q wave ST elevation myocardial infarction (STEMI) (9/13-14/2015), Atherosclerotic heart disease of native coronary artery with unstable angina pectoris ([place]), Essential hypertension, GERD (gastroesophageal reflux disease), Hyperlipidemia with target LDL less than 70, Ischemic cardiomyopathy, Peripheral vision loss, S/P CABG x 4 (03/28/2014), and  Tobacco abuse.  PSH:    Past Surgical History:  Procedure Laterality Date  . ADENOIDECTOMY    . CARDIAC CATHETERIZATION     a. 03/2014 NSTEMI/Cath: LM nl, LAD 69m (FFR 0.76), D1 172m, LCX 152m, RCA 182m, EF 35%  . COLONOSCOPY WITH PROPOFOL N/A 05/30/2018   Procedure: COLONOSCOPY WITH PROPOFOL;  Surgeon: [name] Bellow, MD;  Location: [facility] ENDOSCOPY;  Service: Endoscopy;  Laterality: N/A;  . CORONARY ARTERY BYPASS GRAFT N/A 03/28/2014   Procedure: CORONARY ARTERY BYPASS GRAFTING (CABG) x  4 using left internal mammary artery and bilateral saphenous leg vein.;  Surgeon: [name] Alberts, MD;  Location: [place];  Service: Open Heart Surgery;  Laterality: N/A;  . INTRAOPERATIVE TRANSESOPHAGEAL ECHOCARDIOGRAM N/A 03/28/2014   Procedure: INTRAOPERATIVE TRANSESOPHAGEAL ECHOCARDIOGRAM;  Surgeon: [name] Alberts, MD;  Location: [place];  Service: Open Heart Surgery;  Laterality: N/A;    Current Outpatient Medications  Medication Sig Dispense Refill  . aspirin 81 MG EC tablet Take 1 tablet (81 mg total) by mouth daily. 30 tablet 0  . carbidopa-levodopa (SINEMET IR) 25-100 MG tablet Patient reports he is taking one tablet 3 times daily.    . cholecalciferol (VITAMIN D) 1000 units tablet Take 1,000 Units by mouth daily.    . clopidogrel (PLAVIX) 75 MG tablet Take 1 tablet (75 mg total) by mouth daily. 90 tablet 0  . ezetimibe (ZETIA) 10 MG tablet TAKE 1 TABLET BY MOUTH ONCE DAILY 90 tablet 0  . ibuprofen (ADVIL,MOTRIN) 600 MG tablet TAKE 1 TABLET BY MOUTH EVERY 6 HOURS AS NEEDED FOR PAIN. DISCONTINUE ORDER WHEN COMPLETED  0  .  lisinopril (PRINIVIL,ZESTRIL) 20 MG tablet TAKE 1 TABLET BY MOUTH ONCE DAILY 90 tablet 0  . lisinopril (PRINIVIL,ZESTRIL) 20 MG tablet TAKE 1 TABLET BY MOUTH ONCE DAILY 90 tablet 0  . metoprolol tartrate (LOPRESSOR) 25 MG tablet TAKE 1/2 (ONE-HALF) TABLET BY MOUTH TWICE DAILY 90 tablet 0  . Multiple Vitamin (MULTI-VITAMINS) TABS Take by mouth.    . pravastatin (PRAVACHOL) 40 MG  tablet TAKE 1 TABLET BY MOUTH AT BEDTIME 90 tablet 0   No current facility-administered medications for this visit.      Allergies:   Sulfa antibiotics; Other; Morphine; and Morphine and related   Social History:  The patient  reports that he quit smoking about 4 years ago. His smoking use included cigarettes. He has a 40.00 pack-year smoking history. He has never used smokeless tobacco. He reports current alcohol use. He reports that he does not use drugs.   Family History:   Family history is unknown by patient.    Review of Systems: Review of Systems  Constitutional: Negative.   Respiratory: Negative.   Cardiovascular: Negative.   Gastrointestinal: Negative.   Musculoskeletal: Negative.   Neurological: Negative.   Psychiatric/Behavioral: Negative.   All other systems reviewed and are negative.    PHYSICAL EXAM: VS:  There were no vitals taken for this visit. , BMI There is no height or weight on file to calculate BMI. GEN: Well nourished, well developed, in no acute distress  HEENT: normal  Neck: no JVD, carotid bruits, or masses Cardiac: RRR; no murmurs, rubs, or gallops,no edema  Respiratory:  clear to auscultation bilaterally, normal work of breathing GI: soft, nontender, nondistended, + BS MS: no deformity or atrophy  Skin: warm and dry, no rash Neuro:  Strength and sensation are intact Psych: euthymic mood, full affect    Recent Labs: No results found for requested labs within last 8760 hours.    Lipid Panel Lab Results  Component Value Date   CHOL 124 03/27/2014   HDL 44 03/27/2014   LDLCALC 66 03/27/2014   TRIG 72 03/27/2014      Wt Readings from Last 3 Encounters:  05/30/18 210 lb (95.3 kg)  05/10/18 218 lb 9.6 oz (99.2 kg)  05/29/17 213 lb (96.6 kg)       ASSESSMENT AND PLAN:  Essential hypertension - Plan: EKG 12-Lead Blood pressure elevated on today's visit We have recommended he increase lisinopril up to 20 mg daily Work on his  weight, monitor blood pressure at home and call our office if numbers continue to run high Stay on low-dose metoprolol  Hyperlipidemia with target LDL less than 70 - Plan: EKG 12-Lead Recommended that he stay on his pravastatin, add zetia daily Numbers will likely trend higher given 25 pound weight gain  NSTEMI (non-ST elevated myocardial infarction) ([place]) - Plan: EKG 12-Lead Denies any anginal symptoms, no further workup at this time EKG unchanged.  Stable  Atherosclerosis of native coronary artery of native heart with unstable angina pectoris ([place]) - Plan: EKG 12-Lead Currently with no symptoms of angina. No further workup at this time. Continue current medication regimen.  Stable  Ischemic cardiomyopathy - Plan: EKG 12-Lead In follow-up consider changing lisinopril to Entresto, adding Aldactone as his ejection fraction less than 40%  Tobacco abuse - Plan: EKG 12-Lead Stopped smoking after bypass surgery Has not restarted  S/P CABG x 4 - Plan: EKG 12-Lead Currently on aspirin and Plavix Will discuss with him changing Plavix to Xarelto 2.5 mg twice daily with aspirin  Total encounter time more than 25 minutes  Greater than 50% was spent in counseling and coordination of care with the patient   Disposition:   F/U  12 months   No orders of the defined types were placed in this encounter.    Signed, Esmond Plants, M.D., Ph.D. 08/14/2018  [facility] [place], [place] [phone]

## 2018-08-16 ENCOUNTER — Ambulatory Visit: Payer: Self-pay | Admitting: Cardiovascular Disease

## 2018-09-04 NOTE — Progress Notes (Signed)
Cardiology Office Note Date:  09/06/2018  Patient ID:  [name] [name] May 05, 1951, MRN [id number] PCP:  Albina Billet, MD  Cardiologist:  Dr. Rockey Situ, MD    Chief Complaint: Follow up  History of Present Illness: [name] [name] is a 68 y.o. male with history of CAD status post CABG on 6/54/6503, chronic systolic CHF secondary to ischemic cardiomyopathy, hypertension, hyperlipidemia, lumbar radiculitis with neurogenic claudication, prior tobacco abuse, Parkinson's disease, dysarthria working with speech therapy, OSA on CPAP with compliance, and GERD who presents for follow-up of his CAD and ischemic cardiomyopathy.  Patient was admitted to the hospital in 03/2014 with chest pain and found to have a non-STEMI with a troponin of 12.  Cardiac cath showed severe multivessel CAD including severe proximal LAD disease with collaterals from left to right suggesting CTO with FFR being positive, occluded mid LCx with left to left collaterals suggesting CTO, occluded mid RCA, occluded proximal diagonal.  Echo at that time showed an EF of 30 to 35%, mild LVH, mildly dilated LV, mid anterior septal/anterior lateral/inferolateral akinesis, basal anterior/septal/lateral akinesis as well as akinesis of the apex, grade 1 diastolic dysfunction, trivial aortic insufficiency, trivial mitral regurgitation, normal RVSF, trivial pericardial effusion.  In this setting, the patient underwent four-vessel CABG on 03/28/2014 with LIMA to LAD, SVG to diagonal, SVG to OM, SVG to PDA.  No ischemic evaluation since his bypass.  He was last seen in the office in 05/2017 and was doing well.  His weight was noted to be up 25 pounds over the prior year.  He attributed this to excess calorie intake and no regular exercise program.  Lower extremity arterial ultrasound in 10/2017 showed normal bilateral ABIs.  Labs: 05/2018- LDL 81  He comes in doing well from a cardiac perspective.  No chest pain, shortness of breath, palpitations,  presyncope, or syncope.  He does note some dizziness if he stands too quickly.  He reports blood pressure typically running in the 546F to 681E systolic.  No falls since he was last seen.  No BRBPR or melena.  No lower extremity swelling, orthopnea, PND, abdominal distention, or early satiety.  He reports his weight is up 5 pounds from his last office visit secondary to sedentary lifestyle and increased caloric intake.  He continues to note significant back pain and this seems to be his predominant focus at today's visit.  He reports previous intolerance to Lipitor secondary to myalgias/arthralgias.  He otherwise does not have any issues or concerns.   Past Medical History:  Diagnosis Date  . Acute non-Q wave ST elevation myocardial infarction (STEMI) 9/13-14/2015  . Atherosclerotic heart disease of native coronary artery with unstable angina pectoris ([place])    a. 03/2014 NSTEMI/Cath: LM nl, LAD 54m (FFR 0.76), D1 128m, LCX 148m, RCA 160m, EF 35%  . Essential hypertension   . GERD (gastroesophageal reflux disease)   . Hyperlipidemia with target LDL less than 70   . Ischemic cardiomyopathy    a. 03/2014 EF 35% by LV gram.  . Peripheral vision loss    right eye   . 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 Tobacco abuse     Past Surgical History:  Procedure Laterality Date  . ADENOIDECTOMY    . CARDIAC CATHETERIZATION     a. 03/2014 NSTEMI/Cath: LM nl, LAD 16m (FFR 0.76), D1 168m, LCX  132m, RCA 153m, EF 35%  . COLONOSCOPY WITH PROPOFOL N/A 05/30/2018   Procedure: COLONOSCOPY WITH PROPOFOL;  Surgeon: Robert Bellow, MD;  Location: [facility];  Service: Endoscopy;  Laterality: N/A;  . CORONARY ARTERY BYPASS GRAFT N/A 03/28/2014   Procedure: CORONARY ARTERY BYPASS GRAFTING (CABG) x  4 using left internal mammary artery and bilateral saphenous leg vein.;  Surgeon: Rexene Alberts, MD;  Location: [facility];  Service:  Open Heart Surgery;  Laterality: N/A;  . INTRAOPERATIVE TRANSESOPHAGEAL ECHOCARDIOGRAM N/A 03/28/2014   Procedure: INTRAOPERATIVE TRANSESOPHAGEAL ECHOCARDIOGRAM;  Surgeon: Rexene Alberts, MD;  Location: [facility];  Service: Open Heart Surgery;  Laterality: N/A;    Current Meds  Medication Sig  . aspirin 81 MG EC tablet Take 1 tablet (81 mg total) by mouth daily.  . carbidopa-levodopa (SINEMET IR) 25-100 MG tablet Patient reports he is taking one tablet 3 times daily.  . cholecalciferol (VITAMIN D) 1000 units tablet Take 1,000 Units by mouth daily.  . clopidogrel (PLAVIX) 75 MG tablet Take 1 tablet (75 mg total) by mouth daily.  Marland Kitchen ezetimibe (ZETIA) 10 MG tablet TAKE 1 TABLET BY MOUTH ONCE DAILY  . ibuprofen (ADVIL,MOTRIN) 600 MG tablet TAKE 1 TABLET BY MOUTH EVERY 6 HOURS AS NEEDED FOR PAIN. DISCONTINUE ORDER WHEN COMPLETED  . lisinopril (PRINIVIL,ZESTRIL) 20 MG tablet TAKE 1 TABLET BY MOUTH ONCE DAILY  . lisinopril (PRINIVIL,ZESTRIL) 20 MG tablet TAKE 1 TABLET BY MOUTH ONCE DAILY  . metoprolol tartrate (LOPRESSOR) 25 MG tablet TAKE 1/2 (ONE-HALF) TABLET BY MOUTH TWICE DAILY  . Multiple Vitamin (MULTI-VITAMINS) TABS Take by mouth.  . pravastatin (PRAVACHOL) 40 MG tablet TAKE 1 TABLET BY MOUTH AT BEDTIME    Allergies:   Sulfa antibiotics; Other; Morphine; and Morphine and related   Social History:  The patient  reports that he quit smoking about 4 years ago. His smoking use included cigarettes. He has a 40.00 pack-year smoking history. He has never used smokeless tobacco. He reports current alcohol use. He reports that he does not use drugs.   Family History:  The patient's Family history is unknown by patient.  ROS:   Review of Systems  Constitutional: Positive for malaise/fatigue. Negative for chills, diaphoresis, fever and weight loss.  HENT: Negative for congestion.   Eyes: Negative for discharge and redness.  Respiratory: Negative for cough, hemoptysis, sputum production, shortness of  breath and wheezing.   Cardiovascular: Negative for chest pain, palpitations, orthopnea, claudication, leg swelling and PND.  Gastrointestinal: Negative for abdominal pain, blood in stool, heartburn, melena, nausea and vomiting.  Genitourinary: Negative for hematuria.       ED  Musculoskeletal: Negative for falls and myalgias.  Skin: Negative for rash.  Neurological: Positive for dizziness and tremors. Negative for tingling, sensory change, speech change, focal weakness, loss of consciousness and weakness.  Endo/Heme/Allergies: Does not bruise/bleed easily.  Psychiatric/Behavioral: Negative for substance abuse. The patient is not nervous/anxious.   All other systems reviewed and are negative.    PHYSICAL EXAM:  VS:  BP (!) 148/72 (BP Location: Left Arm, Patient Position: Sitting, Cuff Size: Normal)   Pulse (!) 50   Ht 6' (1.829 m)   Wt 218 lb (98.9 kg)   BMI 29.57 kg/m  BMI: Body mass index is 29.57 kg/m.  Physical Exam  Constitutional: He is oriented to person, place, and time. He appears well-developed and well-nourished.  Resting tremor noted  HENT:  Head: Normocephalic and atraumatic.  Eyes: Right eye exhibits no discharge. Left eye  exhibits no discharge.  Neck: Normal range of motion. No JVD present.  Cardiovascular: Regular rhythm, S1 normal, S2 normal and normal heart sounds. Bradycardia present. Exam reveals no distant heart sounds, no friction rub, no midsystolic click and no opening snap.  No murmur heard. Pulses:      Posterior tibial pulses are 2+ on the right side and 2+ on the left side.  Pulmonary/Chest: Effort normal and breath sounds normal. No respiratory distress. He has no decreased breath sounds. He has no wheezes. He has no rales. He exhibits no tenderness.  Abdominal: Soft. He exhibits no distension. There is no abdominal tenderness.  Musculoskeletal:        General: No edema.  Neurological: He is alert and oriented to person, place, and time.  Skin: Skin  is warm and dry. No cyanosis. Nails show no clubbing.  Psychiatric: He has a normal mood and affect. His speech is normal and behavior is normal. Judgment and thought content normal.     EKG:  Was ordered and interpreted by me today. Shows sinus bradycardia, 50 bpm, baseline artifact secondary to tremor associated with Parkinson's disease, lateral st/t changes (unchanged)  Recent Labs: No results found for requested labs within last 8760 hours.  No results found for requested labs within last 8760 hours.   CrCl cannot be calculated (Patient's most recent lab result is older than the maximum 21 days allowed.).   Wt Readings from Last 3 Encounters:  09/06/18 218 lb (98.9 kg)  05/30/18 210 lb (95.3 kg)  05/10/18 218 lb 9.6 oz (99.2 kg)     Other studies reviewed: Additional studies/records reviewed today include: summarized above  ASSESSMENT AND PLAN:  1. CAD involving the native coronary arteries status post CABG without angina: He is doing well without any symptoms concerning for angina.  Remains on dual antiplatelet therapy with aspirin and Plavix per primary cardiologist.  Though notes indicate there was discussion of possibly placing the patient on low-dose Xarelto given his vascular disease.  I will defer this to the patient's primary cardiologist.  Change from metoprolol to carvedilol as outlined below as well as changing pravastatin to high intensity statin given his LDL remains above goal.  Aggressive secondary prevention.  No plans for ischemic evaluation at this time.  2. HFrEF secondary to ICM: He appears well compensated and euvolemic.  Change Lopressor to Coreg in the setting of his cardiomyopathy and with sinus bradycardia.  Continue lisinopril.  Check echo to evaluate for improvement in LV systolic function following revascularization.  Optimize evidence-based heart failure therapy as indicated.  Pending results of repeat echo, consider addition of spironolactone.  If his EF  remains less than 35%, and following optimization of heart failure therapy, would recommend referral to EP for consideration of ICD.  3. HTN: Blood pressure is elevated today at 148/72.  However, he does note significant positional dizziness.  Thus, he will likely need a slightly more permissive blood pressure.  Stop Lopressor secondary to bradycardia and cardiomyopathy.  Start low-dose carvedilol 3.125 mg twice daily.  Continue lisinopril 20 mg daily.  4. Bradycardia: Stop metoprolol.  Start carvedilol as above.  Asymptomatic.  5. HLD: Most recent LDL of 81 from 05/2018.  Stop pravastatin and place patient on Crestor 20 mg daily with continuation of Zetia.  Goal LDL less than 70.  Recheck fasting lipid and liver function in approximately 8 weeks.  If LDL remains above goal at that time recommend escalation of Crestor to 40 mg daily as  long as he tolerates.  If patient does not tolerate Crestor recommend PCSK9 inhibitor as he is intolerant to Lipitor secondary to myalgias.  Disposition: F/u with Dr. Rockey Situ or an APP in 6 months.   Current medicines are reviewed at length with the patient today.  The patient did not have any concerns regarding medicines.  Signed, [name] Faith, PA-C 09/06/2018 9:45 AM     [address] [place], [place] [address] [phone]

## 2018-09-06 ENCOUNTER — Ambulatory Visit (INDEPENDENT_AMBULATORY_CARE_PROVIDER_SITE_OTHER): Payer: Medicare Other | Admitting: Physician Assistant

## 2018-09-06 ENCOUNTER — Encounter: Payer: Self-pay | Admitting: Physician Assistant

## 2018-09-06 VITALS — BP 148/72 | HR 50 | Ht 72.0 in | Wt 218.0 lb

## 2018-09-06 DIAGNOSIS — I2510 Atherosclerotic heart disease of native coronary artery without angina pectoris: Secondary | ICD-10-CM | POA: Diagnosis not present

## 2018-09-06 DIAGNOSIS — R001 Bradycardia, unspecified: Secondary | ICD-10-CM

## 2018-09-06 DIAGNOSIS — Z951 Presence of aortocoronary bypass graft: Secondary | ICD-10-CM

## 2018-09-06 DIAGNOSIS — I10 Essential (primary) hypertension: Secondary | ICD-10-CM

## 2018-09-06 DIAGNOSIS — E785 Hyperlipidemia, unspecified: Secondary | ICD-10-CM

## 2018-09-06 DIAGNOSIS — I255 Ischemic cardiomyopathy: Secondary | ICD-10-CM | POA: Diagnosis not present

## 2018-09-06 MED ORDER — CARVEDILOL 3.125 MG PO TABS: 3.1250 mg | ORAL_TABLET | Freq: Two times a day (BID) | ORAL | 3 refills | Status: DC

## 2018-09-06 MED ORDER — ROSUVASTATIN CALCIUM 20 MG PO TABS: 20.0000 mg | ORAL_TABLET | Freq: Every day | ORAL | 3 refills | Status: DC

## 2018-09-06 NOTE — Patient Instructions (Signed)
Medication Instructions:  Your physician has recommended you make the following change in your medication:  1- STOP Metoprolol 2- STOP Pravastatin 3- START Coreg Take 1 tablet (3.125 mg total) by mouth 2 (two) times daily with a meal 4- START Crestor Take 1 tablet (20 mg total) by mouth daily  If you need a refill on your cardiac medications before your next appointment, please call your pharmacy.   Lab work: Your physician recommends that you return to clinic for lab work in 8 weeks for (LFT, FASTING Lipid)   If you have labs (blood work) drawn today and your tests are completely normal, you will receive your results only by: Marland Kitchen MyChart Message (if you have MyChart) OR . A paper copy in the mail If you have any lab test that is abnormal or we need to change your treatment, we will call you to review the results.  Testing/Procedures: 1- Echo Echo  Please return to [facility] on ______________ at _______________ AM/PM for an Echocardiogram. Your physician has requested that you have an echocardiogram. Echocardiography is a painless test that uses sound waves to create images of your heart. It provides your doctor with information about the size and shape of your heart and how well your heart's chambers and valves are working. This procedure takes approximately one hour. There are no restrictions for this procedure. Please note; depending on visual quality an IV may need to be placed.    Follow-Up: At [facility], you and your health needs are our priority.  As part of our continuing mission to provide you with exceptional heart care, we have created designated Provider Care Teams.  These Care Teams include your primary Cardiologist (physician) and Advanced Practice Providers (APPs -  Physician Assistants and Nurse Practitioners) who all work together to provide you with the care you need, when you need it. You will need a follow up appointment in 6 months.  Please call our  office 2 months in advance to schedule this appointment.  You may see Dr. Rockey Situ or Christell Faith, PA-C

## 2018-09-30 ENCOUNTER — Other Ambulatory Visit: Payer: Self-pay | Admitting: Cardiovascular Disease

## 2018-10-04 ENCOUNTER — Other Ambulatory Visit: Payer: Self-pay

## 2018-10-05 ENCOUNTER — Other Ambulatory Visit: Payer: Self-pay

## 2018-11-02 ENCOUNTER — Telehealth: Payer: Self-pay | Admitting: Cardiovascular Disease

## 2018-11-02 DIAGNOSIS — I2510 Atherosclerotic heart disease of native coronary artery without angina pectoris: Secondary | ICD-10-CM

## 2018-11-02 NOTE — Telephone Encounter (Signed)
[facility] to [facility]. New order placed.   Echo appt confirmed for 4/27. Reviewed hospital procedures.

## 2018-11-02 NOTE — Telephone Encounter (Signed)
Patient calling  Will need his lipid and hep lab order [facility] to [facility] Will be going there Monday

## 2018-11-05 ENCOUNTER — Other Ambulatory Visit: Payer: Self-pay

## 2018-11-05 ENCOUNTER — Telehealth: Payer: Self-pay | Admitting: *Deleted

## 2018-11-05 ENCOUNTER — Other Ambulatory Visit
Admission: RE | Admit: 2018-11-05 | Discharge: 2018-11-05 | Disposition: A | Discharge status: Home / Self Care | Payer: Medicare Other | Attending: Cardiovascular Disease | Admitting: Cardiovascular Disease

## 2018-11-05 ENCOUNTER — Ambulatory Visit (INDEPENDENT_AMBULATORY_CARE_PROVIDER_SITE_OTHER): Payer: Medicare Other

## 2018-11-05 DIAGNOSIS — I255 Ischemic cardiomyopathy: Secondary | ICD-10-CM

## 2018-11-05 DIAGNOSIS — I2510 Atherosclerotic heart disease of native coronary artery without angina pectoris: Secondary | ICD-10-CM | POA: Insufficient documentation

## 2018-11-05 LAB — HEPATIC FUNCTION PANEL
ALT: 6 U/L (ref 0–44)
AST: 27 U/L (ref 15–41)
Albumin: 4.2 g/dL (ref 3.5–5.0)
Alkaline Phosphatase: 53 U/L (ref 38–126)
Bilirubin, Direct: <0.1 mg/dL (ref 0.0–0.2)
Total Bilirubin: 0.7 mg/dL (ref 0.3–1.2)
Total Protein: 7.6 g/dL (ref 6.5–8.1)

## 2018-11-05 LAB — LIPID PANEL
Cholesterol: 103 mg/dL (ref 0–200)
HDL: 42 mg/dL (ref >40)
Total CHOL/HDL Ratio: 2.5 RATIO
Triglycerides: 65 mg/dL (ref <150)
VLDL: 13 mg/dL (ref 0–40)

## 2018-11-05 MED ORDER — PERFLUTREN LIPID MICROSPHERE
1.0000 mL | INTRAVENOUS | Status: AC | PRN
  Administered 2018-11-05: 2 mL via INTRAVENOUS

## 2018-11-05 NOTE — Telephone Encounter (Signed)
Left a message for the patient to call back.  

## 2018-11-05 NOTE — Telephone Encounter (Signed)
-----   Message from Rise Mu, PA-C sent at 11/05/2018 10:57 AM EDT ----- Manual calculated LDL of 48. LFT normal.  Continue Crestor and Zetia.  Await echo.

## 2018-11-06 NOTE — Telephone Encounter (Signed)
Echo results are also available now.   Results called to pt. Pt verbalized understanding of lab work and echo.  Repeat echo in 1 year order entered.

## 2018-12-15 ENCOUNTER — Other Ambulatory Visit: Payer: Self-pay | Admitting: Cardiovascular Disease

## 2019-01-10 ENCOUNTER — Telehealth: Payer: Self-pay

## 2019-01-10 MED ORDER — ROSUVASTATIN CALCIUM 20 MG PO TABS: 20.0000 mg | ORAL_TABLET | Freq: Every day | ORAL | 0 refills | Status: DC

## 2019-01-10 MED ORDER — CLOPIDOGREL BISULFATE 75 MG PO TABS: 75.0000 mg | ORAL_TABLET | Freq: Every day | ORAL | 0 refills | Status: DC

## 2019-01-10 MED ORDER — EZETIMIBE 10 MG PO TABS: 10.0000 mg | ORAL_TABLET | Freq: Every day | ORAL | 0 refills | Status: DC

## 2019-01-10 MED ORDER — CARVEDILOL 3.125 MG PO TABS: 3.1250 mg | ORAL_TABLET | Freq: Two times a day (BID) | ORAL | 0 refills | Status: DC

## 2019-01-10 MED ORDER — LISINOPRIL 20 MG PO TABS: 20.0000 mg | ORAL_TABLET | Freq: Every day | ORAL | 0 refills | Status: DC

## 2019-01-10 NOTE — Telephone Encounter (Signed)
Requested Prescriptions   Signed Prescriptions Disp Refills  . ezetimibe (ZETIA) 10 MG tablet 90 tablet 0    Sig: Take 1 tablet (10 mg total) by mouth daily.    Authorizing Provider: Rise Mu    Ordering User: Raelene Bott, BRANDY L

## 2019-01-10 NOTE — Addendum Note (Signed)
Addended by: Raelene Bott, Najae Filsaime L on: 01/10/2019 11:45 AM   Modules accepted: Orders

## 2019-01-10 NOTE — Telephone Encounter (Signed)
Requested Prescriptions   Signed Prescriptions Disp Refills  . ezetimibe (ZETIA) 10 MG tablet 90 tablet 0    Sig: Take 1 tablet (10 mg total) by mouth daily.    Authorizing Provider: Rise Mu    Ordering User: Raelene Bott, Shacola Schussler L  . clopidogrel (PLAVIX) 75 MG tablet 90 tablet 0    Sig: Take 1 tablet (75 mg total) by mouth daily.    Authorizing Provider: Rise Mu    Ordering User: Raelene Bott, Myrna Vonseggern L  . rosuvastatin (CRESTOR) 20 MG tablet 90 tablet 0    Sig: Take 1 tablet (20 mg total) by mouth daily.    Authorizing Provider: Rise Mu    Ordering User: Raelene Bott, Ai Sonnenfeld L  . carvedilol (COREG) 3.125 MG tablet 180 tablet 0    Sig: Take 1 tablet (3.125 mg total) by mouth 2 (two) times daily with a meal.    Authorizing Provider: Rise Mu    Ordering User: NEWCOMER MCCLAIN, Hollie Bartus L  . lisinopril (ZESTRIL) 20 MG tablet 90 tablet 0    Sig: Take 1 tablet (20 mg total) by mouth daily.    Authorizing Provider: Rise Mu    Ordering User: Raelene Bott, Jackson Fetters L

## 2019-02-28 ENCOUNTER — Other Ambulatory Visit: Payer: Self-pay | Admitting: Physician Assistant

## 2019-03-08 ENCOUNTER — Telehealth: Payer: Self-pay | Admitting: Cardiovascular Disease

## 2019-03-08 NOTE — Telephone Encounter (Signed)
Please clarify how many teeth are being extracted. The request was marked as urgent. Please note the nature of urgency, ex. abscess teeth vs chipped tooth with significant pain?   Last PCI was in 03/2014, currently on aspirin and plavix.

## 2019-03-08 NOTE — Telephone Encounter (Signed)
Sent a message to staff members that are in office to fax the medical clearance to the requesting office. Informed that it was faxed to the requesting office office of [facility]. Tried call the office and it closed today at [place]. Will ttry to call the office on Monday to confirm if fax was received.

## 2019-03-08 NOTE — Telephone Encounter (Signed)
Called the requesting office and spoke with [place]. She stated that the patient is having 1 tooth extracted. The urgency is for tooth pain with sensitivity, along with gum line recession and to be added to a partial. She also asked how long would patient have to hold blood thinner's.

## 2019-03-08 NOTE — Telephone Encounter (Signed)
   Primary Cardiologist: Ida Rogue, MD  Chart reviewed as part of pre-operative protocol coverage. Simple dental extractions are considered low risk procedures per guidelines and generally do not require any specific cardiac clearance. It is also generally accepted that for simple extractions and dental cleanings, there is no need to interrupt blood thinner therapy. I have confirmed with requesting provider's office it is a single tooth extraction, therefore we recommend proceeding with procedure on current aspirin and plavix. We typically recommend holding plavix if it is 3 teeth extraction or more. Dental filling should be low risk as well.   SBE prophylaxis is not required for the patient.  I will route this recommendation to the requesting party via Epic fax function and remove from pre-op pool.  Please call with questions.  [address], [place] 03/08/2019, 1:26 PM

## 2019-03-08 NOTE — Telephone Encounter (Signed)
.  ° °  [facility] Pre-operative Risk Assessment    Request for surgical clearance:  1. What type of surgery is being performed? Extractions  2. When is this surgery scheduled? Urgent  3. What type of clearance is required (medical clearance vs. Pharmacy clearance to hold med vs. Both)? both  4. Are there any medications that need to be held prior to surgery and how long? Not noted   5. Practice name and name of physician performing surgery? [facility]    6. What is your office phone number not noted    7.   What is your office fax number [phone]   8.   Anesthesia type (None, local, MAC, general) ? Not noted    Clarisse Gouge 03/08/2019, 12:14 PM  _________________________________________________________________   (provider comments below)

## 2019-03-10 NOTE — Telephone Encounter (Signed)
I would agree Stay on asa and plavix

## 2019-03-11 NOTE — Telephone Encounter (Signed)
   Primary Cardiologist: Ida Rogue, MD  Chart reviewed as part of pre-operative protocol coverage. Simple dental extractions are considered low risk procedures per guidelines and generally do not require any specific cardiac clearance. It is also generally accepted that for simple extractions and dental cleanings, there is no need to interrupt blood thinner therapy. Per Dr. Rockey Situ, DO NOT hold ASA and Plavix for extraction. Theses cardiac medications should be continued.   SBE prophylaxis is not required for the patient.  I will route this recommendation to the requesting party via Epic fax function and remove from pre-op pool.  Please call with questions.  Lyda Jester, PA-C 03/11/2019, 9:26 AM

## 2019-06-23 IMAGING — MR MR LUMBAR SPINE W/O CM
4 of 5 series · 27 of 48 positions shown · non-contrast
Comparison: None.

CLINICAL DATA: Neurogenic claudication

EXAM:
MRI LUMBAR SPINE WITHOUT CONTRAST
TECHNIQUE: Multiplanar, multisequence MR imaging of the lumbar spine was
performed. No intravenous contrast was administered.

[Series 2: T2 · sagittal · 4.0mm · 0.81mm/px · 6 of 15 slices shown (1 of 2)]
[im 1/15]
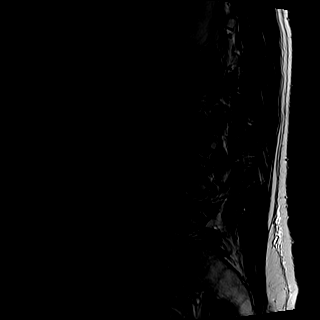
[im 3/15]
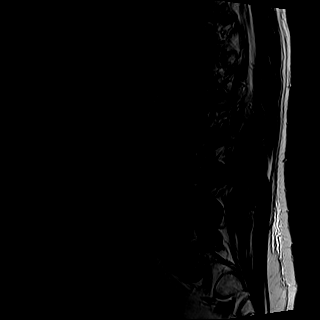
[im 6/15]
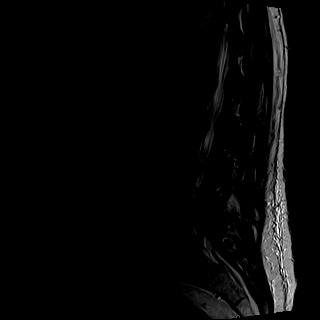
[im 9/15]
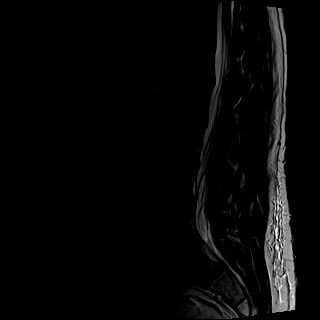
[im 12/15]
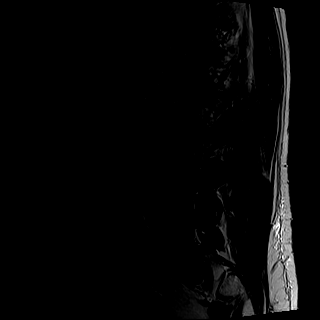
[im 15/15]
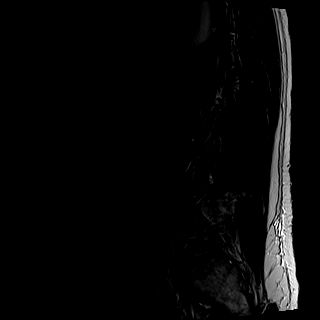

[Series 3: T1 · sagittal · 4.0mm · 0.41mm/px · 5 of 15 slices shown (1 of 2)]
[im 1/15]
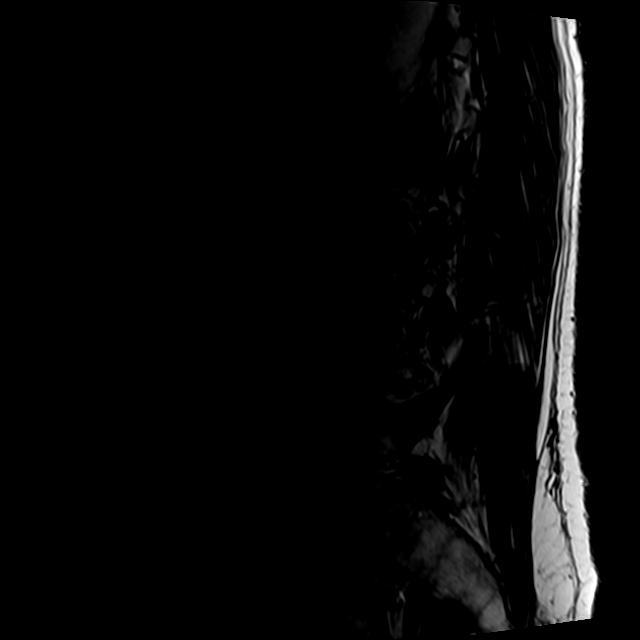
[im 4/15]
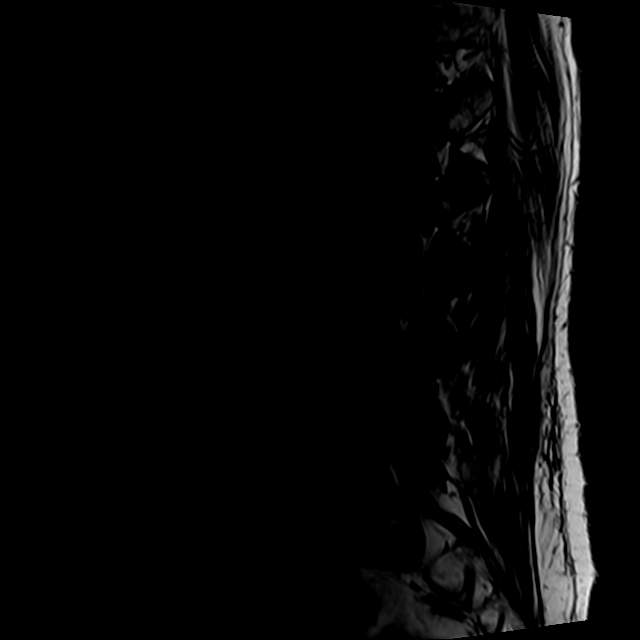
[im 8/15]
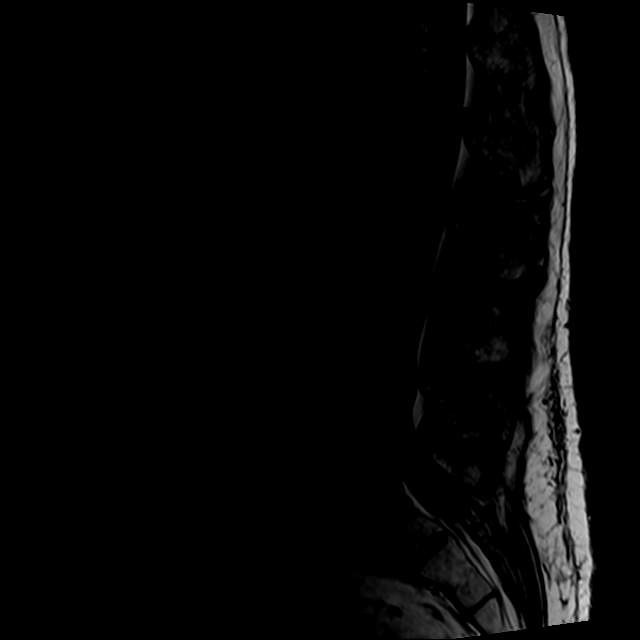
[im 11/15]
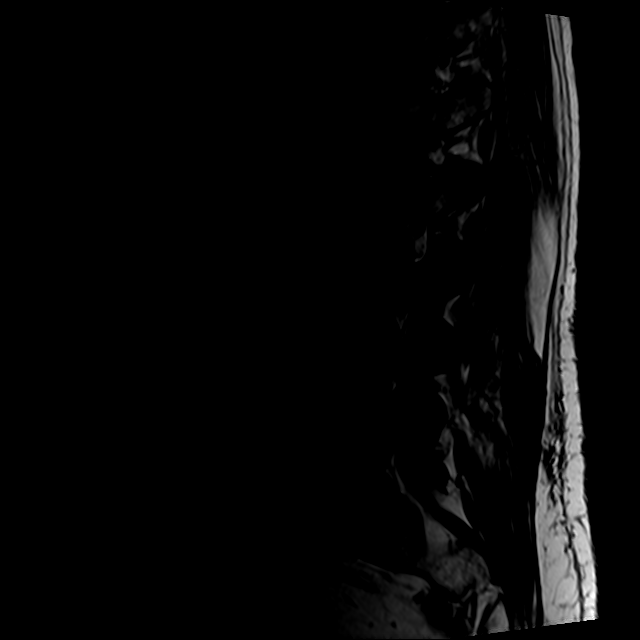
[im 15/15]
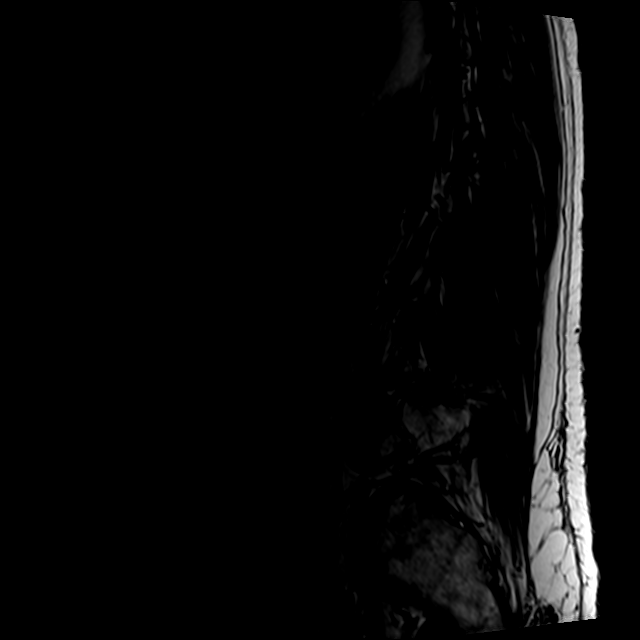

[Series 5: T2 · axial · 4.0mm · 0.78mm/px · z∈[-57,+151]mm · 10 of 43 slices shown (2 of 2)]
[im 3/43]
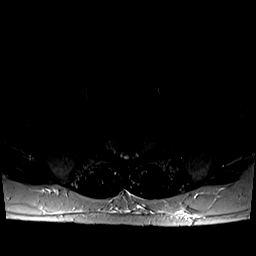
[im 6/43]
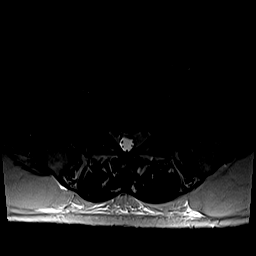
[im 9/43]
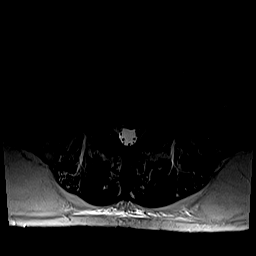
[im 15/43]
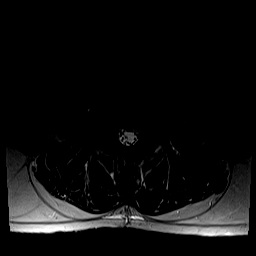
[im 20/43]
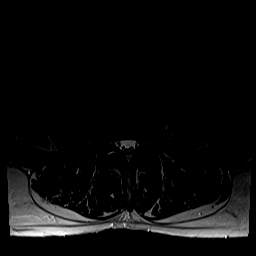
[im 23/43]
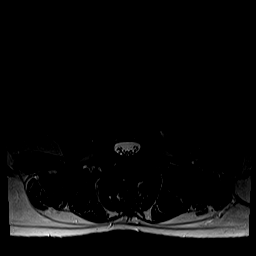
[im 26/43]
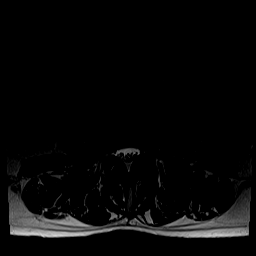
[im 31/43]
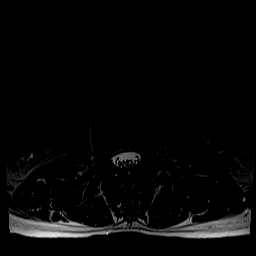
[im 37/43]
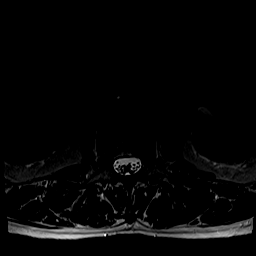
[im 43/43]
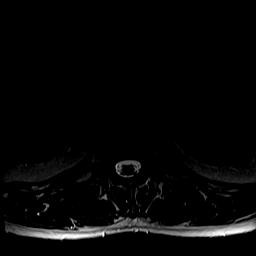

[Series 6: T1 · axial · 4.0mm · 0.39mm/px · z∈[-57,+122]mm · 6 of 43 slices shown (2 of 2)]
[im 3/43]
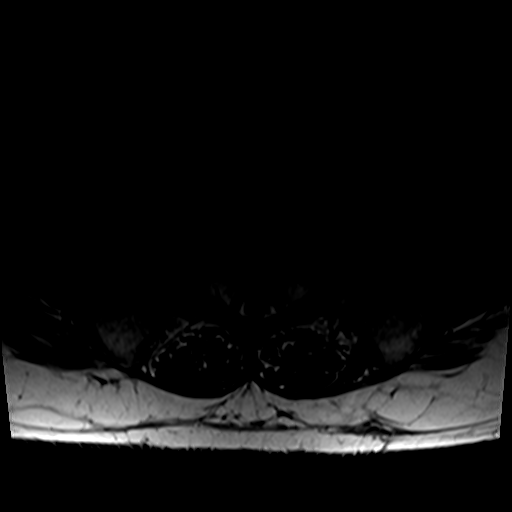
[im 6/43]
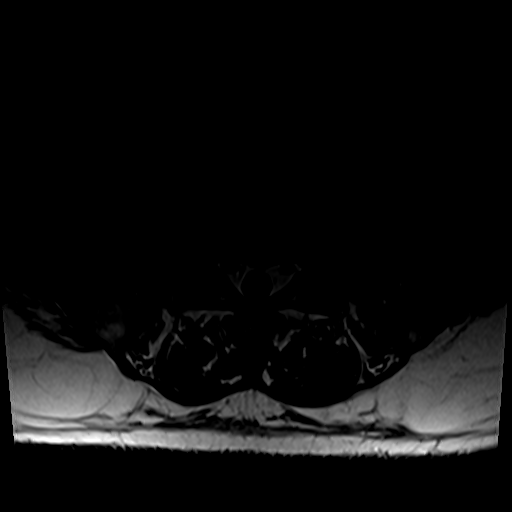
[im 9/43]
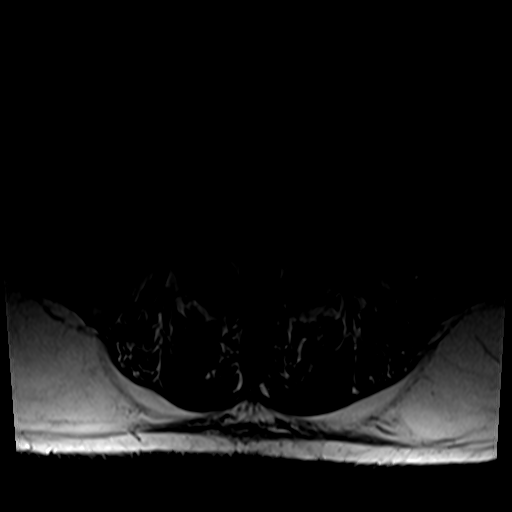
[im 15/43]
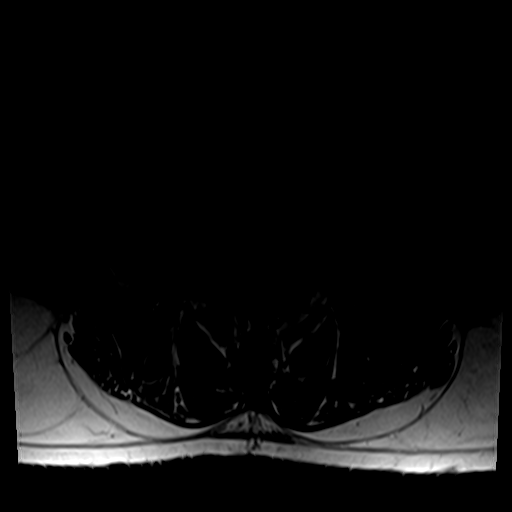
[im 23/43]
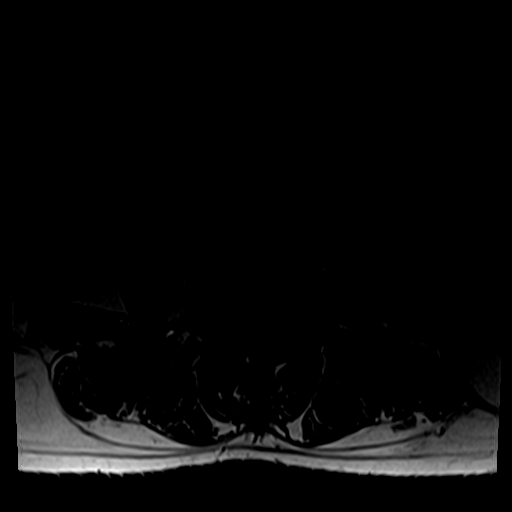
[im 37/43]
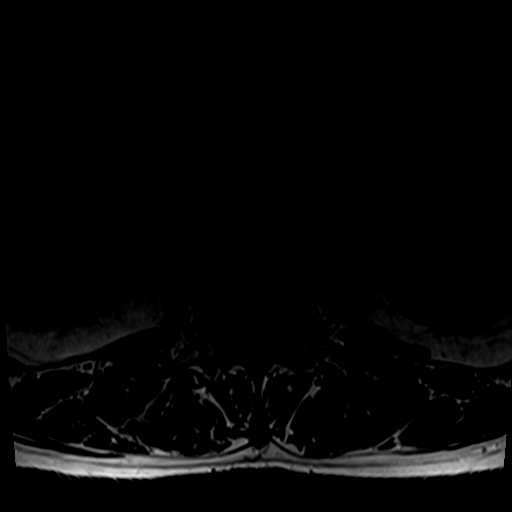

[27 of 48 positions shown; findings below may reference images not displayed]

FINDINGS: Segmentation:  Normal

Alignment:  Mild retrolisthesis L3-4

Vertebrae:  Negative for fracture or mass.

Conus medullaris and cauda equina: Conus extends to the L1-2 level.
Conus and cauda equina appear normal.

Paraspinal and other soft tissues: Negative

Disc levels:

L1-2: Mild disc degeneration

L2-3: Disc degeneration with diffuse disc bulging and mild facet
degeneration. No significant stenosis.

L3-4: Disc degeneration with diffuse disc bulging and endplate
spurring. Mild facet degeneration. No significant stenosis.

L4-5: Disc degeneration with diffuse disc bulging and endplate
spurring. Moderate facet and ligamentum flavum hypertrophy. Moderate
foraminal and subarticular stenosis on the right. Mild stenosis on
the left.

L5-S1: Negative
IMPRESSION: Multilevel degenerative change in the lumbar spine. Moderate
subarticular and foraminal stenosis on the right due to disc and
facet degeneration.

## 2019-08-01 ENCOUNTER — Ambulatory Visit: Payer: PRIVATE HEALTH INSURANCE | Attending: Internal Medicine

## 2019-08-01 DIAGNOSIS — Z23 Encounter for immunization: Secondary | ICD-10-CM

## 2019-08-01 NOTE — Progress Notes (Signed)
   Covid-19 Vaccination Clinic  Name:  [name] [name]    MRN: [id number] DOB: 11-15-1950  08/01/2019  [name] was observed post Covid-19 immunization for 30 minutes based on pre-vaccination screening without incidence. He was provided with Vaccine Information Sheet and instruction to access the V-Safe system.   [name] was instructed to call 911 with any severe reactions post vaccine: Marland Kitchen Difficulty breathing  . Swelling of your face and throat  . A fast heartbeat  . A bad rash all over your body  . Dizziness and weakness    Immunizations Administered    Name Date Dose VIS Date Route   Pfizer COVID-19 Vaccine 08/01/2019  1:18 PM 0.3 mL 06/21/2019 Intramuscular   Manufacturer: [place]   Lot: [id number]   [place]: [id number]

## 2019-08-06 ENCOUNTER — Other Ambulatory Visit (HOSPITAL_COMMUNITY): Payer: Self-pay | Admitting: Neurology

## 2019-08-06 ENCOUNTER — Other Ambulatory Visit: Payer: Self-pay | Admitting: Neurology

## 2019-08-06 DIAGNOSIS — R4189 Other symptoms and signs involving cognitive functions and awareness: Secondary | ICD-10-CM

## 2019-08-13 ENCOUNTER — Telehealth: Payer: Self-pay | Admitting: Cardiovascular Disease

## 2019-08-13 NOTE — Telephone Encounter (Signed)
   [facility] Pre-operative Risk Assessment    Request for surgical clearance:  1. What type of surgery is being performed? DENTAL EXTRACTION 2. When is surgery being performed? TBD  3. What type of clearance is required (medical clearance vs. Pharmacy clearance to hold med vs. Both)?  NOT LISTED  4. Are there any medications that need to be held prior to surgery and how long? NOT LISTED  5. Practice name and name of physician performing surgery?  [facility]  6. What is your office phone number [phone]   7.   What is your office fax number [phone]  8.   Anesthesia type (None, local, MAC, general) ? NOT LISTED  [name] [name] 08/13/2019, 2:47 PM  _________________________________________________________________   (provider comments below)

## 2019-08-14 NOTE — Telephone Encounter (Signed)
   Primary Cardiologist: Ida Rogue, MD  Chart reviewed as part of pre-operative protocol coverage. Simple dental extractions are considered low risk procedures per guidelines and generally do not require any specific cardiac clearance. It is also generally accepted that for simple extractions and dental cleanings, there is no need to interrupt blood thinner therapy.   SBE prophylaxis is not required for the patient.  I will route this recommendation to the requesting party via Epic fax function and remove from pre-op pool.  Please call with questions.  Darreld Mclean, PA-C 08/14/2019, 2:01 PM

## 2019-08-14 NOTE — Telephone Encounter (Signed)
I s/w dental office and confirmed the pt will be having only 1 tooth to be extracted. They will use local anesthesia. Pt is on Plavix, need to know if Plavix needs to be held. Pt is scheduled for 08/21/19 for his procedure .

## 2019-08-14 NOTE — Telephone Encounter (Signed)
Can we please call the dentist office and get more information? How many teeth need to be extracted? What medications would they like held (I am assuming Plavix but they didn't list anything)? What type of anesthesia?  Thank you!

## 2019-08-19 ENCOUNTER — Ambulatory Visit (HOSPITAL_COMMUNITY): Admission: RE | Admit: 2019-08-19 | Payer: PRIVATE HEALTH INSURANCE | Source: Ambulatory Visit

## 2019-08-22 ENCOUNTER — Ambulatory Visit: Payer: Medicare Other | Attending: Internal Medicine

## 2019-08-22 DIAGNOSIS — Z23 Encounter for immunization: Secondary | ICD-10-CM | POA: Insufficient documentation

## 2019-08-22 NOTE — Progress Notes (Signed)
   Covid-19 Vaccination Clinic  Name:  EMITT BURGET    MRN: [id number] DOB: [birth year]  08/22/2019  Mr. Jansma was observed post Covid-19 immunization for 15 minutes without incidence. He was provided with Vaccine Information Sheet and instruction to access the V-Safe system.   Mr. Reznikov was instructed to call 911 with any severe reactions post vaccine: Marland Kitchen Difficulty breathing  . Swelling of your face and throat  . A fast heartbeat  . A bad rash all over your body  . Dizziness and weakness    Immunizations Administered    Name Date Dose VIS Date Route   Pfizer COVID-19 Vaccine 08/22/2019  1:37 PM 0.3 mL 06/21/2019 Intramuscular   Manufacturer: [place]   Lot: [id number]   [place]: [id number]

## 2019-08-24 ENCOUNTER — Ambulatory Visit (HOSPITAL_COMMUNITY)
Admission: RE | Admit: 2019-08-24 | Discharge: 2019-08-24 | Disposition: A | Discharge status: Home / Self Care | Payer: Medicare Other | Source: Ambulatory Visit | Attending: Neurology | Admitting: Neurology

## 2019-08-24 ENCOUNTER — Other Ambulatory Visit: Payer: Self-pay

## 2019-08-24 DIAGNOSIS — R4189 Other symptoms and signs involving cognitive functions and awareness: Secondary | ICD-10-CM | POA: Insufficient documentation

## 2019-10-08 ENCOUNTER — Ambulatory Visit: Payer: Medicare Other | Admitting: Speech Pathology

## 2019-10-08 ENCOUNTER — Other Ambulatory Visit: Payer: Self-pay

## 2019-10-08 ENCOUNTER — Ambulatory Visit: Payer: Medicare Other | Attending: Neurology | Admitting: Physical Therapy

## 2019-10-08 DIAGNOSIS — R262 Difficulty in walking, not elsewhere classified: Secondary | ICD-10-CM

## 2019-10-08 DIAGNOSIS — R2689 Other abnormalities of gait and mobility: Secondary | ICD-10-CM | POA: Diagnosis present

## 2019-10-08 DIAGNOSIS — R490 Dysphonia: Secondary | ICD-10-CM | POA: Diagnosis present

## 2019-10-08 NOTE — Therapy (Signed)
[place] MAIN [facility] SERVICES [address] [place], [place], [address] Phone: [phone]   Fax:  [phone]  Physical Therapy Evaluation  Patient Details  Name: [name] [name] MRN: [id number] Date of Birth: 23-Jan-1951 Referring Provider (PT): Jennings Books   Encounter Date: 10/08/2019  PT End of Session - 10/08/19 1522    Visit Number  1    Number of Visits  17    Date for PT Re-Evaluation  11/12/19    PT Start Time  0317    PT Stop Time  0400    PT Time Calculation (min)  43 min    Equipment Utilized During Treatment  Gait belt    Activity Tolerance  Patient tolerated treatment well       Past Medical History:  Diagnosis Date  . Acute non-Q wave ST elevation myocardial infarction (STEMI) 9/13-14/2015  . Atherosclerotic heart disease of native coronary artery with unstable angina pectoris ([place])    a. 03/2014 NSTEMI/Cath: LM nl, LAD 108m (FFR 0.76), D1 18m, LCX 151m, RCA 170m, EF 35%  . Essential hypertension   . GERD (gastroesophageal reflux disease)   . Hyperlipidemia with target LDL less than 70   . Ischemic cardiomyopathy    a. 03/2014 EF 35% by LV gram.  . Peripheral vision loss    right eye   . 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 Tobacco abuse     Past Surgical History:  Procedure Laterality Date  . ADENOIDECTOMY    . CARDIAC CATHETERIZATION     a. 03/2014 NSTEMI/Cath: LM nl, LAD 28m (FFR 0.76), D1 113m, LCX 158m, RCA 118m, EF 35%  . COLONOSCOPY WITH PROPOFOL N/A 05/30/2018   Procedure: COLONOSCOPY WITH PROPOFOL;  Surgeon: Robert Bellow, MD;  Location: [facility] ENDOSCOPY;  Service: Endoscopy;  Laterality: N/A;  . CORONARY ARTERY BYPASS GRAFT N/A 03/28/2014   Procedure: CORONARY ARTERY BYPASS GRAFTING (CABG) x  4 using left internal mammary artery and bilateral saphenous leg vein.;  Surgeon: Rexene Alberts, MD;  Location: [place];  Service:  Open Heart Surgery;  Laterality: N/A;  . INTRAOPERATIVE TRANSESOPHAGEAL ECHOCARDIOGRAM N/A 03/28/2014   Procedure: INTRAOPERATIVE TRANSESOPHAGEAL ECHOCARDIOGRAM;  Surgeon: Rexene Alberts, MD;  Location: [place];  Service: Open Heart Surgery;  Laterality: N/A;    There were no vitals filed for this visit.   Subjective Assessment - 10/08/19 1532    Subjective  Patient feels weaker on the left side.    Pertinent History  Patient had bipass surgery and then the parkinsons began 2017. He has a resting tremmor. He does not use a walking device.He plays golf 2-3 times / week. He sometimes has difficulty with yard work, he has back pain that is intermittent.    How long can you sit comfortably?  unlimited    How long can you stand comfortably?  30 minutes    How long can you walk comfortably?  30 minutes level    Currently in Pain?  No/denies    Pain Score  0-No pain         [facility] PT Assessment - 10/08/19 1535      Assessment   Medical Diagnosis  parkinsons     Referring Provider (PT)  Jennings Books    Next MD Visit  08/20/19      Precautions   Precautions  None;Fall      Restrictions   Weight Bearing Restrictions  No      Balance Screen   Has the patient fallen in the past 6 months  Yes    How many times?  1    Has the patient had a decrease in activity level because of a fear of falling?   Yes    Is the patient reluctant to leave their home because of a fear of falling?   No      Home Environment   Living Environment  Private residence    Living Arrangements  Spouse/significant other    Available Help at Discharge  Family    Type of [place] to enter    Entrance Stairs-Number of Steps  2    Entrance Stairs-Rails  Right    Home Layout  One level    [place] bars - tub/shower;Hand held shower head      Prior Function   Level of Independence  Independent;Independent with basic ADLs;Independent with household mobility without device;Independent  with community mobility without device    Vocation  Retired    Leisure  golf 2-3 x week      Cognition   Overall Cognitive Status  Within Functional Limits for tasks assessed          POSTURE: WFL   PROM/AROM: WFL  STRENGTH:  Graded on a 0-5 scale Muscle Group Left Right                          Hip Flex 4/5 4/5  Hip Abd 4/5 4/5  Hip Add 4/5 4/5  Hip Ext 4/5 4/5      Knee Flex 5/5 5/5  Knee Ext 5/5 5/5  Ankle DF 5/5 5/5  Ankle PF 5/5 5/5   SENSATION: WFL    FUNCTIONAL MOBILITY: independent   BALANCE: Berg balance 54/56  GAIT: Patient ambulates without AD and minimal deviations and path deviation with head turns  And speed changes   OUTCOME MEASURES: TEST Outcome Interpretation  5 times sit<>stand 13.09sec >60 yo, >15 sec indicates increased risk for falls  10 meter walk test     1.47            m/s <1.0 m/s indicates increased risk for falls; limited community ambulator  Timed up and Go  9.15               sec <14 sec indicates increased risk for falls      Berg Balance Assessment 54/56 <36/56 (100% risk for falls), 37-45 (80% risk for falls); 46-51 (>50% risk for falls); 52-55 (lower risk <25% of falls)  9 Hole Peg Test L:    30.36            R: 28.28     Patient's Physical FS Primary Measure 63 Patient's intake functional measure is 63 out of 100 (higher number = greater function). Risk Adjusted Statistical FOTO* 50 Given the patient's risk-adjustment variables, like-patients nationally had a FS score of 50 at intake.        Objective measurements completed on examination: See above findings.              PT Education - 10/08/19 1522    Education Details  plan of care    Person(s) Educated  Patient    Methods  Explanation    Comprehension  Verbalized understanding  PT Long Term Goals - 10/08/19 1727      PT LONG TERM GOAL #1   Title  Patient will be independent in home exercise program to improve strength/mobility  for better functional independence with ADLs.    Time  2    Period  Weeks    Status  New    Target Date  10/22/19      PT LONG TERM GOAL #2   Title  Patient will increase Berg Balance score by > 6 points to demonstrate decreased fall risk during functional activities.    Time  4    Period  Weeks    Status  New    Target Date  11/12/19      PT LONG TERM GOAL #3   Title  Patient will be independent with ascend/descend 12 steps using single UE in step over step pattern without LOB.    Time  4    Period  Weeks    Status  New    Target Date  11/12/19      PT LONG TERM GOAL #4   Title  Patient will have no gait devialtions or path deviations during horizontal or vertical  head turns or gait speed changes.    Time  8    Period  Weeks    Status  New    Target Date  11/12/19             Plan - 10/08/19 1523    Clinical Impression Statement  Patient presents with minimal gait deviations with head turns, decreased balance, and decreased BLE strength. Patient's main complaint is mild balance deficits and inability to participate in desired activities. Patient wants to improve his balance.  Patient will benefit from skilled PT in order to improve gait quality, increase BLE strength, and improve dynamic standing balance to decrease risk for falls and enable patient to participate in desired activities.   Stability/Clinical Decision Making  Stable/Uncomplicated    Clinical Decision Making  Low    Rehab Potential  Good    PT Frequency  4x / week    PT Duration  4 weeks    PT Treatment/Interventions  Gait training;Therapeutic exercise;Therapeutic activities;Balance training;Neuromuscular re-education;Manual techniques    PT Next Visit Plan  LSVT BIG    PT Home Exercise Plan  LSVT BIG    Consulted and Agree with Plan of Care  Patient       Patient will benefit from skilled therapeutic intervention in order to improve the following deficits and impairments:  Abnormal gait, Decreased  activity tolerance, Decreased strength, Difficulty walking, Decreased balance, Decreased coordination, Decreased mobility, Impaired tone  Visit Diagnosis: Difficulty in walking, not elsewhere classified  Other abnormalities of gait and mobility     Problem List Patient Active Problem List   Diagnosis Date Noted  . Parkinson's disease ([place]) 04/06/2017  . 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 NSTEMI (non-ST elevated myocardial infarction) ([place]) 03/26/2014  . Acute myocardial infarction, subendocardial infarction ([place]) 03/26/2014  . Atherosclerotic heart disease of native coronary artery with unstable angina pectoris ([place])   . Ischemic cardiomyopathy   . Essential hypertension   . Hyperlipidemia with target LDL less than 70   . Tobacco abuse   . History of colonic polyps 02/26/2013    Alanson Puls, PT DPT 10/08/2019, 5:53 PM  [place] MAIN [facility] SERVICES [address] [place], [place], [address] Phone: [phone]   Fax:  [phone]  Name:  [name] [name] MRN: [id number] Date of Birth: 1950-10-30

## 2019-10-09 ENCOUNTER — Encounter: Payer: Self-pay | Admitting: Speech Pathology

## 2019-10-09 NOTE — Therapy (Signed)
[place] MAIN [facility] SERVICES [address] [place], [place], [address] Phone: [phone]   Fax:  [phone]  Speech Language Pathology Evaluation  Patient Details  Name: [name] [name] MRN: [id number] Date of Birth: 1950-11-27 Referring Provider (SLP): Dr. Manuella Ghazi   Encounter Date: 10/08/2019  End of Session - 10/09/19 1451    Visit Number  1    Number of Visits  17    Date for SLP Re-Evaluation  11/08/19    Authorization Type  Medicare    Authorization Time Period  Start 10/08/2019    Authorization - Visit Number  1    Authorization - Number of Visits  10    SLP Start Time  1600    SLP Stop Time   1700    SLP Time Calculation (min)  60 min    Activity Tolerance  Patient tolerated treatment well       Past Medical History:  Diagnosis Date  . Acute non-Q wave ST elevation myocardial infarction (STEMI) 9/13-14/2015  . Atherosclerotic heart disease of native coronary artery with unstable angina pectoris ([place])    a. 03/2014 NSTEMI/Cath: LM nl, LAD 1m (FFR 0.76), D1 171m, LCX 183m, RCA 145m, EF 35%  . Essential hypertension   . GERD (gastroesophageal reflux disease)   . Hyperlipidemia with target LDL less than 70   . Ischemic cardiomyopathy    a. 03/2014 EF 35% by LV gram.  . Peripheral vision loss    right eye   . 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 Tobacco abuse     Past Surgical History:  Procedure Laterality Date  . ADENOIDECTOMY    . CARDIAC CATHETERIZATION     a. 03/2014 NSTEMI/Cath: LM nl, LAD 17m (FFR 0.76), D1 178m, LCX 178m, RCA 125m, EF 35%  . COLONOSCOPY WITH PROPOFOL N/A 05/30/2018   Procedure: COLONOSCOPY WITH PROPOFOL;  Surgeon: Robert Bellow, MD;  Location: [facility] ENDOSCOPY;  Service: Endoscopy;  Laterality: N/A;  . CORONARY ARTERY BYPASS GRAFT N/A 03/28/2014   Procedure: CORONARY ARTERY BYPASS GRAFTING (CABG) x  4 using left  internal mammary artery and bilateral saphenous leg vein.;  Surgeon: Rexene Alberts, MD;  Location: [place];  Service: Open Heart Surgery;  Laterality: N/A;  . INTRAOPERATIVE TRANSESOPHAGEAL ECHOCARDIOGRAM N/A 03/28/2014   Procedure: INTRAOPERATIVE TRANSESOPHAGEAL ECHOCARDIOGRAM;  Surgeon: Rexene Alberts, MD;  Location: [place];  Service: Open Heart Surgery;  Laterality: N/A;    There were no vitals filed for this visit.      SLP Evaluation [facility] - 10/09/19 0001      SLP Visit Information   SLP Received On  10/08/19    Referring Provider (SLP)  Dr. Manuella Ghazi    Onset Date  08/04/2019    Medical Diagnosis  Parkinson's disease      Subjective   Subjective  "I listen more than I talk"    Patient/Family Stated Goal  Be understood      Pain Assessment   Currently in Pain?  No/denies      General Information   HPI  69 year old man diagnosed with Parkinson's disease.      Prior Functional Status   Cognitive/Linguistic Baseline  Within functional limits      Oral Motor/Sensory Function   Overall Oral Motor/Sensory Function  Appears within functional limits for tasks assessed  Motor Speech   Overall Motor Speech  Impaired    Respiration  Impaired    Level of Impairment  Conversation    Phonation  Hoarse;Low vocal intensity    Resonance  Within functional limits    Articulation  Within functional limitis    Intelligibility  Intelligible    Phonation  Impaired    Volume  Soft    Pitch  Low      Standardized Assessments   Standardized Assessments   Other Assessment   LSVT-LOUD Voice Evaluation      LSVT-LOUD Voice Evaluation Maximum phonation time for sustained "ah": 9 seconds Mean intensity during sustained "ah": 72 dB  Mean intensity sustained during conversational speech: 74 dB Highest dynamic pitch when altering pitch from a low note to a high note: 190 Hz Lowest dynamic pitch when altering from a high note to a low note: 130 Hz Mean intensity during conversational  monologue: 74 dB Mean intensity during word fluency task: 67 dB Mean intensity during while completing motor simultaneously with conversation: 62 dB Stimulability: Improved vocal quality with loud voice (85 dB) for sustain vowel, maximum high and low phonation, and functional phrases.  Improved pitch range with model.  Patient reports "feel like I'm screaming".  SLP Education - 10/09/19 1450    Education Details  LSVT-LOUD    Person(s) Educated  Patient    Methods  Explanation    Comprehension  Verbalized understanding         SLP Long Term Goals - 10/09/19 1453      SLP LONG TERM GOAL #1   Title  The patient will complete Daily Tasks (Maximum duration "ah", High/Lows, and Functional Phrases) at average loudness of 80 dB and with loud, good quality voice.    Time  4    Period  Weeks    Status  New    Target Date  11/08/19      SLP LONG TERM GOAL #2   Title  The patient will complete Hierarchal Speech Loudness reading drills (words/phrases, sentences, and paragraph) at average 75 dB and with loud, good quality voice.    Time  4    Period  Weeks    Status  New    Target Date  11/08/19      SLP LONG TERM GOAL #3   Title  The patient will participate in conversation, maintaining average loudness of 75 dB and loud, good quality voice.    Time  4    Period  Days    Status  New    Target Date  11/08/19      SLP LONG TERM GOAL #4   Title  The patient will complete homework daily.    Time  4    Period  Weeks    Status  New    Target Date  11/08/19       Plan - 10/09/19 1452    Clinical Impression Statement  This 69 year old man diagnosed with Parkinson's disease is presenting with mild-moderate voice disorder characterized by hoarse vocal quality, hypophonia, and monotone voice.  Based on stimulability testing, the patient is judged to be a good candidate for the LSVT LOUD program.  It is recommended that the patient receive the LSVT LOUD program which is comprised of 16  intensive sessions (4 times per week for 4 weeks, one hour sessions).  Prognosis for improvement is good based on motivation,    Speech Therapy Frequency  4x / week    Duration  4 weeks    Treatment/Interventions  SLP instruction and feedback;Patient/family education    Potential to Achieve Goals  Good    Potential Considerations  Ability to learn/carryover information;Previous level of function;Co-morbidities;Severity of impairments;Cooperation/participation level;Medical prognosis;Family/community support    SLP Home Exercise Plan  LSVT-LOUD Daily Homework    Consulted and Agree with Plan of Care  Patient       Patient will benefit from skilled therapeutic intervention in order to improve the following deficits and impairments:   Dysphonia - Plan: SLP plan of care cert/re-cert    Problem List Patient Active Problem List   Diagnosis Date Noted  . Parkinson's disease ([place]) 04/06/2017  . 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 NSTEMI (non-ST elevated myocardial infarction) ([place]) 03/26/2014  . Acute myocardial infarction, subendocardial infarction ([place]) 03/26/2014  . Atherosclerotic heart disease of native coronary artery with unstable angina pectoris ([place])   . Ischemic cardiomyopathy   . Essential hypertension   . Hyperlipidemia with target LDL less than 70   . Tobacco abuse   . History of colonic polyps 02/26/2013   [name] Sea, MS/CCC- SLP  Lou Miner 10/09/2019, 2:59 PM  [place] MAIN [facility] SERVICES [address] [place], [place], [address] Phone: [phone]   Fax:  [phone]  Name: ABDULAZEEZ MCPHEARSON MRN: [id number] Date of Birth: [birth year]

## 2019-10-14 ENCOUNTER — Ambulatory Visit: Payer: Medicare Other | Attending: Neurology | Admitting: Speech Pathology

## 2019-10-14 ENCOUNTER — Ambulatory Visit: Payer: Medicare Other | Admitting: Physical Therapy

## 2019-10-14 ENCOUNTER — Other Ambulatory Visit: Payer: Self-pay

## 2019-10-14 DIAGNOSIS — R2689 Other abnormalities of gait and mobility: Secondary | ICD-10-CM | POA: Insufficient documentation

## 2019-10-14 DIAGNOSIS — R491 Aphonia: Secondary | ICD-10-CM | POA: Insufficient documentation

## 2019-10-14 DIAGNOSIS — R262 Difficulty in walking, not elsewhere classified: Secondary | ICD-10-CM | POA: Insufficient documentation

## 2019-10-14 DIAGNOSIS — R490 Dysphonia: Secondary | ICD-10-CM

## 2019-10-15 ENCOUNTER — Ambulatory Visit: Payer: Medicare Other | Admitting: Physical Therapy

## 2019-10-15 ENCOUNTER — Ambulatory Visit: Payer: Medicare Other | Admitting: Speech Pathology

## 2019-10-15 ENCOUNTER — Encounter: Payer: Self-pay | Admitting: Speech Pathology

## 2019-10-15 ENCOUNTER — Encounter: Payer: Self-pay | Admitting: Physical Therapy

## 2019-10-15 ENCOUNTER — Other Ambulatory Visit: Payer: Self-pay

## 2019-10-15 DIAGNOSIS — R262 Difficulty in walking, not elsewhere classified: Secondary | ICD-10-CM

## 2019-10-15 DIAGNOSIS — R490 Dysphonia: Secondary | ICD-10-CM | POA: Diagnosis not present

## 2019-10-15 DIAGNOSIS — R2689 Other abnormalities of gait and mobility: Secondary | ICD-10-CM

## 2019-10-15 NOTE — Therapy (Signed)
[place] MAIN [facility] SERVICES [address] [place], [place], [address] Phone: [phone]   Fax:  [phone]  Speech Language Pathology Treatment  Patient Details  Name: [name] [name] MRN: [id number] Date of Birth: Aug 01, 1950 Referring Provider (SLP): Dr. Manuella Ghazi   Encounter Date: 10/14/2019  End of Session - 10/15/19 1110    Visit Number  2    Number of Visits  17    Date for SLP Re-Evaluation  11/08/19    Authorization Type  Medicare    Authorization Time Period  Start 10/08/2019    Authorization - Visit Number  2    Authorization - Number of Visits  10    SLP Start Time  1600    SLP Stop Time   1700    SLP Time Calculation (min)  60 min    Activity Tolerance  Patient tolerated treatment well       Past Medical History:  Diagnosis Date  . Acute non-Q wave ST elevation myocardial infarction (STEMI) 9/13-14/2015  . Atherosclerotic heart disease of native coronary artery with unstable angina pectoris ([place])    a. 03/2014 NSTEMI/Cath: LM nl, LAD 22m (FFR 0.76), D1 126m, LCX 136m, RCA 186m, EF 35%  . Essential hypertension   . GERD (gastroesophageal reflux disease)   . Hyperlipidemia with target LDL less than 70   . Ischemic cardiomyopathy    a. 03/2014 EF 35% by LV gram.  . Peripheral vision loss    right eye   . 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 Tobacco abuse     Past Surgical History:  Procedure Laterality Date  . ADENOIDECTOMY    . CARDIAC CATHETERIZATION     a. 03/2014 NSTEMI/Cath: LM nl, LAD 47m (FFR 0.76), D1 180m, LCX 159m, RCA 144m, EF 35%  . COLONOSCOPY WITH PROPOFOL N/A 05/30/2018   Procedure: COLONOSCOPY WITH PROPOFOL;  Surgeon: Robert Bellow, MD;  Location: [facility] ENDOSCOPY;  Service: Endoscopy;  Laterality: N/A;  . CORONARY ARTERY BYPASS GRAFT N/A 03/28/2014   Procedure: CORONARY ARTERY BYPASS GRAFTING (CABG) x  4 using left  internal mammary artery and bilateral saphenous leg vein.;  Surgeon: Rexene Alberts, MD;  Location: [place];  Service: Open Heart Surgery;  Laterality: N/A;  . INTRAOPERATIVE TRANSESOPHAGEAL ECHOCARDIOGRAM N/A 03/28/2014   Procedure: INTRAOPERATIVE TRANSESOPHAGEAL ECHOCARDIOGRAM;  Surgeon: Rexene Alberts, MD;  Location: [place];  Service: Open Heart Surgery;  Laterality: N/A;    There were no vitals filed for this visit.  Subjective Assessment - 10/15/19 1109    Subjective  Pt engaged and working hard            ADULT SLP TREATMENT - 10/15/19 0001      General Information   Behavior/Cognition  Alert;Cooperative;Pleasant mood    HPI  Parkinson's diease      Treatment Provided   Treatment provided  Cognitive-Linquistic      Pain Assessment   Pain Assessment  No/denies pain      Cognitive-Linquistic Treatment   Treatment focused on  Voice    Skilled Treatment  Daily Task #1 (Maximum sustained "ah"): Average 6 seconds, 85 dB. Daily Task 2 (Maximum fundamental frequency range): Highs: 15 high pitched "ah" (270 Hz). Lows: 15 low pitched "ah" (175). Daily task #3 (Maximum speech loudness drill of functional phrases): Average 75 dB.  Hierarchal speech  loudness drill: Read word, 75 dB. Homework: assignments given.  Off the cuff remarks: average 65 dB.        Assessment / Recommendations / Plan   Plan  Continue with current plan of care      Progression Toward Goals   Progression toward goals  Progressing toward goals       SLP Education - 10/15/19 1110    Education Details  LSVT-LOUD, effort required to sound "normal"    Person(s) Educated  Patient    Methods  Explanation    Comprehension  Verbalized understanding         SLP Long Term Goals - 10/09/19 1453      SLP LONG TERM GOAL #1   Title  The patient will complete Daily Tasks (Maximum duration "ah", High/Lows, and Functional Phrases) at average loudness of 80 dB and with loud, good quality voice.    Time  4    Period   Weeks    Status  New    Target Date  11/08/19      SLP LONG TERM GOAL #2   Title  The patient will complete Hierarchal Speech Loudness reading drills (words/phrases, sentences, and paragraph) at average 75 dB and with loud, good quality voice.    Time  4    Period  Weeks    Status  New    Target Date  11/08/19      SLP LONG TERM GOAL #3   Title  The patient will participate in conversation, maintaining average loudness of 75 dB and loud, good quality voice.    Time  4    Period  Days    Status  New    Target Date  11/08/19      SLP LONG TERM GOAL #4   Title  The patient will complete homework daily.    Time  4    Period  Weeks    Status  New    Target Date  11/08/19       Plan - 10/15/19 1111    Clinical Impression Statement  The patient is completing daily tasks and hierarchal speech drill tasks with loud, good quality voice with minimal shaping required. His off the cuff remarks remain soft.    Speech Therapy Frequency  4x / week    Duration  4 weeks    Treatment/Interventions  SLP instruction and feedback;Patient/family education    Potential to Achieve Goals  Good    Potential Considerations  Ability to learn/carryover information;Previous level of function;Co-morbidities;Severity of impairments;Cooperation/participation level;Medical prognosis;Family/community support    SLP Home Exercise Plan  LSVT-LOUD Daily Homework    Consulted and Agree with Plan of Care  Patient       Patient will benefit from skilled therapeutic intervention in order to improve the following deficits and impairments:   Dysphonia    Problem List Patient Active Problem List   Diagnosis Date Noted  . Parkinson's disease ([place]) 04/06/2017  . 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 NSTEMI (non-ST elevated myocardial infarction) ([place]) 03/26/2014  . Acute myocardial infarction, subendocardial infarction ([place]) 03/26/2014  . Atherosclerotic heart disease of native coronary artery with unstable angina pectoris  ([place])   . Ischemic cardiomyopathy   . Essential hypertension   . Hyperlipidemia with target LDL less than 70   . Tobacco abuse   . History of colonic polyps 02/26/2013   Leroy Sea, MS/CCC- SLP  Lou Miner 10/15/2019, 11:12 AM  [place]  [place], [place], [address] Phone: [phone]   Fax:  [phone]   Name: ABRIAN SAIKI MRN: [id number] Date of Birth: 04-04-1951

## 2019-10-15 NOTE — Therapy (Addendum)
[place] MAIN [facility] SERVICES [address] [place], [place], [address] Phone: [phone]   Fax:  [phone]  Physical Therapy Treatment  Patient Details  Name: [name] [name] MRN: [id number] Date of Birth: September 04, 1950 Referring Provider (PT): Jennings Books   Encounter Date: 10/15/2019  PT End of Session - 10/15/19 1543    Visit Number  2    Number of Visits  17    Date for PT Re-Evaluation  11/12/19    PT Start Time  0300    PT Stop Time  0355    PT Time Calculation (min)  55 min    Equipment Utilized During Treatment  Gait belt    Activity Tolerance  Patient tolerated treatment well    Behavior During Therapy  [facility] for tasks assessed/performed       Past Medical History:  Diagnosis Date  . Acute non-Q wave ST elevation myocardial infarction (STEMI) 9/13-14/2015  . Atherosclerotic heart disease of native coronary artery with unstable angina pectoris ([place])    a. 03/2014 NSTEMI/Cath: LM nl, LAD 55m (FFR 0.76), D1 153m, LCX 142m, RCA 119m, EF 35%  . Essential hypertension   . GERD (gastroesophageal reflux disease)   . Hyperlipidemia with target LDL less than 70   . Ischemic cardiomyopathy    a. 03/2014 EF 35% by LV gram.  . Peripheral vision loss    right eye   . 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 Tobacco abuse     Past Surgical History:  Procedure Laterality Date  . ADENOIDECTOMY    . CARDIAC CATHETERIZATION     a. 03/2014 NSTEMI/Cath: LM nl, LAD 16m (FFR 0.76), D1 16m, LCX 138m, RCA 14m, EF 35%  . COLONOSCOPY WITH PROPOFOL N/A 05/30/2018   Procedure: COLONOSCOPY WITH PROPOFOL;  Surgeon: Robert Bellow, MD;  Location: [facility] ENDOSCOPY;  Service: Endoscopy;  Laterality: N/A;  . CORONARY ARTERY BYPASS GRAFT N/A 03/28/2014   Procedure: CORONARY ARTERY BYPASS GRAFTING (CABG) x  4 using left internal mammary artery and bilateral saphenous leg vein.;   Surgeon: Rexene Alberts, MD;  Location: [place];  Service: Open Heart Surgery;  Laterality: N/A;  . INTRAOPERATIVE TRANSESOPHAGEAL ECHOCARDIOGRAM N/A 03/28/2014   Procedure: INTRAOPERATIVE TRANSESOPHAGEAL ECHOCARDIOGRAM;  Surgeon: Rexene Alberts, MD;  Location: [place];  Service: Open Heart Surgery;  Laterality: N/A;    There were no vitals filed for this visit.  Subjective Assessment - 10/15/19 1542    Subjective  Patient feels weaker on the left side.    Pertinent History  Patient had bipass surgery and then the parkinsons began 2017. He has a resting tremmor. He does not use a walking device.He plays golf 2-3 times / week. He sometimes has difficulty with yard work, he has back pain that is intermittent.    How long can you sit comfortably?  unlimited    How long can you stand comfortably?  30 minutes    How long can you walk comfortably?  30 minutes level    Currently in Pain?  No/denies    Pain Score  0-No pain          Treatment:   Patient seen for LSVT Daily Session Maximal Daily Exercises for facilitation/coordination of movement Sustained movements are designed to rescale the amplitude of movement output for generalization to daily functional activities .Performed as  follows for 1 set of 10 repetitions each multidirectional sustained movements  1) Floor to ceiling , cues to hold for 10 seconds,needs modeling for correct positions , VC to reach out further and to reach up to the ceiling 2) Side to side multidirectional Repetitive movements performed in sitting and are designed to provide retraining effort needed for sustained muscle activation in tasks , cues to lean fwd and hold position x 10 counts: Patient does not move fwd<>sidesitting<> to fwd and is not able to transition or stretch out leg very far 3) Step and reach forward , cues for good knee flex, cues to move UE and LE together, cues to rotate  arms up to pronate his forearms 4) Step and reach backwards , cues for BUE back,  getting toe up and bending back knee 5) Step and reach sideways, cues to turn  head sideways, and turn head back to neutral, and to rotate arms and pronate forearms 6) Rock and reach forward/backward , cues to reach far fwd with UE's, patient not comfortable with feet apart , not rocking very far to get a good weight shift, not able to raise  heel or raise  toes much 7) Rock and reach sideways, cues for twist and look behind , only rotates side ways, unable to twist around or turn to look behind   functional component task with supervision 5 reps and simulated activities for: 1. Sit to stand x 10   needs cues to begin with UE up for correct start position 2. 1/2 foam tapping, flat side up , flat side down  x 5 ,   for being able to ascend and descend a curb safely. 3.purdue peg board for finger coordination ,to be able to manage golf peg for golf b 4.stepping over 1/2 foam fwd/bwd x 15 ,   for being able to ascend and descend a curb safely. 5. Not  Completed due to time constraints  Patient performed with instruction, verbal cues, tactile cues of therapist: goal: increase tissue extensibility, promote proper posture, improve mobility  Pt educated throughout session about proper posture and technique with exercises. Improved exercise technique, movement at target joints, use of target muscles after min to mod verbal, visual, tactile cues.                       PT Education - 10/15/19 1543    Education Details  LSVT BIG    Person(s) Educated  Patient    Methods  Explanation    Comprehension  Verbalized understanding;Returned demonstration;Tactile cues required;Verbal cues required;Need further instruction          PT Long Term Goals - 10/08/19 1727      PT LONG TERM GOAL #1   Title  Patient will be independent in home exercise program to improve strength/mobility for better functional independence with ADLs.    Time  2    Period  Weeks    Status  New    Target Date   10/22/19      PT LONG TERM GOAL #2   Title  Patient will increase Berg Balance score by > 6 points to demonstrate decreased fall risk during functional activities.    Time  4    Period  Weeks    Status  New    Target Date  11/12/19      PT LONG TERM GOAL #3   Title  Patient will be independent with ascend/descend 12 steps using single  UE in step over step pattern without LOB.    Time  4    Period  Weeks    Status  New    Target Date  11/12/19      PT LONG TERM GOAL #4   Title  Patient will have no gait devialtions or path deviations during horizontal or vertical  head turns or gait speed changes.    Time  8    Period  Weeks    Status  New    Target Date  11/12/19            Plan - 10/15/19 1543    Clinical Impression Statement  Max cueing needed to appropriately perform LSVT tasks with leg, hand, and head position. Decreased coordination demonstrated requiring consistent verbal cueing to correct form. Cognitive understanding of task was delayed. Patient continues to demonstrate some in coordination of movement with select exercises such as rock and reach and stepping backwards. Patient responds well to verbal and tactile cues to correct form and technique.  CGA to SBA for safety with activities.  Uses to increase intensity and amplitude of movements throughout session    Stability/Clinical Decision Making  Stable/Uncomplicated    Rehab Potential  Good    PT Frequency  4x / week    PT Duration  4 weeks    PT Treatment/Interventions  Gait training;Therapeutic exercise;Therapeutic activities;Balance training;Neuromuscular re-education;Manual techniques    PT Next Visit Plan  LSVT BIG    PT Home Exercise Plan  LSVT BIG    Consulted and Agree with Plan of Care  Patient       Patient will benefit from skilled therapeutic intervention in order to improve the following deficits and impairments:  Abnormal gait, Decreased activity tolerance, Decreased strength, Difficulty walking,  Decreased balance, Decreased coordination, Decreased mobility, Impaired tone  Visit Diagnosis: Other abnormalities of gait and mobility  Difficulty in walking, not elsewhere classified     Problem List Patient Active Problem List   Diagnosis Date Noted  . Parkinson's disease ([place]) 04/06/2017  . 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 NSTEMI (non-ST elevated myocardial infarction) ([place]) 03/26/2014  . Acute myocardial infarction, subendocardial infarction ([place]) 03/26/2014  . Atherosclerotic heart disease of native coronary artery with unstable angina pectoris ([place])   . Ischemic cardiomyopathy   . Essential hypertension   . Hyperlipidemia with target LDL less than 70   . Tobacco abuse   . History of colonic polyps 02/26/2013    Alanson Puls, [place] DPT 10/15/2019, 3:44 PM  [place] MAIN [facility] SERVICES [address] [place], [place], [address] Phone: [phone]   Fax:  [phone]  Name: VONTEZ QUEBEDEAUX MRN: [id number] Date of Birth: [birth year]

## 2019-10-16 ENCOUNTER — Ambulatory Visit: Payer: Medicare Other | Admitting: Speech Pathology

## 2019-10-16 ENCOUNTER — Encounter: Payer: Self-pay | Admitting: Speech Pathology

## 2019-10-16 ENCOUNTER — Encounter: Payer: Self-pay | Admitting: Physical Therapy

## 2019-10-16 ENCOUNTER — Ambulatory Visit: Payer: Medicare Other | Admitting: Physical Therapy

## 2019-10-16 ENCOUNTER — Other Ambulatory Visit: Payer: Self-pay

## 2019-10-16 DIAGNOSIS — R262 Difficulty in walking, not elsewhere classified: Secondary | ICD-10-CM

## 2019-10-16 DIAGNOSIS — R490 Dysphonia: Secondary | ICD-10-CM

## 2019-10-16 DIAGNOSIS — R2689 Other abnormalities of gait and mobility: Secondary | ICD-10-CM

## 2019-10-16 NOTE — Therapy (Addendum)
[place] MAIN [facility] SERVICES [address] [place], [place], [address] Phone: [phone]   Fax:  [phone]  Physical Therapy Treatment  Patient Details  Name: [name] [name] MRN: [id number] Date of Birth: 1951/03/20 Referring Provider (PT): Jennings Books   Encounter Date: 10/16/2019  PT End of Session - 10/16/19 1459    Visit Number  3    Number of Visits  17    Date for PT Re-Evaluation  11/12/19    PT Start Time  0250    PT Stop Time  0345    PT Time Calculation (min)  55 min    Equipment Utilized During Treatment  Gait belt    Activity Tolerance  Patient tolerated treatment well    Behavior During Therapy  WFL for tasks assessed/performed       Past Medical History:  Diagnosis Date  . Acute non-Q wave ST elevation myocardial infarction (STEMI) 9/13-14/2015  . Atherosclerotic heart disease of native coronary artery with unstable angina pectoris ([place])    a. 03/2014 NSTEMI/Cath: LM nl, LAD 66m (FFR 0.76), D1 164m, LCX 136m, RCA 140m, EF 35%  . Essential hypertension   . GERD (gastroesophageal reflux disease)   . Hyperlipidemia with target LDL less than 70   . Ischemic cardiomyopathy    a. 03/2014 EF 35% by LV gram.  . Peripheral vision loss    right eye   . 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 Tobacco abuse     Past Surgical History:  Procedure Laterality Date  . ADENOIDECTOMY    . CARDIAC CATHETERIZATION     a. 03/2014 NSTEMI/Cath: LM nl, LAD 71m (FFR 0.76), D1 161m, LCX 171m, RCA 136m, EF 35%  . COLONOSCOPY WITH PROPOFOL N/A 05/30/2018   Procedure: COLONOSCOPY WITH PROPOFOL;  Surgeon: Robert Bellow, MD;  Location: [facility] ENDOSCOPY;  Service: Endoscopy;  Laterality: N/A;  . CORONARY ARTERY BYPASS GRAFT N/A 03/28/2014   Procedure: CORONARY ARTERY BYPASS GRAFTING (CABG) x  4 using left internal mammary artery and bilateral saphenous leg vein.;   Surgeon: Rexene Alberts, MD;  Location: [place];  Service: Open Heart Surgery;  Laterality: N/A;  . INTRAOPERATIVE TRANSESOPHAGEAL ECHOCARDIOGRAM N/A 03/28/2014   Procedure: INTRAOPERATIVE TRANSESOPHAGEAL ECHOCARDIOGRAM;  Surgeon: Rexene Alberts, MD;  Location: [place];  Service: Open Heart Surgery;  Laterality: N/A;    There were no vitals filed for this visit.  Subjective Assessment - 10/16/19 1458    Subjective  Patient feels weaker on the left side.He tried to do his exercises last night, he is sore.    Pertinent History  Patient had bipass surgery and then the parkinsons began 2017. He has a resting tremmor. He does not use a walking device.He plays golf 2-3 times / week. He sometimes has difficulty with yard work, he has back pain that is intermittent.    How long can you sit comfortably?  unlimited    How long can you stand comfortably?  30 minutes    How long can you walk comfortably?  30 minutes level    Currently in Pain?  No/denies    Pain Score  0-No pain        Treatment:   Patient seen for LSVT Daily Session Maximal Daily Exercises for facilitation/coordination of movement Sustained movements are designed to rescale the amplitude of movement output  for generalization to daily functional activities .Performed as follows for 1 set of 10 repetitions each multidirectional sustained movements  1) Floor to ceiling , cues to hold for 10 seconds,needs modeling for correct positions , VC to reach out further and to reach up to the ceiling 2) Side to side multidirectional Repetitive movements performed in sitting and are designed to provide retraining effort needed for sustained muscle activation in tasks , cues to lean fwd and hold position x 10 counts: Patient does not move fwd<>sidesitting<> to fwd and is not able to transition or stretch out leg very far 3) Step and reach forward , cues for good knee flex, cues to move UE and LE together, cues to rotate  arms up to pronate his forearms 4)  Step and reach backwards , cues for BUE back, getting toe up and bending back knee 5) Step and reach sideways, cues to turn  head sideways, and turn head back to neutral, and to rotate arms and pronate forearms 6) Rock and reach forward/backward , cues to reach far fwd with UE's, patient not comfortable with feet apart , not rocking very far to get a good weight shift, not able to raise  heel or raise  toes much 7) Rock and reach sideways, cues for twist and look behind , only rotates side ways, unable to twist around or turn to look behind   functional component task with supervision 5 reps and simulated activities for: 1. Sit to stand x 10   needs cues to begin with UE up for correct start position 2. balance task with 1/2 foam and tapping to step x 10 , foam step ups x 10,  for being able to ascend and descend a curb safely.  3. Cards shuffling x 3, dealing with wrist rotation from front of deck and back of deck,to be able to manage golf peg for golf ball 4.coins in counter for coordination of fingers and hands, to improve turning pages in a book 5. Pegs in peg board for coordination of fingers, to improve finger coordination for using the phone  Walking with Big arm swing x 1000 feet x 3 and cues for swinging  Patient performed with instruction, verbal cues, tactile cues of therapist: goal: increase tissue extensibility, promote proper posture, improve mobility  Pt educated throughout session about proper posture and technique with exercises. Improved exercise technique, movement at target joints, use of target muscles after min to mod verbal, visual, tactile cues.                        PT Education - 10/16/19 1459    Education Details  LSVT BIG    Person(s) Educated  Patient    Methods  Explanation    Comprehension  Verbalized understanding;Returned demonstration          PT Long Term Goals - 10/08/19 1727      PT LONG TERM GOAL #1   Title  Patient will be  independent in home exercise program to improve strength/mobility for better functional independence with ADLs.    Time  2    Period  Weeks    Status  New    Target Date  10/22/19      PT LONG TERM GOAL #2   Title  Patient will increase Berg Balance score by > 6 points to demonstrate decreased fall risk during functional activities.    Time  4    Period  Weeks  Status  New    Target Date  11/12/19      PT LONG TERM GOAL #3   Title  Patient will be independent with ascend/descend 12 steps using single UE in step over step pattern without LOB.    Time  4    Period  Weeks    Status  New    Target Date  11/12/19      PT LONG TERM GOAL #4   Title  Patient will have no gait devialtions or path deviations during horizontal or vertical  head turns or gait speed changes.    Time  8    Period  Weeks    Status  New    Target Date  11/12/19            Plan - 10/16/19 1500    Clinical Impression Statement  Continues to have balance deficits typical with diagnosis. Patient performs intermediate level exercises without pain behaviors and needs verbal cuing for postural alignment and head positioning. Cuing is needed to stretch the leg out in seated side reaching. Fatigue with sit to stand but demonstrating more control. Patient has better stepping pattern with less stopping and better posture. Patient has difficulty with finishing BIG and needs extra cuing to perform exercises with correct amplitude and speed. Patient has difficulty with turning his head and rotating trunk with weight shifting exercises.  Patient has fatigue with standing exercises and needs constant VC to have correct posture. Patient has loss of balance and needs UE support intermittently thorough out exercise. Patient has slowness of movement during rotation and beginning movements.    Stability/Clinical Decision Making  Stable/Uncomplicated    Rehab Potential  Good    PT Frequency  4x / week    PT Duration  4 weeks     PT Treatment/Interventions  Gait training;Therapeutic exercise;Therapeutic activities;Balance training;Neuromuscular re-education;Manual techniques    PT Next Visit Plan  LSVT BIG    PT Home Exercise Plan  LSVT BIG    Consulted and Agree with Plan of Care  Patient       Patient will benefit from skilled therapeutic intervention in order to improve the following deficits and impairments:  Abnormal gait, Decreased activity tolerance, Decreased strength, Difficulty walking, Decreased balance, Decreased coordination, Decreased mobility, Impaired tone  Visit Diagnosis: Other abnormalities of gait and mobility  Difficulty in walking, not elsewhere classified     Problem List Patient Active Problem List   Diagnosis Date Noted  . Parkinson's disease ([place]) 04/06/2017  . 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 NSTEMI (non-ST elevated myocardial infarction) ([place]) 03/26/2014  . Acute myocardial infarction, subendocardial infarction ([place]) 03/26/2014  . Atherosclerotic heart disease of native coronary artery with unstable angina pectoris ([place])   . Ischemic cardiomyopathy   . Essential hypertension   . Hyperlipidemia with target LDL less than 70   . Tobacco abuse   . History of colonic polyps 02/26/2013    Alanson Puls, [place] DPT 10/16/2019, 3:23 PM  [place] MAIN [facility] SERVICES [address] [place], [place], [address] Phone: [phone]   Fax:  [phone]  Name: [name] [name] MRN: [id number] Date of Birth: [birth year]

## 2019-10-16 NOTE — Therapy (Signed)
[place] MAIN [facility] SERVICES [address] [place], [place], [address] Phone: [phone]   Fax:  [phone]  Speech Language Pathology Treatment  Patient Details  Name: [name] [name] MRN: [id number] Date of Birth: February 27, 1951 Referring Provider (SLP): Dr. Manuella Ghazi   Encounter Date: 10/15/2019  End of Session - 10/16/19 0837    Visit Number  3    Number of Visits  17    Date for SLP Re-Evaluation  11/08/19    Authorization Type  Medicare    Authorization Time Period  Start 10/08/2019    Authorization - Visit Number  3    Authorization - Number of Visits  10    SLP Start Time  1600    SLP Stop Time   1700    SLP Time Calculation (min)  60 min    Activity Tolerance  Patient tolerated treatment well       Past Medical History:  Diagnosis Date  . Acute non-Q wave ST elevation myocardial infarction (STEMI) 9/13-14/2015  . Atherosclerotic heart disease of native coronary artery with unstable angina pectoris ([place])    a. 03/2014 NSTEMI/Cath: LM nl, LAD 105m (FFR 0.76), D1 19m, LCX 121m, RCA 175m, EF 35%  . Essential hypertension   . GERD (gastroesophageal reflux disease)   . Hyperlipidemia with target LDL less than 70   . Ischemic cardiomyopathy    a. 03/2014 EF 35% by LV gram.  . Peripheral vision loss    right eye   . 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 Tobacco abuse     Past Surgical History:  Procedure Laterality Date  . ADENOIDECTOMY    . CARDIAC CATHETERIZATION     a. 03/2014 NSTEMI/Cath: LM nl, LAD 35m (FFR 0.76), D1 176m, LCX 173m, RCA 163m, EF 35%  . COLONOSCOPY WITH PROPOFOL N/A 05/30/2018   Procedure: COLONOSCOPY WITH PROPOFOL;  Surgeon: Robert Bellow, MD;  Location: [facility] ENDOSCOPY;  Service: Endoscopy;  Laterality: N/A;  . CORONARY ARTERY BYPASS GRAFT N/A 03/28/2014   Procedure: CORONARY ARTERY BYPASS GRAFTING (CABG) x  4 using left  internal mammary artery and bilateral saphenous leg vein.;  Surgeon: Rexene Alberts, MD;  Location: [place];  Service: Open Heart Surgery;  Laterality: N/A;  . INTRAOPERATIVE TRANSESOPHAGEAL ECHOCARDIOGRAM N/A 03/28/2014   Procedure: INTRAOPERATIVE TRANSESOPHAGEAL ECHOCARDIOGRAM;  Surgeon: Rexene Alberts, MD;  Location: [place];  Service: Open Heart Surgery;  Laterality: N/A;    There were no vitals filed for this visit.  Subjective Assessment - 10/16/19 0836    Subjective  Pt engaged and working hard            ADULT SLP TREATMENT - 10/16/19 0001      General Information   Behavior/Cognition  Alert;Cooperative;Pleasant mood    HPI  Parkinson's diease      Treatment Provided   Treatment provided  Cognitive-Linquistic      Pain Assessment   Pain Assessment  No/denies pain      Cognitive-Linquistic Treatment   Treatment focused on  Voice    Skilled Treatment  Daily Task #1 (Maximum sustained "ah"): Average 7 seconds, 85 dB. Daily Task 2 (Maximum fundamental frequency range): Highs: 15 high pitched "ah" (280 Hz). Lows: 15 low pitched "ah" (160 Hz). Daily task #3 (Maximum speech loudness drill of functional phrases): Average 75 dB.  Hierarchal  speech loudness drill: Read word, 75 dB. Homework: completed.  Off the cuff remarks: average 72 dB.        Assessment / Recommendations / Plan   Plan  Continue with current plan of care      Progression Toward Goals   Progression toward goals  Progressing toward goals       SLP Education - 10/16/19 0836    Education Details  LSVT-LOUD, duration    Person(s) Educated  Patient    Methods  Explanation    Comprehension  Verbalized understanding;Need further instruction         SLP Long Term Goals - 10/09/19 1453      SLP LONG TERM GOAL #1   Title  The patient will complete Daily Tasks (Maximum duration "ah", High/Lows, and Functional Phrases) at average loudness of 80 dB and with loud, good quality voice.    Time  4    Period  Weeks     Status  New    Target Date  11/08/19      SLP LONG TERM GOAL #2   Title  The patient will complete Hierarchal Speech Loudness reading drills (words/phrases, sentences, and paragraph) at average 75 dB and with loud, good quality voice.    Time  4    Period  Weeks    Status  New    Target Date  11/08/19      SLP LONG TERM GOAL #3   Title  The patient will participate in conversation, maintaining average loudness of 75 dB and loud, good quality voice.    Time  4    Period  Days    Status  New    Target Date  11/08/19      SLP LONG TERM GOAL #4   Title  The patient will complete homework daily.    Time  4    Period  Weeks    Status  New    Target Date  11/08/19       Plan - 10/16/19 [id number]    Clinical Impression Statement  The patient is completing daily tasks and hierarchal speech drill tasks with loud, good quality voice with minimal shaping required for loudness, quality, and pitch range. He requires significant cues/shaping for duration. His off the cuff remarks were significantly louder.    Speech Therapy Frequency  4x / week    Duration  4 weeks    Treatment/Interventions  SLP instruction and feedback;Patient/family education    Potential to Achieve Goals  Good    Potential Considerations  Ability to learn/carryover information;Previous level of function;Co-morbidities;Severity of impairments;Cooperation/participation level;Medical prognosis;Family/community support    SLP Home Exercise Plan  LSVT-LOUD Daily Homework    Consulted and Agree with Plan of Care  Patient       Patient will benefit from skilled therapeutic intervention in order to improve the following deficits and impairments:   Dysphonia    Problem List Patient Active Problem List   Diagnosis Date Noted  . Parkinson's disease ([place]) 04/06/2017  . 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 NSTEMI (non-ST elevated myocardial infarction) ([place]) 03/26/2014  . Acute myocardial infarction, subendocardial infarction ([place])  03/26/2014  . Atherosclerotic heart disease of native coronary artery with unstable angina pectoris ([place])   . Ischemic cardiomyopathy   . Essential hypertension   . Hyperlipidemia with target LDL less than 70   . Tobacco abuse   . History of colonic polyps 02/26/2013   Leroy Sea, MS/CCC- SLP  Lou Miner 10/16/2019, 8:39 AM  [place] MAIN [facility] SERVICES [address] [place], [place], [address] Phone: [phone]   Fax:  [phone]   Name: AAHIL GILE MRN: [id number] Date of Birth: [birth year]

## 2019-10-17 ENCOUNTER — Encounter: Payer: Self-pay | Admitting: Speech Pathology

## 2019-10-17 ENCOUNTER — Other Ambulatory Visit: Payer: Self-pay

## 2019-10-17 ENCOUNTER — Ambulatory Visit: Payer: Medicare Other | Admitting: Physical Therapy

## 2019-10-17 ENCOUNTER — Encounter: Payer: Self-pay | Admitting: Physical Therapy

## 2019-10-17 ENCOUNTER — Ambulatory Visit: Payer: Medicare Other | Admitting: Speech Pathology

## 2019-10-17 DIAGNOSIS — R2689 Other abnormalities of gait and mobility: Secondary | ICD-10-CM

## 2019-10-17 DIAGNOSIS — R262 Difficulty in walking, not elsewhere classified: Secondary | ICD-10-CM

## 2019-10-17 DIAGNOSIS — R490 Dysphonia: Secondary | ICD-10-CM

## 2019-10-17 NOTE — Therapy (Signed)
[place] MAIN [facility] SERVICES [address] [place], [place], [address] Phone: [phone]   Fax:  [phone]  Speech Language Pathology Treatment  Patient Details  Name: [name] [name] MRN: [id number] Date of Birth: 12-17-1950 Referring Provider (SLP): Dr. Manuella Ghazi   Encounter Date: 10/16/2019  End of Session - 10/17/19 0838    Visit Number  4    Number of Visits  17    Date for SLP Re-Evaluation  11/08/19    Authorization Type  Medicare    Authorization Time Period  Start 10/08/2019    Authorization - Visit Number  4    Authorization - Number of Visits  10    SLP Start Time  1600    SLP Stop Time   1700    SLP Time Calculation (min)  60 min    Activity Tolerance  Patient tolerated treatment well       Past Medical History:  Diagnosis Date  . Acute non-Q wave ST elevation myocardial infarction (STEMI) 9/13-14/2015  . Atherosclerotic heart disease of native coronary artery with unstable angina pectoris ([place])    a. 03/2014 NSTEMI/Cath: LM nl, LAD 28m (FFR 0.76), D1 132m, LCX 132m, RCA 152m, EF 35%  . Essential hypertension   . GERD (gastroesophageal reflux disease)   . Hyperlipidemia with target LDL less than 70   . Ischemic cardiomyopathy    a. 03/2014 EF 35% by LV gram.  . Peripheral vision loss    right eye   . 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 Tobacco abuse     Past Surgical History:  Procedure Laterality Date  . ADENOIDECTOMY    . CARDIAC CATHETERIZATION     a. 03/2014 NSTEMI/Cath: LM nl, LAD 69m (FFR 0.76), D1 162m, LCX 180m, RCA 151m, EF 35%  . COLONOSCOPY WITH PROPOFOL N/A 05/30/2018   Procedure: COLONOSCOPY WITH PROPOFOL;  Surgeon: Robert Bellow, MD;  Location: [facility] ENDOSCOPY;  Service: Endoscopy;  Laterality: N/A;  . CORONARY ARTERY BYPASS GRAFT N/A 03/28/2014   Procedure: CORONARY ARTERY BYPASS GRAFTING (CABG) x  4 using left  internal mammary artery and bilateral saphenous leg vein.;  Surgeon: Rexene Alberts, MD;  Location: [place];  Service: Open Heart Surgery;  Laterality: N/A;  . INTRAOPERATIVE TRANSESOPHAGEAL ECHOCARDIOGRAM N/A 03/28/2014   Procedure: INTRAOPERATIVE TRANSESOPHAGEAL ECHOCARDIOGRAM;  Surgeon: Rexene Alberts, MD;  Location: [place];  Service: Open Heart Surgery;  Laterality: N/A;    There were no vitals filed for this visit.  Subjective Assessment - 10/17/19 0838    Subjective  Pt engaged and working hard            ADULT SLP TREATMENT - 10/17/19 0001      General Information   Behavior/Cognition  Alert;Cooperative;Pleasant mood    HPI  Parkinson's diease      Treatment Provided   Treatment provided  Cognitive-Linquistic      Pain Assessment   Pain Assessment  No/denies pain      Cognitive-Linquistic Treatment   Treatment focused on  Voice    Skilled Treatment  Daily Task #1 (Maximum sustained "ah"): Average 7 seconds, 85 dB. Daily Task 2 (Maximum fundamental frequency range): Highs: 15 high pitched "ah" (280 Hz). Lows: 15 low pitched "ah" (160 Hz). Daily task #3 (Maximum speech loudness drill of functional phrases): Average 75 dB.  Hierarchal  speech loudness drill: Read words/phrases, 75 dB.  Generate words/phrases to answer questions, 75 dB given occasional cues. Homework: not completed (played golf).  Off the cuff remarks: average 72 dB.        Assessment / Recommendations / Plan   Plan  Continue with current plan of care      Progression Toward Goals   Progression toward goals  Progressing toward goals       SLP Education - 10/17/19 0838    Education Details  LSVT-LOUD, duration         SLP Long Term Goals - 10/09/19 1453      SLP LONG TERM GOAL #1   Title  The patient will complete Daily Tasks (Maximum duration "ah", High/Lows, and Functional Phrases) at average loudness of 80 dB and with loud, good quality voice.    Time  4    Period  Weeks    Status  New     Target Date  11/08/19      SLP LONG TERM GOAL #2   Title  The patient will complete Hierarchal Speech Loudness reading drills (words/phrases, sentences, and paragraph) at average 75 dB and with loud, good quality voice.    Time  4    Period  Weeks    Status  New    Target Date  11/08/19      SLP LONG TERM GOAL #3   Title  The patient will participate in conversation, maintaining average loudness of 75 dB and loud, good quality voice.    Time  4    Period  Days    Status  New    Target Date  11/08/19      SLP LONG TERM GOAL #4   Title  The patient will complete homework daily.    Time  4    Period  Weeks    Status  New    Target Date  11/08/19       Plan - 10/17/19 [id number]    Clinical Impression Statement  The patient is completing daily tasks and hierarchal speech drill tasks with loud, good quality voice with minimal shaping required for loudness, quality, and pitch range. He requires significant cues/shaping for duration. His off the cuff remarks are significantly louder.    Speech Therapy Frequency  4x / week    Duration  4 weeks    Treatment/Interventions  SLP instruction and feedback;Patient/family education    Potential to Achieve Goals  Good    Potential Considerations  Ability to learn/carryover information;Previous level of function;Co-morbidities;Severity of impairments;Cooperation/participation level;Medical prognosis;Family/community support    SLP Home Exercise Plan  LSVT-LOUD Daily Homework    Consulted and Agree with Plan of Care  Patient       Patient will benefit from skilled therapeutic intervention in order to improve the following deficits and impairments:   Dysphonia    Problem List Patient Active Problem List   Diagnosis Date Noted  . Parkinson's disease ([place]) 04/06/2017  . 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 NSTEMI (non-ST elevated myocardial infarction) ([place]) 03/26/2014  . Acute myocardial infarction, subendocardial infarction ([place]) 03/26/2014  .  Atherosclerotic heart disease of native coronary artery with unstable angina pectoris ([place])   . Ischemic cardiomyopathy   . Essential hypertension   . Hyperlipidemia with target LDL less than 70   . Tobacco abuse   . History of colonic polyps 02/26/2013   Leroy Sea, MS/CCC- SLP  Lou Miner 10/17/2019, 8:47 AM  [place]  [facility] MAIN [facility] SERVICES [address] [place], [place], [address] Phone: [phone]   Fax:  [phone]   Name: BRAIJON GIFFEN MRN: [id number] Date of Birth: [birth year]

## 2019-10-17 NOTE — Therapy (Addendum)
[place] MAIN [facility] SERVICES [address] [place], [place], [address] Phone: [phone]   Fax:  [phone]  Physical Therapy Treatment  Patient Details  Name: [name] [name] MRN: [id number] Date of Birth: 11-Oct-1950 Referring Provider (PT): Jennings Books   Encounter Date: 10/17/2019  PT End of Session - 10/17/19 1507    Visit Number  4    Number of Visits  17    Date for PT Re-Evaluation  11/12/19    PT Start Time  0300    PT Stop Time  0355    PT Time Calculation (min)  55 min    Equipment Utilized During Treatment  Gait belt    Activity Tolerance  Patient tolerated treatment well    Behavior During Therapy  WFL for tasks assessed/performed       Past Medical History:  Diagnosis Date  . Acute non-Q wave ST elevation myocardial infarction (STEMI) 9/13-14/2015  . Atherosclerotic heart disease of native coronary artery with unstable angina pectoris ([place])    a. 03/2014 NSTEMI/Cath: LM nl, LAD 68m (FFR 0.76), D1 132m, LCX 117m, RCA 135m, EF 35%  . Essential hypertension   . GERD (gastroesophageal reflux disease)   . Hyperlipidemia with target LDL less than 70   . Ischemic cardiomyopathy    a. 03/2014 EF 35% by LV gram.  . Peripheral vision loss    right eye   . 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 Tobacco abuse     Past Surgical History:  Procedure Laterality Date  . ADENOIDECTOMY    . CARDIAC CATHETERIZATION     a. 03/2014 NSTEMI/Cath: LM nl, LAD 55m (FFR 0.76), D1 143m, LCX 170m, RCA 135m, EF 35%  . COLONOSCOPY WITH PROPOFOL N/A 05/30/2018   Procedure: COLONOSCOPY WITH PROPOFOL;  Surgeon: Robert Bellow, MD;  Location: [facility];  Service: Endoscopy;  Laterality: N/A;  . CORONARY ARTERY BYPASS GRAFT N/A 03/28/2014   Procedure: CORONARY ARTERY BYPASS GRAFTING (CABG) x  4 using left internal mammary artery and bilateral saphenous leg vein.;   Surgeon: Rexene Alberts, MD;  Location: [place];  Service: Open Heart Surgery;  Laterality: N/A;  . INTRAOPERATIVE TRANSESOPHAGEAL ECHOCARDIOGRAM N/A 03/28/2014   Procedure: INTRAOPERATIVE TRANSESOPHAGEAL ECHOCARDIOGRAM;  Surgeon: Rexene Alberts, MD;  Location: [place];  Service: Open Heart Surgery;  Laterality: N/A;    There were no vitals filed for this visit.  Subjective Assessment - 10/17/19 1506    Subjective  Patient feels weaker on the left side.He tried to do his exercises last night, he is sore.    Pertinent History  Patient had bipass surgery and then the parkinsons began 2017. He has a resting tremmor. He does not use a walking device.He plays golf 2-3 times / week. He sometimes has difficulty with yard work, he has back pain that is intermittent.    How long can you sit comfortably?  unlimited    How long can you stand comfortably?  30 minutes    How long can you walk comfortably?  30 minutes level    Currently in Pain?  No/denies    Pain Score  0-No pain        Treatment:   Patient seen for LSVT Daily Session Maximal Daily Exercises for facilitation/coordination of movement Sustained movements are designed to rescale the amplitude of movement output  for generalization to daily functional activities .Performed as follows for 1 set of 10 repetitions each multidirectional sustained movements  1) Floor to ceiling , cues to hold for 10 seconds,needs modeling for correct positions , VC to reach out further and to reach up to the ceiling 2) Side to side multidirectional Repetitive movements performed in sitting and are designed to provide retraining effort needed for sustained muscle activation in tasks , cues to lean fwd and hold position x 10 counts: Patient does not move fwd<>sidesitting<> to fwd and is not able to transition or stretch out leg very far 3) Step and reach forward , cues for good knee flex, cues to move UE and LE together, cues to rotate  arms up to pronate his forearms 4)  Step and reach backwards , cues for BUE back, getting toe up and bending back knee 5) Step and reach sideways, cues to turn  head sideways, and turn head back to neutral, and to rotate arms and pronate forearms 6) Rock and reach forward/backward , cues to reach far fwd with UE's, patient not comfortable with feet apart , not rocking very far to get a good weight shift, not able to raise  heel or raise  toes much 7) Rock and reach sideways, cues for twist and look behind , only rotates side ways, unable to twist around or turn to look behind   functional component task with supervision 5 reps and simulated activities for: 1. Sit to standx 10needs cues to begin with UE up for correct start position 2.balance task with 1/2 foam and tapping to step x 10 , foam step ups x 10 , for being able to ascend and descend a curb safely 3. Cards shuffling x 3, dealing with wrist rotation from front of deck and back of deck,to be able to manage golf peg for golf ball 4.grooved pegboardr for coordination of fingers and hands, to improve turning pages in a book 5. Pegs in peg board with squeeze tool for coordination of fingers, to improve finger coordination for using the phone Patient performed with instruction, verbal cues, tactile cues of therapist: goal: increase tissue extensibility, promote proper posture, improve mobility  Pt educated throughout session about proper posture and technique with exercises. Improved exercise technique, movement at target joints, use of target muscles after min to mod verbal, visual, tactile cues.                        PT Education - 10/17/19 1506    Education Details  LSVT BIG    Person(s) Educated  Patient    Methods  Explanation    Comprehension  Verbalized understanding;Returned demonstration;Need further instruction;Tactile cues required;Verbal cues required          PT Long Term Goals - 10/08/19 1727      PT LONG TERM GOAL #1   Title   Patient will be independent in home exercise program to improve strength/mobility for better functional independence with ADLs.    Time  2    Period  Weeks    Status  New    Target Date  10/22/19      PT LONG TERM GOAL #2   Title  Patient will increase Berg Balance score by > 6 points to demonstrate decreased fall risk during functional activities.    Time  4    Period  Weeks    Status  New    Target Date  11/12/19  PT LONG TERM GOAL #3   Title  Patient will be independent with ascend/descend 12 steps using single UE in step over step pattern without LOB.    Time  4    Period  Weeks    Status  New    Target Date  11/12/19      PT LONG TERM GOAL #4   Title  Patient will have no gait devialtions or path deviations during horizontal or vertical  head turns or gait speed changes.    Time  8    Period  Weeks    Status  New    Target Date  11/12/19            Plan - 10/17/19 1511    Clinical Impression Statement  Patient has better stepping pattern with less stopping and better posture. Patient has difficulty with finishing BIG and needs extra cuing to perform exercises with correct amplitude and speed. Patient has difficulty with turning his head and rotating trunk with weight shifting exercises.  Patient has fatigue with standing exercises and needs constant VC to have correct posture. Patient has loss of balance and needs UE support intermittently thorough out exercise. Patient has slowness of movement during rotation and beginning movements.   Stability/Clinical Decision Making  Stable/Uncomplicated    Rehab Potential  Good    PT Frequency  4x / week    PT Duration  4 weeks    PT Treatment/Interventions  Gait training;Therapeutic exercise;Therapeutic activities;Balance training;Neuromuscular re-education;Manual techniques    PT Next Visit Plan  LSVT BIG    PT Home Exercise Plan  LSVT BIG    Consulted and Agree with Plan of Care  Patient       Patient will benefit from  skilled therapeutic intervention in order to improve the following deficits and impairments:  Abnormal gait, Decreased activity tolerance, Decreased strength, Difficulty walking, Decreased balance, Decreased coordination, Decreased mobility, Impaired tone  Visit Diagnosis: Other abnormalities of gait and mobility  Difficulty in walking, not elsewhere classified  Dysphonia     Problem List Patient Active Problem List   Diagnosis Date Noted  . Parkinson's disease ([place]) 04/06/2017  . 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 NSTEMI (non-ST elevated myocardial infarction) ([place]) 03/26/2014  . Acute myocardial infarction, subendocardial infarction ([place]) 03/26/2014  . Atherosclerotic heart disease of native coronary artery with unstable angina pectoris ([place])   . Ischemic cardiomyopathy   . Essential hypertension   . Hyperlipidemia with target LDL less than 70   . Tobacco abuse   . History of colonic polyps 02/26/2013    Alanson Puls, [place] DPT 10/17/2019, 3:12 PM  [place] MAIN [facility] SERVICES [address] [place], [place], [address] Phone: [phone]   Fax:  [phone]  Name: JAIREN TREMBLY MRN: [id number] Date of Birth: [birth year]

## 2019-10-18 ENCOUNTER — Encounter: Payer: Self-pay | Admitting: Speech Pathology

## 2019-10-18 NOTE — Therapy (Signed)
[place] MAIN [facility] SERVICES [address] [place], [place], [address] Phone: [phone]   Fax:  [phone]  Speech Language Pathology Treatment  Patient Details  Name: [name] [name] MRN: [id number] Date of Birth: [birth year] Referring Provider (SLP): Dr. Manuella Ghazi   Encounter Date: 10/17/2019  End of Session - 10/18/19 0752    Visit Number  5    Number of Visits  17    Date for SLP Re-Evaluation  11/08/19    Authorization Type  Medicare    Authorization Time Period  Start 10/08/2019    Authorization - Visit Number  5    Authorization - Number of Visits  10    SLP Start Time  1600    SLP Stop Time   1700    SLP Time Calculation (min)  60 min    Activity Tolerance  Patient tolerated treatment well       Past Medical History:  Diagnosis Date  . Acute non-Q wave ST elevation myocardial infarction (STEMI) 9/13-14/2015  . Atherosclerotic heart disease of native coronary artery with unstable angina pectoris ([place])    a. 03/2014 NSTEMI/Cath: LM nl, LAD 51m (FFR 0.76), D1 12m, LCX 18m, RCA 165m, EF 35%  . Essential hypertension   . GERD (gastroesophageal reflux disease)   . Hyperlipidemia with target LDL less than 70   . Ischemic cardiomyopathy    a. 03/2014 EF 35% by LV gram.  . Peripheral vision loss    right eye   . 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 Tobacco abuse     Past Surgical History:  Procedure Laterality Date  . ADENOIDECTOMY    . CARDIAC CATHETERIZATION     a. 03/2014 NSTEMI/Cath: LM nl, LAD 29m (FFR 0.76), D1 155m, LCX 137m, RCA 156m, EF 35%  . COLONOSCOPY WITH PROPOFOL N/A 05/30/2018   Procedure: COLONOSCOPY WITH PROPOFOL;  Surgeon: Robert Bellow, MD;  Location: [facility] ENDOSCOPY;  Service: Endoscopy;  Laterality: N/A;  . CORONARY ARTERY BYPASS GRAFT N/A 03/28/2014   Procedure: CORONARY ARTERY BYPASS GRAFTING (CABG) x  4 using left  internal mammary artery and bilateral saphenous leg vein.;  Surgeon: Rexene Alberts, MD;  Location: [place];  Service: Open Heart Surgery;  Laterality: N/A;  . INTRAOPERATIVE TRANSESOPHAGEAL ECHOCARDIOGRAM N/A 03/28/2014   Procedure: INTRAOPERATIVE TRANSESOPHAGEAL ECHOCARDIOGRAM;  Surgeon: Rexene Alberts, MD;  Location: [place];  Service: Open Heart Surgery;  Laterality: N/A;    There were no vitals filed for this visit.  Subjective Assessment - 10/18/19 0751    Subjective  Pt engaged and working hard            ADULT SLP TREATMENT - 10/18/19 0001      General Information   Behavior/Cognition  Alert;Cooperative;Pleasant mood    HPI  Parkinson's diease      Treatment Provided   Treatment provided  Cognitive-Linquistic      Pain Assessment   Pain Assessment  No/denies pain      Cognitive-Linquistic Treatment   Treatment focused on  Voice    Skilled Treatment  Daily Task #1 (Maximum sustained "ah"): Average 7 seconds, 85 dB. Daily Task 2 (Maximum fundamental frequency range): Highs: 15 high pitched "ah" (270 Hz). Lows: 15 low pitched "ah" (155 Hz). Daily task #3 (Maximum speech loudness drill of functional phrases): Average 75 dB.  Hierarchal  speech loudness drill: Read phrases, 75 dB.  Read sentences, 75 dB given occasional cues. Homework: completed.  Off the cuff remarks: average 72 dB.        Assessment / Recommendations / Plan   Plan  Continue with current plan of care      Progression Toward Goals   Progression toward goals  Progressing toward goals       SLP Education - 10/18/19 0751    Education Details  LSVT-LOUD, duration    Person(s) Educated  Patient    Methods  Explanation    Comprehension  Verbalized understanding         SLP Long Term Goals - 10/09/19 1453      SLP LONG TERM GOAL #1   Title  The patient will complete Daily Tasks (Maximum duration "ah", High/Lows, and Functional Phrases) at average loudness of 80 dB and with loud, good quality voice.     Time  4    Period  Weeks    Status  New    Target Date  11/08/19      SLP LONG TERM GOAL #2   Title  The patient will complete Hierarchal Speech Loudness reading drills (words/phrases, sentences, and paragraph) at average 75 dB and with loud, good quality voice.    Time  4    Period  Weeks    Status  New    Target Date  11/08/19      SLP LONG TERM GOAL #3   Title  The patient will participate in conversation, maintaining average loudness of 75 dB and loud, good quality voice.    Time  4    Period  Days    Status  New    Target Date  11/08/19      SLP LONG TERM GOAL #4   Title  The patient will complete homework daily.    Time  4    Period  Weeks    Status  New    Target Date  11/08/19       Plan - 10/18/19 [id number]    Clinical Impression Statement  The patient is completing daily tasks and hierarchal speech drill tasks with loud, good quality voice with minimal shaping required for loudness, quality, and pitch range. He requires significant cues/shaping for duration. His off the cuff remarks are significantly louder.    Speech Therapy Frequency  4x / week    Duration  4 weeks    Treatment/Interventions  SLP instruction and feedback;Patient/family education    Potential to Achieve Goals  Good    Potential Considerations  Ability to learn/carryover information;Previous level of function;Co-morbidities;Severity of impairments;Cooperation/participation level;Medical prognosis;Family/community support    SLP Home Exercise Plan  LSVT-LOUD Daily Homework    Consulted and Agree with Plan of Care  Patient       Patient will benefit from skilled therapeutic intervention in order to improve the following deficits and impairments:   Dysphonia    Problem List Patient Active Problem List   Diagnosis Date Noted  . Parkinson's disease ([place]) 04/06/2017  . 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 NSTEMI (non-ST elevated myocardial infarction) ([place]) 03/26/2014  . Acute myocardial infarction,  subendocardial infarction ([place]) 03/26/2014  . Atherosclerotic heart disease of native coronary artery with unstable angina pectoris ([place])   . Ischemic cardiomyopathy   . Essential hypertension   . Hyperlipidemia with target LDL less than 70   . Tobacco abuse   . History of colonic polyps 02/26/2013  Leroy Sea, MS/CCC- SLP  Lou Miner 10/18/2019, 7:53 AM  [place] MAIN [facility] SERVICES [address] [place], [place], [address] Phone: [phone]   Fax:  [phone]   Name: [name] [name] MRN: [id number] Date of Birth: 07/18/1950

## 2019-10-21 ENCOUNTER — Ambulatory Visit: Payer: Medicare Other | Admitting: Physical Therapy

## 2019-10-21 ENCOUNTER — Ambulatory Visit: Payer: Medicare Other | Admitting: Speech Pathology

## 2019-10-21 ENCOUNTER — Other Ambulatory Visit: Payer: Self-pay

## 2019-10-21 ENCOUNTER — Encounter: Payer: Self-pay | Admitting: Physical Therapy

## 2019-10-21 DIAGNOSIS — R490 Dysphonia: Secondary | ICD-10-CM

## 2019-10-21 DIAGNOSIS — R262 Difficulty in walking, not elsewhere classified: Secondary | ICD-10-CM

## 2019-10-21 DIAGNOSIS — R2689 Other abnormalities of gait and mobility: Secondary | ICD-10-CM

## 2019-10-21 NOTE — Therapy (Addendum)
[place] MAIN [facility] SERVICES [address] [place], [place], [address] Phone: [phone]   Fax:  [phone]  Physical Therapy Treatment  Patient Details  Name: [name] [name] MRN: [id number] Date of Birth: Sep 21, 1950 Referring Provider (PT): Jennings Books   Encounter Date: 10/21/2019  PT End of Session - 10/21/19 1506    Visit Number  5    Number of Visits  17    Date for PT Re-Evaluation  11/12/19    PT Start Time  0300    PT Stop Time  0355    PT Time Calculation (min)  55 min    Equipment Utilized During Treatment  Gait belt    Activity Tolerance  Patient tolerated treatment well    Behavior During Therapy  WFL for tasks assessed/performed       Past Medical History:  Diagnosis Date  . Acute non-Q wave ST elevation myocardial infarction (STEMI) 9/13-14/2015  . Atherosclerotic heart disease of native coronary artery with unstable angina pectoris ([place])    a. 03/2014 NSTEMI/Cath: LM nl, LAD 23m (FFR 0.76), D1 17m, LCX 121m, RCA 118m, EF 35%  . Essential hypertension   . GERD (gastroesophageal reflux disease)   . Hyperlipidemia with target LDL less than 70   . Ischemic cardiomyopathy    a. 03/2014 EF 35% by LV gram.  . Peripheral vision loss    right eye   . 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 Tobacco abuse     Past Surgical History:  Procedure Laterality Date  . ADENOIDECTOMY    . CARDIAC CATHETERIZATION     a. 03/2014 NSTEMI/Cath: LM nl, LAD 42m (FFR 0.76), D1 112m, LCX 146m, RCA 151m, EF 35%  . COLONOSCOPY WITH PROPOFOL N/A 05/30/2018   Procedure: COLONOSCOPY WITH PROPOFOL;  Surgeon: Robert Bellow, MD;  Location: [facility] ENDOS[facility];  Service: Endoscopy;  Laterality: N/A;  . CORONARY ARTERY BYPASS GRAFT N/A 03/28/2014   Procedure: CORONARY ARTERY BYPASS GRAFTING (CABG) x  4 using left internal mammary artery and bilateral saphenous leg vein.;   Surgeon: Rexene Alberts, MD;  Location: [place];  Service: Open Heart Surgery;  Laterality: N/A;  . INTRAOPERATIVE TRANSESOPHAGEAL ECHOCARDIOGRAM N/A 03/28/2014   Procedure: INTRAOPERATIVE TRANSESOPHAGEAL ECHOCARDIOGRAM;  Surgeon: Rexene Alberts, MD;  Location: [place];  Service: Open Heart Surgery;  Laterality: N/A;    There were no vitals filed for this visit.  Subjective Assessment - 10/21/19 1505    Subjective  Patient feels weaker on the left side.He tried to do his exercises last night, he is sore.    Pertinent History  Patient had bipass surgery and then the parkinsons began 2017. He has a resting tremmor. He does not use a walking device.He plays golf 2-3 times / week. He sometimes has difficulty with yard work, he has back pain that is intermittent.    How long can you sit comfortably?  unlimited    How long can you stand comfortably?  30 minutes    How long can you walk comfortably?  30 minutes level    Currently in Pain?  No/denies    Pain Score  0-No pain        Treatment:   Patient seen for LSVT Daily Session Maximal Daily Exercises for facilitation/coordination of movement Sustained movements are designed to rescale the amplitude of movement output  for generalization to daily functional activities .Performed as follows for 1 set of 10 repetitions each multidirectional sustained movements  1) Floor to ceiling , cues to hold for 10 seconds,needs modeling for correct positions , VC to reach out further and to reach up to the ceiling 2) Side to side multidirectional Repetitive movements performed in sitting and are designed to provide retraining effort needed for sustained muscle activation in tasks , cues to lean fwd and hold position x 10 counts: Patient does not move fwd<>sidesitting<> to fwd and is not able to transition or stretch out leg very far 3) Step and reach forward , cues for good knee flex, cues to move UE and LE together, cues to rotate  arms up to pronate his forearms 4)  Step and reach backwards , cues for BUE back, getting toe up and bending back knee 5) Step and reach sideways, cues to turn  head sideways, and turn head back to neutral, and to rotate arms and pronate forearms 6) Rock and reach forward/backward , cues to reach far fwd with UE's, patient not comfortable with feet apart , not rocking very far to get a good weight shift, not able to raise  heel or raise  toes much 7) Rock and reach sideways, cues for twist and look behind , only rotates side ways, unable to twist around or turn to look behind   functional component task with supervision 5 reps and simulated activities for: 1. Sit to stand x 10   needs cues to begin with UE up for correct start position 2.balance task with 1/2 foam and tapping to step x 10 , foam step ups x 10 , for being able to ascend and descend a curb safely. 3. Cards shuffling x 3, dealing with wrist rotation from front of deck and back of deck,to be able to manage golf peg for golf ball 4.grooved pegboardr for coordination of fingers and hands, to improve turning pages in a book 5.Pegs in peg board with squeeze tool for coordination of fingers,  to improve finger coordination for using the phone  Patient performed with instruction, verbal cues, tactile cues of therapist: goal: increase tissue extensibility, promote proper posture, improve mobility  Pt educated throughout session about proper posture and technique with exercises. Improved exercise technique, movement at target joints, use of target muscles after min to mod verbal, visual, tactile cues.                       PT Education - 10/21/19 1506    Education Details  LSVT BIG    Person(s) Educated  Patient    Methods  Explanation    Comprehension  Verbalized understanding;Returned demonstration;Verbal cues required;Tactile cues required;Need further instruction          PT Long Term Goals - 10/08/19 1727      PT LONG TERM GOAL #1   Title   Patient will be independent in home exercise program to improve strength/mobility for better functional independence with ADLs.    Time  2    Period  Weeks    Status  New    Target Date  10/22/19      PT LONG TERM GOAL #2   Title  Patient will increase Berg Balance score by > 6 points to demonstrate decreased fall risk during functional activities.    Time  4    Period  Weeks    Status  New    Target Date  11/12/19      PT LONG TERM GOAL #3   Title  Patient will be independent with ascend/descend 12 steps using single UE in step over step pattern without LOB.    Time  4    Period  Weeks    Status  New    Target Date  11/12/19      PT LONG TERM GOAL #4   Title  Patient will have no gait devialtions or path deviations during horizontal or vertical  head turns or gait speed changes.    Time  8    Period  Weeks    Status  New    Target Date  11/12/19            Plan - 10/21/19 1507    Clinical Impression Statement  Patient needs cueing for BIG swing and BIG amplitude.  Patient needs cueing for correct BIG arm swing. Patient improves with practice. Patient needs cuing to consistently swing her arms and also swing reciprocally. Patient needs cuing to swing UE's reciprocally with weight shift exercise and with backward stepping and correct UE positioning.   Stability/Clinical Decision Making  Stable/Uncomplicated    Rehab Potential  Good    PT Frequency  4x / week    PT Duration  4 weeks    PT Treatment/Interventions  Gait training;Therapeutic exercise;Therapeutic activities;Balance training;Neuromuscular re-education;Manual techniques    PT Next Visit Plan  LSVT BIG    PT Home Exercise Plan  LSVT BIG    Consulted and Agree with Plan of Care  Patient       Patient will benefit from skilled therapeutic intervention in order to improve the following deficits and impairments:  Abnormal gait, Decreased activity tolerance, Decreased strength, Difficulty walking, Decreased balance,  Decreased coordination, Decreased mobility, Impaired tone  Visit Diagnosis: Difficulty in walking, not elsewhere classified  Other abnormalities of gait and mobility     Problem List Patient Active Problem List   Diagnosis Date Noted  . Parkinson's disease ([place]) 04/06/2017  . 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 NSTEMI (non-ST elevated myocardial infarction) ([place]) 03/26/2014  . Acute myocardial infarction, subendocardial infarction ([place]) 03/26/2014  . Atherosclerotic heart disease of native coronary artery with unstable angina pectoris ([place])   . Ischemic cardiomyopathy   . Essential hypertension   . Hyperlipidemia with target LDL less than 70   . Tobacco abuse   . History of colonic polyps 02/26/2013    Arelia Sneddon SPT DPT 10/21/2019, 3:08 PM  [place] MAIN [facility] SERVICES [address] [place], [place], [address] Phone: [phone]   Fax:  [phone]  Name: [name] [name] MRN: [id number] Date of Birth: 03-13-1951

## 2019-10-22 ENCOUNTER — Encounter: Payer: Self-pay | Admitting: Speech Pathology

## 2019-10-22 ENCOUNTER — Other Ambulatory Visit: Payer: Self-pay

## 2019-10-22 ENCOUNTER — Ambulatory Visit: Payer: Medicare Other | Admitting: Speech Pathology

## 2019-10-22 ENCOUNTER — Ambulatory Visit: Payer: Medicare Other | Admitting: Physical Therapy

## 2019-10-22 ENCOUNTER — Encounter: Payer: Self-pay | Admitting: Physical Therapy

## 2019-10-22 DIAGNOSIS — R490 Dysphonia: Secondary | ICD-10-CM

## 2019-10-22 DIAGNOSIS — R262 Difficulty in walking, not elsewhere classified: Secondary | ICD-10-CM

## 2019-10-22 DIAGNOSIS — R2689 Other abnormalities of gait and mobility: Secondary | ICD-10-CM

## 2019-10-22 NOTE — Therapy (Signed)
[place] MAIN [facility] SERVICES [address] [place], [place], [address] Phone: [phone]   Fax:  [phone]  Speech Language Pathology Treatment  Patient Details  Name: [name] [name] MRN: [id number] Date of Birth: [birth year] Referring Provider (SLP): Dr. Manuella Ghazi   Encounter Date: 10/21/2019  End of Session - 10/22/19 0843    Visit Number  6    Number of Visits  17    Date for SLP Re-Evaluation  11/08/19    Authorization Type  Medicare    Authorization Time Period  Start 10/08/2019    Authorization - Visit Number  6    Authorization - Number of Visits  10    SLP Start Time  1600    SLP Stop Time   1700    SLP Time Calculation (min)  60 min    Activity Tolerance  Patient tolerated treatment well       Past Medical History:  Diagnosis Date  . Acute non-Q wave ST elevation myocardial infarction (STEMI) 9/13-14/2015  . Atherosclerotic heart disease of native coronary artery with unstable angina pectoris ([place])    a. 03/2014 NSTEMI/Cath: LM nl, LAD 34m (FFR 0.76), D1 154m, LCX 175m, RCA 182m, EF 35%  . Essential hypertension   . GERD (gastroesophageal reflux disease)   . Hyperlipidemia with target LDL less than 70   . Ischemic cardiomyopathy    a. 03/2014 EF 35% by LV gram.  . Peripheral vision loss    right eye   . 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 Tobacco abuse     Past Surgical History:  Procedure Laterality Date  . ADENOIDECTOMY    . CARDIAC CATHETERIZATION     a. 03/2014 NSTEMI/Cath: LM nl, LAD 78m (FFR 0.76), D1 141m, LCX 133m, RCA 183m, EF 35%  . COLONOSCOPY WITH PROPOFOL N/A 05/30/2018   Procedure: COLONOSCOPY WITH PROPOFOL;  Surgeon: Robert Bellow, MD;  Location: [facility];  Service: Endoscopy;  Laterality: N/A;  . CORONARY ARTERY BYPASS GRAFT N/A 03/28/2014   Procedure: CORONARY ARTERY BYPASS GRAFTING (CABG) x  4 using left  internal mammary artery and bilateral saphenous leg vein.;  Surgeon: Rexene Alberts, MD;  Location: [place];  Service: Open Heart Surgery;  Laterality: N/A;  . INTRAOPERATIVE TRANSESOPHAGEAL ECHOCARDIOGRAM N/A 03/28/2014   Procedure: INTRAOPERATIVE TRANSESOPHAGEAL ECHOCARDIOGRAM;  Surgeon: Rexene Alberts, MD;  Location: [place];  Service: Open Heart Surgery;  Laterality: N/A;    There were no vitals filed for this visit.  Subjective Assessment - 10/22/19 0842    Subjective  Pt engaged and working hard, reports that people are not asking his "What?" as often            ADULT SLP TREATMENT - 10/22/19 0001      General Information   Behavior/Cognition  Alert;Cooperative;Pleasant mood    HPI  Parkinson's diease      Treatment Provided   Treatment provided  Cognitive-Linquistic      Pain Assessment   Pain Assessment  No/denies pain      Cognitive-Linquistic Treatment   Treatment focused on  Voice    Skilled Treatment  Daily Task #1 (Maximum sustained "ah"): Average 10 seconds, 85 dB. Daily Task 2 (Maximum fundamental frequency range): Highs: 15 high pitched "ah" (240 Hz). Lows: 15 low pitched "ah" (145 Hz). Daily task #3 (Maximum speech  loudness drill of functional phrases): Average 75 dB.  Hierarchal speech loudness drill: Read sentences, 75 dB.  Homework: completed.  Off the cuff remarks: average 72 dB.        Assessment / Recommendations / Plan   Plan  Continue with current plan of care      Progression Toward Goals   Progression toward goals  Progressing toward goals       SLP Education - 10/22/19 0843    Education Details  LSVT-LOUD, duration    Person(s) Educated  Patient    Methods  Explanation    Comprehension  Verbalized understanding;Need further instruction         SLP Long Term Goals - 10/09/19 1453      SLP LONG TERM GOAL #1   Title  The patient will complete Daily Tasks (Maximum duration "ah", High/Lows, and Functional Phrases) at average loudness of 80 dB  and with loud, good quality voice.    Time  4    Period  Weeks    Status  New    Target Date  11/08/19      SLP LONG TERM GOAL #2   Title  The patient will complete Hierarchal Speech Loudness reading drills (words/phrases, sentences, and paragraph) at average 75 dB and with loud, good quality voice.    Time  4    Period  Weeks    Status  New    Target Date  11/08/19      SLP LONG TERM GOAL #3   Title  The patient will participate in conversation, maintaining average loudness of 75 dB and loud, good quality voice.    Time  4    Period  Days    Status  New    Target Date  11/08/19      SLP LONG TERM GOAL #4   Title  The patient will complete homework daily.    Time  4    Period  Weeks    Status  New    Target Date  11/08/19       Plan - 10/22/19 0844    Clinical Impression Statement  The patient is completing daily tasks and hierarchal speech drill tasks with loud, good quality voice with minimal shaping required for loudness, quality, and pitch range. He requires significant cues/shaping for duration, however he increased his time by 40% today. His off the cuff remarks remain significantly louder.    Speech Therapy Frequency  4x / week    Duration  4 weeks    Treatment/Interventions  SLP instruction and feedback;Patient/family education    Potential to Achieve Goals  Good    Potential Considerations  Ability to learn/carryover information;Previous level of function;Co-morbidities;Severity of impairments;Cooperation/participation level;Medical prognosis;Family/community support    SLP Home Exercise Plan  LSVT-LOUD Daily Homework    Consulted and Agree with Plan of Care  Patient       Patient will benefit from skilled therapeutic intervention in order to improve the following deficits and impairments:   Dysphonia    Problem List Patient Active Problem List   Diagnosis Date Noted  . Parkinson's disease ([place]) 04/06/2017  . 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 NSTEMI (non-ST  elevated myocardial infarction) ([place]) 03/26/2014  . Acute myocardial infarction, subendocardial infarction ([place]) 03/26/2014  . Atherosclerotic heart disease of native coronary artery with unstable angina pectoris ([place])   . Ischemic cardiomyopathy   . Essential hypertension   . Hyperlipidemia with target LDL less than 70   .  Tobacco abuse   . History of colonic polyps 02/26/2013   Leroy Sea, MS/CCC- SLP  Lou Miner 10/22/2019, 8:45 AM  [place] [address] [place], [place], [address] Phone: [phone]   Fax:  [phone]   Name: HUNTINGTON GARRAND MRN: [id number] Date of Birth: Dec 31, 1950

## 2019-10-22 NOTE — Therapy (Addendum)
[place] MAIN [facility] SERVICES [address] [place], [place], [address] Phone: [phone]   Fax:  [phone]  Physical Therapy Treatment  Patient Details  Name: [name] [name] MRN: [id number] Date of Birth: December 21, 1950 Referring Provider (PT): Jennings Books   Encounter Date: 10/22/2019  PT End of Session - 10/22/19 1513    Visit Number  6    Number of Visits  17    Date for PT Re-Evaluation  11/12/19    PT Start Time  0300    PT Stop Time  0355    PT Time Calculation (min)  55 min    Equipment Utilized During Treatment  Gait belt    Activity Tolerance  Patient tolerated treatment well    Behavior During Therapy  WFL for tasks assessed/performed       Past Medical History:  Diagnosis Date  . Acute non-Q wave ST elevation myocardial infarction (STEMI) 9/13-14/2015  . Atherosclerotic heart disease of native coronary artery with unstable angina pectoris ([place])    a. 03/2014 NSTEMI/Cath: LM nl, LAD 57m (FFR 0.76), D1 110m, LCX 169m, RCA 165m, EF 35%  . Essential hypertension   . GERD (gastroesophageal reflux disease)   . Hyperlipidemia with target LDL less than 70   . Ischemic cardiomyopathy    a. 03/2014 EF 35% by LV gram.  . Peripheral vision loss    right eye   . 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 Tobacco abuse     Past Surgical History:  Procedure Laterality Date  . ADENOIDECTOMY    . CARDIAC CATHETERIZATION     a. 03/2014 NSTEMI/Cath: LM nl, LAD 24m (FFR 0.76), D1 141m, LCX 172m, RCA 16m, EF 35%  . COLONOSCOPY WITH PROPOFOL N/A 05/30/2018   Procedure: COLONOSCOPY WITH PROPOFOL;  Surgeon: Robert Bellow, MD;  Location: [facility];  Service: Endoscopy;  Laterality: N/A;  . CORONARY ARTERY BYPASS GRAFT N/A 03/28/2014   Procedure: CORONARY ARTERY BYPASS GRAFTING (CABG) x  4 using left internal mammary artery and bilateral saphenous leg vein.;   Surgeon: Rexene Alberts, MD;  Location: [place];  Service: Open Heart Surgery;  Laterality: N/A;  . INTRAOPERATIVE TRANSESOPHAGEAL ECHOCARDIOGRAM N/A 03/28/2014   Procedure: INTRAOPERATIVE TRANSESOPHAGEAL ECHOCARDIOGRAM;  Surgeon: Rexene Alberts, MD;  Location: [place];  Service: Open Heart Surgery;  Laterality: N/A;    There were no vitals filed for this visit.  Subjective Assessment - 10/22/19 1511    Subjective  Patient feels a little tired, he golfed today. Marland KitchenHe tried to do his exercises last night, he is sore.    Pertinent History  Patient had bipass surgery and then the parkinsons began 2017. He has a resting tremmor. He does not use a walking device.He plays golf 2-3 times / week. He sometimes has difficulty with yard work, he has back pain that is intermittent.    How long can you sit comfortably?  unlimited    How long can you stand comfortably?  30 minutes    How long can you walk comfortably?  30 minutes level    Currently in Pain?  No/denies    Pain Score  0-No pain        Treatment:   Patient seen for LSVT Daily Session Maximal Daily Exercises for facilitation/coordination of movement Sustained movements are designed to rescale the amplitude of  movement output for generalization to daily functional activities .Performed as follows for 1 set of 10 repetitions each multidirectional sustained movements  1) Floor to ceiling , cues to hold for 10 seconds,needs modeling for correct positions , VC to reach out further and to reach up to the ceiling 2) Side to side multidirectional Repetitive movements performed in sitting and are designed to provide retraining effort needed for sustained muscle activation in tasks , cues to lean fwd and hold position x 10 counts: Patient does not move fwd<>sidesitting<> to fwd and is not able to transition or stretch out leg very far 3) Step and reach forward , cues for good knee flex, cues to move UE and LE together, cues to rotate  arms up to pronate his  forearms 4) Step and reach backwards , cues for BUE back, getting toe up and bending back knee 5) Step and reach sideways, cues to turn  head sideways, and turn head back to neutral, and to rotate arms and pronate forearms 6) Rock and reach forward/backward , cues to reach far fwd with UE's, patient not comfortable with feet apart , not rocking very far to get a good weight shift, not able to raise  heel or raise  toes much 7) Rock and reach sideways, cues for twist and look behind , only rotates side ways, unable to twist around or turn to look behind   functional component task with supervision 5 reps and simulated activities for: 1. Sit to stand x 10   needs cues to begin with UE up for correct start position 2.balance task with 1/2 foam and tapping to step x 10 , foam step ups x 10 ,  for being able to ascend and descend a curb safely. 3. Cards shuffling x 3, dealing with wrist rotation from front of deck and back of deck,to be able to manage golf peg for golf ball 4.grooved pegboardr for coordination of fingers and hands,to improve turning pages in a book 5.Pegs in peg boardwith squeeze toolfor coordination of fingers,  to improve finger coordination for using the phone Patient performed with instruction, verbal cues, tactile cues of therapist: goal: increase tissue extensibility, promote proper posture, improve mobility  Pt educated throughout session about proper posture and technique with exercises. Improved exercise technique, movement at target joints, use of target muscles after min to mod verbal, visual, tactile cues.                        PT Education - 10/22/19 1512    Education Details  LSVT BIG    Person(s) Educated  Patient    Methods  Explanation    Comprehension  Verbalized understanding;Returned demonstration;Verbal cues required;Tactile cues required;Need further instruction          PT Long Term Goals - 10/08/19 1727      PT LONG TERM GOAL  #1   Title  Patient will be independent in home exercise program to improve strength/mobility for better functional independence with ADLs.    Time  2    Period  Weeks    Status  New    Target Date  10/22/19      PT LONG TERM GOAL #2   Title  Patient will increase Berg Balance score by > 6 points to demonstrate decreased fall risk during functional activities.    Time  4    Period  Weeks    Status  New    Target Date  11/12/19      PT LONG TERM GOAL #3   Title  Patient will be independent with ascend/descend 12 steps using single UE in step over step pattern without LOB.    Time  4    Period  Weeks    Status  New    Target Date  11/12/19      PT LONG TERM GOAL #4   Title  Patient will have no gait devialtions or path deviations during horizontal or vertical  head turns or gait speed changes.    Time  8    Period  Weeks    Status  New    Target Date  11/12/19            Plan - 10/22/19 1513    Clinical Impression Statement  Patient needs cueing for BIG swing and BIG amplitude.  Patient needs cueing for correct BIG arm swing. Patient improves with practice. Patient needs cuing to consistently swing her arms and also swing reciprocally. Patient needs cuing to swing UE's reciprocally with weight shift exercise and with backward stepping and correct UE positioning.   Stability/Clinical Decision Making  Stable/Uncomplicated    Rehab Potential  Good    PT Frequency  4x / week    PT Duration  4 weeks    PT Treatment/Interventions  Gait training;Therapeutic exercise;Therapeutic activities;Balance training;Neuromuscular re-education;Manual techniques    PT Next Visit Plan  LSVT BIG    PT Home Exercise Plan  LSVT BIG    Consulted and Agree with Plan of Care  Patient       Patient will benefit from skilled therapeutic intervention in order to improve the following deficits and impairments:  Abnormal gait, Decreased activity tolerance, Decreased strength, Difficulty walking,  Decreased balance, Decreased coordination, Decreased mobility, Impaired tone  Visit Diagnosis: Dysphonia  Difficulty in walking, not elsewhere classified  Other abnormalities of gait and mobility     Problem List Patient Active Problem List   Diagnosis Date Noted  . Parkinson's disease ([place]) 04/06/2017  . 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 NSTEMI (non-ST elevated myocardial infarction) ([place]) 03/26/2014  . Acute myocardial infarction, subendocardial infarction ([place]) 03/26/2014  . Atherosclerotic heart disease of native coronary artery with unstable angina pectoris ([place])   . Ischemic cardiomyopathy   . Essential hypertension   . Hyperlipidemia with target LDL less than 70   . Tobacco abuse   . History of colonic polyps 02/26/2013    Alanson Puls, [place] DPT 10/22/2019, 3:15 PM  [place] MAIN [facility] SERVICES [address] [place], [place], [address] Phone: [phone]   Fax:  [phone]  Name: [name] [name] MRN: [id number] Date of Birth: 11/23/1950

## 2019-10-23 ENCOUNTER — Encounter: Payer: Self-pay | Admitting: Physical Therapy

## 2019-10-23 ENCOUNTER — Encounter: Payer: Self-pay | Admitting: Speech Pathology

## 2019-10-23 ENCOUNTER — Ambulatory Visit: Payer: Medicare Other | Admitting: Physical Therapy

## 2019-10-23 ENCOUNTER — Other Ambulatory Visit: Payer: Self-pay

## 2019-10-23 ENCOUNTER — Ambulatory Visit: Payer: Medicare Other | Admitting: Speech Pathology

## 2019-10-23 DIAGNOSIS — R490 Dysphonia: Secondary | ICD-10-CM | POA: Diagnosis not present

## 2019-10-23 DIAGNOSIS — R2689 Other abnormalities of gait and mobility: Secondary | ICD-10-CM

## 2019-10-23 DIAGNOSIS — R262 Difficulty in walking, not elsewhere classified: Secondary | ICD-10-CM

## 2019-10-23 NOTE — Therapy (Signed)
[place] MAIN [facility] SERVICES [address] [place], [place], [address] Phone: [phone]   Fax:  [phone]  Speech Language Pathology Treatment  Patient Details  Name: [name] [name] MRN: [id number] Date of Birth: [birth year] Referring Provider (SLP): Dr. Manuella Ghazi   Encounter Date: 10/22/2019  End of Session - 10/23/19 1006    Visit Number  7    Number of Visits  17    Date for SLP Re-Evaluation  11/08/19    Authorization Type  Medicare    Authorization Time Period  Start 10/08/2019    Authorization - Visit Number  7    Authorization - Number of Visits  10    SLP Start Time  1600    SLP Stop Time   1700    SLP Time Calculation (min)  60 min    Activity Tolerance  Patient tolerated treatment well       Past Medical History:  Diagnosis Date  . Acute non-Q wave ST elevation myocardial infarction (STEMI) 9/13-14/2015  . Atherosclerotic heart disease of native coronary artery with unstable angina pectoris ([place])    a. 03/2014 NSTEMI/Cath: LM nl, LAD 70m (FFR 0.76), D1 13m, LCX 134m, RCA 180m, EF 35%  . Essential hypertension   . GERD (gastroesophageal reflux disease)   . Hyperlipidemia with target LDL less than 70   . Ischemic cardiomyopathy    a. 03/2014 EF 35% by LV gram.  . Peripheral vision loss    right eye   . 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 Tobacco abuse     Past Surgical History:  Procedure Laterality Date  . ADENOIDECTOMY    . CARDIAC CATHETERIZATION     a. 03/2014 NSTEMI/Cath: LM nl, LAD 60m (FFR 0.76), D1 155m, LCX 19m, RCA 147m, EF 35%  . COLONOSCOPY WITH PROPOFOL N/A 05/30/2018   Procedure: COLONOSCOPY WITH PROPOFOL;  Surgeon: Robert Bellow, MD;  Location: [facility];  Service: Endoscopy;  Laterality: N/A;  . CORONARY ARTERY BYPASS GRAFT N/A 03/28/2014   Procedure: CORONARY ARTERY BYPASS GRAFTING (CABG) x  4 using left  internal mammary artery and bilateral saphenous leg vein.;  Surgeon: Rexene Alberts, MD;  Location: [place];  Service: Open Heart Surgery;  Laterality: N/A;  . INTRAOPERATIVE TRANSESOPHAGEAL ECHOCARDIOGRAM N/A 03/28/2014   Procedure: INTRAOPERATIVE TRANSESOPHAGEAL ECHOCARDIOGRAM;  Surgeon: Rexene Alberts, MD;  Location: [place];  Service: Open Heart Surgery;  Laterality: N/A;    There were no vitals filed for this visit.  Subjective Assessment - 10/23/19 1006    Subjective  Pt engaged and working hard, reports that people are not asking his "What?" as often            ADULT SLP TREATMENT - 10/23/19 0001      General Information   Behavior/Cognition  Alert;Cooperative;Pleasant mood    HPI  Parkinson's diease      Treatment Provided   Treatment provided  Cognitive-Linquistic      Pain Assessment   Pain Assessment  No/denies pain      Cognitive-Linquistic Treatment   Treatment focused on  Voice    Skilled Treatment  Daily Task #1 (Maximum sustained "ah"): Average 9 seconds, 85 dB. Daily Task 2 (Maximum fundamental frequency range): Highs: 15 high pitched "ah" (260 Hz). Lows: 15 low pitched "ah" (160 Hz). Daily task #3 (Maximum speech  loudness drill of functional phrases): Average 75 dB.  Hierarchal speech loudness drill: Read sentences, 75 dB.  Generate phrases/sentences to answer questions., 75 dB. Homework: completed.  Off the cuff remarks: average 72 dB.        Assessment / Recommendations / Plan   Plan  Continue with current plan of care      Progression Toward Goals   Progression toward goals  Progressing toward goals       SLP Education - 10/23/19 1006    Education Details  LSVT-LOUD, calibration    Person(s) Educated  Patient    Methods  Explanation    Comprehension  Verbalized understanding         SLP Long Term Goals - 10/09/19 1453      SLP LONG TERM GOAL #1   Title  The patient will complete Daily Tasks (Maximum duration "ah", High/Lows, and Functional  Phrases) at average loudness of 80 dB and with loud, good quality voice.    Time  4    Period  Weeks    Status  New    Target Date  11/08/19      SLP LONG TERM GOAL #2   Title  The patient will complete Hierarchal Speech Loudness reading drills (words/phrases, sentences, and paragraph) at average 75 dB and with loud, good quality voice.    Time  4    Period  Weeks    Status  New    Target Date  11/08/19      SLP LONG TERM GOAL #3   Title  The patient will participate in conversation, maintaining average loudness of 75 dB and loud, good quality voice.    Time  4    Period  Days    Status  New    Target Date  11/08/19      SLP LONG TERM GOAL #4   Title  The patient will complete homework daily.    Time  4    Period  Weeks    Status  New    Target Date  11/08/19       Plan - 10/23/19 1007    Clinical Impression Statement  The patient is completing daily tasks and hierarchal speech drill tasks with loud, good quality voice with minimal shaping required for loudness, quality, and pitch range. He requires significant cues/shaping for duration, however he increased his time by 40% today. His off the cuff remarks remain significantly louder.    Speech Therapy Frequency  4x / week    Duration  4 weeks    Treatment/Interventions  SLP instruction and feedback;Patient/family education    Potential to Achieve Goals  Good    Potential Considerations  Ability to learn/carryover information;Previous level of function;Co-morbidities;Severity of impairments;Cooperation/participation level;Medical prognosis;Family/community support    SLP Home Exercise Plan  LSVT-LOUD Daily Homework    Consulted and Agree with Plan of Care  Patient       Patient will benefit from skilled therapeutic intervention in order to improve the following deficits and impairments:   Dysphonia    Problem List Patient Active Problem List   Diagnosis Date Noted  . Parkinson's disease ([place]) 04/06/2017  . S/P CABG x  4 03/28/2014  . NSTEMI (non-ST elevated myocardial infarction) ([place]) 03/26/2014  . Acute myocardial infarction, subendocardial infarction ([place]) 03/26/2014  . Atherosclerotic heart disease of native coronary artery with unstable angina pectoris ([place])   . Ischemic cardiomyopathy   . Essential hypertension   . Hyperlipidemia with target LDL  less than 70   . Tobacco abuse   . History of colonic polyps 02/26/2013   Leroy Sea, MS/CCC- SLP  Lou Miner 10/23/2019, 10:08 AM  [place] MAIN [facility] SERVICES [address] [place], [place], [address] Phone: [phone]   Fax:  [phone]   Name: [name] [name] MRN: [id number] Date of Birth: 09-Mar-1951

## 2019-10-23 NOTE — Therapy (Addendum)
[place] MAIN [facility] SERVICES [address] [place], [place], [address] Phone: [phone]   Fax:  [phone]  Physical Therapy Treatment  Patient Details  Name: [name] [name] MRN: [id number] Date of Birth: 01-28-1951 Referring Provider (PT): Jennings Books   Encounter Date: 10/23/2019  PT End of Session - 10/23/19 1504    Visit Number  7    Number of Visits  17    Date for PT Re-Evaluation  11/12/19    PT Start Time  0255    PT Stop Time  0350    PT Time Calculation (min)  55 min    Equipment Utilized During Treatment  Gait belt    Activity Tolerance  Patient tolerated treatment well    Behavior During Therapy  [facility] for tasks assessed/performed       Past Medical History:  Diagnosis Date  . Acute non-Q wave ST elevation myocardial infarction (STEMI) 9/13-14/2015  . Atherosclerotic heart disease of native coronary artery with unstable angina pectoris ([place])    a. 03/2014 NSTEMI/Cath: LM nl, LAD 54m (FFR 0.76), D1 162m, LCX 141m, RCA 117m, EF 35%  . Essential hypertension   . GERD (gastroesophageal reflux disease)   . Hyperlipidemia with target LDL less than 70   . Ischemic cardiomyopathy    a. 03/2014 EF 35% by LV gram.  . Peripheral vision loss    right eye   . 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 Tobacco abuse     Past Surgical History:  Procedure Laterality Date  . ADENOIDECTOMY    . CARDIAC CATHETERIZATION     a. 03/2014 NSTEMI/Cath: LM nl, LAD 51m (FFR 0.76), D1 151m, LCX 139m, RCA 157m, EF 35%  . COLONOSCOPY WITH PROPOFOL N/A 05/30/2018   Procedure: COLONOSCOPY WITH PROPOFOL;  Surgeon: Robert Bellow, MD;  Location: [facility];  Service: Endoscopy;  Laterality: N/A;  . CORONARY ARTERY BYPASS GRAFT N/A 03/28/2014   Procedure: CORONARY ARTERY BYPASS GRAFTING (CABG) x  4 using left internal mammary artery and bilateral saphenous leg vein.;   Surgeon: Rexene Alberts, MD;  Location: [place];  Service: Open Heart Surgery;  Laterality: N/A;  . INTRAOPERATIVE TRANSESOPHAGEAL ECHOCARDIOGRAM N/A 03/28/2014   Procedure: INTRAOPERATIVE TRANSESOPHAGEAL ECHOCARDIOGRAM;  Surgeon: Rexene Alberts, MD;  Location: [place];  Service: Open Heart Surgery;  Laterality: N/A;    There were no vitals filed for this visit.  Subjective Assessment - 10/23/19 1503    Subjective  .He tried to do his exercises last night, he is sore.    Currently in Pain?  No/denies    Pain Score  0-No pain        Treatment:   Patient seen for LSVT Daily Session Maximal Daily Exercises for facilitation/coordination of movement Sustained movements are designed to rescale the amplitude of movement output for generalization to daily functional activities .Performed as follows for 1 set of 10 repetitions each multidirectional sustained movements  1) Floor to ceiling , cues to hold for 10 seconds,needs modeling for correct positions , VC to reach out further and to reach up to the ceiling 2) Side to side multidirectional Repetitive movements performed in sitting and are designed to provide retraining effort needed for sustained muscle activation in tasks , cues to lean fwd and hold position x 10 counts: Patient does not move fwd<>sidesitting<> to fwd  and is not able to transition or stretch out leg very far 3) Step and reach forward , cues for good knee flex, cues to move UE and LE together, cues to rotate  arms up to pronate his forearms 4) Step and reach backwards , cues for BUE back, getting toe up and bending back knee 5) Step and reach sideways, cues to turn  head sideways, and turn head back to neutral, and to rotate arms and pronate forearms 6) Rock and reach forward/backward , cues to reach far fwd with UE's, patient not comfortable with feet apart , not rocking very far to get a good weight shift, not able to raise  heel or raise  toes much 7) Rock and reach sideways, cues  for twist and look behind , only rotates side ways, unable to twist around or turn to look behind   functional component task with supervision 5 reps and simulated activities for: 1. Sit to stand x 10   needs cues to begin with UE up for correct start position 2.balance task with 1/2 foam and tapping to step x 10 , foam step ups x 10 for being able to ascend and descend a curb safely. 3. Cards shuffling x 3, dealing with wrist rotation from front of deck and back of deck, to be able to manage golf peg for golf ball 4.grooved pegboardr for coordination of fingers and hands, to improve turning pages in a book 5.Pegs in peg boardwith squeeze toolfor coordination of fingers, to improve finger coordination for using the phone Patient performed with instruction, verbal cues, tactile cues of therapist: goal: increase tissue extensibility, promote proper posture, improve mobility  Pt educated throughout session about proper posture and technique with exercises. Improved exercise technique, movement at target joints, use of target muscles after min to mod verbal, visual, tactile cues.                        PT Education - 10/23/19 1503    Education Details  LSVT BIG    Person(s) Educated  Patient    Methods  Explanation    Comprehension  Verbalized understanding;Returned demonstration;Need further instruction          PT Long Term Goals - 10/08/19 1727      PT LONG TERM GOAL #1   Title  Patient will be independent in home exercise program to improve strength/mobility for better functional independence with ADLs.    Time  2    Period  Weeks    Status  New    Target Date  10/22/19      PT LONG TERM GOAL #2   Title  Patient will increase Berg Balance score by > 6 points to demonstrate decreased fall risk during functional activities.    Time  4    Period  Weeks    Status  New    Target Date  11/12/19      PT LONG TERM GOAL #3   Title  Patient will be independent  with ascend/descend 12 steps using single UE in step over step pattern without LOB.    Time  4    Period  Weeks    Status  New    Target Date  11/12/19      PT LONG TERM GOAL #4   Title  Patient will have no gait devialtions or path deviations during horizontal or vertical  head turns or gait speed changes.    Time  8    Period  Weeks    Status  New    Target Date  11/12/19            Plan - 10/23/19 1504    Clinical Impression Statement  p    Stability/Clinical Decision Making  Stable/Uncomplicated    Rehab Potential  Good    PT Frequency  4x / week    PT Duration  4 weeks    PT Treatment/Interventions  Gait training;Therapeutic exercise;Therapeutic activities;Balance training;Neuromuscular re-education;Manual techniques    PT Next Visit Plan  LSVT BIG    PT Home Exercise Plan  LSVT BIG    Consulted and Agree with Plan of Care  Patient       Patient will benefit from skilled therapeutic intervention in order to improve the following deficits and impairments:  Abnormal gait, Decreased activity tolerance, Decreased strength, Difficulty walking, Decreased balance, Decreased coordination, Decreased mobility, Impaired tone  Visit Diagnosis: Difficulty in walking, not elsewhere classified  Other abnormalities of gait and mobility     Problem List Patient Active Problem List   Diagnosis Date Noted  . Parkinson's disease ([place]) 04/06/2017  . 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 NSTEMI (non-ST elevated myocardial infarction) ([place]) 03/26/2014  . Acute myocardial infarction, subendocardial infarction ([place]) 03/26/2014  . Atherosclerotic heart disease of native coronary artery with unstable angina pectoris ([place])   . Ischemic cardiomyopathy   . Essential hypertension   . Hyperlipidemia with target LDL less than 70   . Tobacco abuse   . History of colonic polyps 02/26/2013    Alanson Puls, [place] DPT 10/23/2019, 3:05 PM  [place] MAIN [facility]  SERVICES [address] [place], [place], [address] Phone: [phone]   Fax:  [phone]  Name: [name] [name] MRN: [id number] Date of Birth: [birth year]

## 2019-10-24 ENCOUNTER — Other Ambulatory Visit: Payer: Self-pay

## 2019-10-24 ENCOUNTER — Ambulatory Visit: Payer: Medicare Other | Admitting: Physical Therapy

## 2019-10-24 ENCOUNTER — Encounter: Payer: Self-pay | Admitting: Speech Pathology

## 2019-10-24 ENCOUNTER — Ambulatory Visit: Payer: Medicare Other | Admitting: Speech Pathology

## 2019-10-24 DIAGNOSIS — R490 Dysphonia: Secondary | ICD-10-CM

## 2019-10-24 DIAGNOSIS — R2689 Other abnormalities of gait and mobility: Secondary | ICD-10-CM

## 2019-10-24 DIAGNOSIS — R262 Difficulty in walking, not elsewhere classified: Secondary | ICD-10-CM

## 2019-10-24 NOTE — Therapy (Signed)
[place] MAIN [facility] SERVICES [address] [place], [place], [address] Phone: [phone]   Fax:  [phone]  Physical Therapy Treatment  Patient Details  Name: AURION RISI MRN: [id number] Date of Birth: 1950-07-16 Referring Provider (PT): Jennings Books   Encounter Date: 10/24/2019  PT End of Session - 10/24/19 1556    Visit Number  8    Number of Visits  17    Date for PT Re-Evaluation  11/12/19    PT Start Time  0300    PT Stop Time  0355    PT Time Calculation (min)  55 min    Equipment Utilized During Treatment  Gait belt    Activity Tolerance  Patient tolerated treatment well    Behavior During Therapy  WFL for tasks assessed/performed       Past Medical History:  Diagnosis Date  . Acute non-Q wave ST elevation myocardial infarction (STEMI) 9/13-14/2015  . Atherosclerotic heart disease of native coronary artery with unstable angina pectoris ([place])    a. 03/2014 NSTEMI/Cath: LM nl, LAD 34m (FFR 0.76), D1 164m, LCX 134m, RCA 137m, EF 35%  . Essential hypertension   . GERD (gastroesophageal reflux disease)   . Hyperlipidemia with target LDL less than 70   . Ischemic cardiomyopathy    a. 03/2014 EF 35% by LV gram.  . Peripheral vision loss    right eye   . 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 Tobacco abuse     Past Surgical History:  Procedure Laterality Date  . ADENOIDECTOMY    . CARDIAC CATHETERIZATION     a. 03/2014 NSTEMI/Cath: LM nl, LAD 85m (FFR 0.76), D1 158m, LCX 16m, RCA 13m, EF 35%  . COLONOSCOPY WITH PROPOFOL N/A 05/30/2018   Procedure: COLONOSCOPY WITH PROPOFOL;  Surgeon: Robert Bellow, MD;  Location: [facility];  Service: Endoscopy;  Laterality: N/A;  . CORONARY ARTERY BYPASS GRAFT N/A 03/28/2014   Procedure: CORONARY ARTERY BYPASS GRAFTING (CABG) x  4 using left internal mammary artery and bilateral saphenous leg vein.;   Surgeon: Rexene Alberts, MD;  Location: [place];  Service: Open Heart Surgery;  Laterality: N/A;  . INTRAOPERATIVE TRANSESOPHAGEAL ECHOCARDIOGRAM N/A 03/28/2014   Procedure: INTRAOPERATIVE TRANSESOPHAGEAL ECHOCARDIOGRAM;  Surgeon: Rexene Alberts, MD;  Location: [place];  Service: Open Heart Surgery;  Laterality: N/A;    There were no vitals filed for this visit.  Subjective Assessment - 10/24/19 1555    Subjective  .He tried to do his exercises last night, he is sore.    Pertinent History  Patient had bipass surgery and then the parkinsons began 2017. He has a resting tremmor. He does not use a walking device.He plays golf 2-3 times / week. He sometimes has difficulty with yard work, he has back pain that is intermittent.    How long can you sit comfortably?  unlimited    How long can you stand comfortably?  30 minutes    How long can you walk comfortably?  30 minutes level    Currently in Pain?  No/denies    Pain Score  0-No pain          Treatment:   Patient seen for LSVT Daily Session Maximal Daily Exercises for facilitation/coordination of movement Sustained movements are designed to rescale the amplitude of movement output for generalization to daily  functional activities .Performed as follows for 1 set of 10 repetitions each multidirectional sustained movements  1) Floor to ceiling , cues to hold for 10 seconds,needs modeling for correct positions , VC to reach out further and to reach up to the ceiling 2) Side to side multidirectional Repetitive movements performed in sitting and are designed to provide retraining effort needed for sustained muscle activation in tasks , cues to lean fwd and hold position x 10 counts: Patient does not move fwd<>sidesitting<> to fwd and is not able to transition or stretch out leg very far 3) Step and reach forward , cues for good knee flex, cues to move UE and LE together, cues to rotate  arms up to pronate his forearms 4) Step and reach backwards , cues  for BUE back, getting toe up and bending back knee 5) Step and reach sideways, cues to turn  head sideways, and turn head back to neutral, and to rotate arms and pronate forearms 6) Rock and reach forward/backward , cues to reach far fwd with UE's, patient not comfortable with feet apart , not rocking very far to get a good weight shift, not able to raise  heel or raise  toes much 7) Rock and reach sideways, cues for twist and look behind , only rotates side ways, unable to twist around or turn to look behind   functional component task with supervision 5 reps and simulated activities for: 1. Sit to stand x 10   needs cues to begin with UE up for correct start position 2.balance task with 1/2 foam and tapping to step x 10 , foam step ups x 10 for being able to ascend and descend a curb safely. 3. Cards shuffling x 3, dealing with wrist rotation from front of deck and back of deck, to be able to manage golf peg for golf ball 4.grooved pegboardr for coordination of fingers and hands, to improve turning pages in a book 5.Pegs in peg boardwith squeeze toolfor coordination of fingers, to improve finger coordination for using the phone Patient performed with instruction, verbal cues, tactile cues of therapist: goal: increase tissue extensibility, promote proper posture, improve mobility  Ambulation with BIG arm swing 1000 feet x 3 with cues for reciprocal arm swing   Pt educated throughout session about proper posture and technique with exercises. Improved exercise technique, movement at target joints, use of target muscles after min to mod verbal, visual, tactile cues.                       PT Education - 10/24/19 1555    Education Details  LSVT BIG    Person(s) Educated  Patient    Methods  Explanation    Comprehension  Verbalized understanding;Returned demonstration;Verbal cues required;Need further instruction          PT Long Term Goals - 10/08/19 1727      PT  LONG TERM GOAL #1   Title  Patient will be independent in home exercise program to improve strength/mobility for better functional independence with ADLs.    Time  2    Period  Weeks    Status  New    Target Date  10/22/19      PT LONG TERM GOAL #2   Title  Patient will increase Berg Balance score by > 6 points to demonstrate decreased fall risk during functional activities.    Time  4    Period  Weeks    Status  New    Target Date  11/12/19      PT LONG TERM GOAL #3   Title  Patient will be independent with ascend/descend 12 steps using single UE in step over step pattern without LOB.    Time  4    Period  Weeks    Status  New    Target Date  11/12/19      PT LONG TERM GOAL #4   Title  Patient will have no gait devialtions or path deviations during horizontal or vertical  head turns or gait speed changes.    Time  8    Period  Weeks    Status  New    Target Date  11/12/19            Plan - 10/24/19 1556    Clinical Impression Statement  Patient has unsteady stepping with several exercises, but his motor control improves with practice. Patient needs occasional verbal cueing to improve posture and cueing to correctly perform exercises slowly, holding at end of range to increase motor firing of desired muscle to encourage fatigue. Patients performance improves with practice and uses UE to help support and for balance.   Stability/Clinical Decision Making  Stable/Uncomplicated    Rehab Potential  Good    PT Frequency  4x / week    PT Duration  4 weeks    PT Treatment/Interventions  Gait training;Therapeutic exercise;Therapeutic activities;Balance training;Neuromuscular re-education;Manual techniques    PT Next Visit Plan  LSVT BIG    PT Home Exercise Plan  LSVT BIG    Consulted and Agree with Plan of Care  Patient       Patient will benefit from skilled therapeutic intervention in order to improve the following deficits and impairments:  Abnormal gait, Decreased activity  tolerance, Decreased strength, Difficulty walking, Decreased balance, Decreased coordination, Decreased mobility, Impaired tone  Visit Diagnosis: Difficulty in walking, not elsewhere classified  Other abnormalities of gait and mobility     Problem List Patient Active Problem List   Diagnosis Date Noted  . Parkinson's disease ([place]) 04/06/2017  . 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 NSTEMI (non-ST elevated myocardial infarction) ([place]) 03/26/2014  . Acute myocardial infarction, subendocardial infarction ([place]) 03/26/2014  . Atherosclerotic heart disease of native coronary artery with unstable angina pectoris ([place])   . Ischemic cardiomyopathy   . Essential hypertension   . Hyperlipidemia with target LDL less than 70   . Tobacco abuse   . History of colonic polyps 02/26/2013    Alanson Puls, [place] DPT 10/24/2019, 3:57 PM  [place] MAIN [facility] SERVICES [address] [place], [place], [address] Phone: [phone]   Fax:  [phone]  Name: DRACO DONATE MRN: [id number] Date of Birth: 1951/07/03

## 2019-10-24 NOTE — Therapy (Signed)
[place] MAIN [facility] SERVICES [address] [place], [place], [address] Phone: [phone]   Fax:  [phone]  Speech Language Pathology Treatment  Patient Details  Name: [name] [name] MRN: [id number] Date of Birth: May 20, 1951 Referring Provider (SLP): Dr. Manuella Ghazi   Encounter Date: 10/23/2019  End of Session - 10/24/19 0930    Visit Number  8    Number of Visits  17    Date for SLP Re-Evaluation  11/08/19    Authorization Type  Medicare    Authorization Time Period  Start 10/08/2019    Authorization - Visit Number  8    Authorization - Number of Visits  10    SLP Start Time  1600    SLP Stop Time   1700    SLP Time Calculation (min)  60 min    Activity Tolerance  Patient tolerated treatment well       Past Medical History:  Diagnosis Date  . Acute non-Q wave ST elevation myocardial infarction (STEMI) 9/13-14/2015  . Atherosclerotic heart disease of native coronary artery with unstable angina pectoris ([place])    a. 03/2014 NSTEMI/Cath: LM nl, LAD 52m (FFR 0.76), D1 174m, LCX 122m, RCA 158m, EF 35%  . Essential hypertension   . GERD (gastroesophageal reflux disease)   . Hyperlipidemia with target LDL less than 70   . Ischemic cardiomyopathy    a. 03/2014 EF 35% by LV gram.  . Peripheral vision loss    right eye   . 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 Tobacco abuse     Past Surgical History:  Procedure Laterality Date  . ADENOIDECTOMY    . CARDIAC CATHETERIZATION     a. 03/2014 NSTEMI/Cath: LM nl, LAD 63m (FFR 0.76), D1 114m, LCX 163m, RCA 185m, EF 35%  . COLONOSCOPY WITH PROPOFOL N/A 05/30/2018   Procedure: COLONOSCOPY WITH PROPOFOL;  Surgeon: Robert Bellow, MD;  Location: [facility];  Service: Endoscopy;  Laterality: N/A;  . CORONARY ARTERY BYPASS GRAFT N/A 03/28/2014   Procedure: CORONARY ARTERY BYPASS GRAFTING (CABG) x  4 using left  internal mammary artery and bilateral saphenous leg vein.;  Surgeon: Rexene Alberts, MD;  Location: [place];  Service: Open Heart Surgery;  Laterality: N/A;  . INTRAOPERATIVE TRANSESOPHAGEAL ECHOCARDIOGRAM N/A 03/28/2014   Procedure: INTRAOPERATIVE TRANSESOPHAGEAL ECHOCARDIOGRAM;  Surgeon: Rexene Alberts, MD;  Location: [place];  Service: Open Heart Surgery;  Laterality: N/A;    There were no vitals filed for this visit.  Subjective Assessment - 10/24/19 0930    Subjective  Pt engaged and working hard, reports that people are not asking his "What?" as often            ADULT SLP TREATMENT - 10/24/19 0001      General Information   Behavior/Cognition  Alert;Cooperative;Pleasant mood    HPI  Parkinson's diease      Treatment Provided   Treatment provided  Cognitive-Linquistic      Pain Assessment   Pain Assessment  No/denies pain      Cognitive-Linquistic Treatment   Treatment focused on  Voice    Skilled Treatment  Daily Task #1 (Maximum sustained "ah"): Average 9 seconds, 85 dB. Daily Task 2 (Maximum fundamental frequency range): Highs: 15 high pitched "ah" (260 Hz). Lows: 15 low pitched "ah" (155 Hz). Daily task #3 (Maximum speech  loudness drill of functional phrases): Average 75 dB.  Hierarchal speech loudness drill: Read sentences, 75 dB.  Generate phrases/sentences to describe pictured household hazards, 75 dB. Homework: completed.  Off the cuff remarks: average 72 dB.        Assessment / Recommendations / Plan   Plan  Continue with current plan of care      Progression Toward Goals   Progression toward goals  Progressing toward goals       SLP Education - 10/24/19 0930    Education Details  LSVT-LOUD, callibration    Person(s) Educated  Patient    Methods  Explanation    Comprehension  Verbalized understanding         SLP Long Term Goals - 10/09/19 1453      SLP LONG TERM GOAL #1   Title  The patient will complete Daily Tasks (Maximum duration "ah", High/Lows,  and Functional Phrases) at average loudness of 80 dB and with loud, good quality voice.    Time  4    Period  Weeks    Status  New    Target Date  11/08/19      SLP LONG TERM GOAL #2   Title  The patient will complete Hierarchal Speech Loudness reading drills (words/phrases, sentences, and paragraph) at average 75 dB and with loud, good quality voice.    Time  4    Period  Weeks    Status  New    Target Date  11/08/19      SLP LONG TERM GOAL #3   Title  The patient will participate in conversation, maintaining average loudness of 75 dB and loud, good quality voice.    Time  4    Period  Days    Status  New    Target Date  11/08/19      SLP LONG TERM GOAL #4   Title  The patient will complete homework daily.    Time  4    Period  Weeks    Status  New    Target Date  11/08/19       Plan - 10/24/19 0931    Clinical Impression Statement  The patient is completing daily tasks and hierarchal speech drill tasks with loud, good quality voice with minimal shaping required for loudness, quality, and pitch range. He requires significant cues/shaping for duration, however he increased his time by 40% today. His off the cuff remarks remain significantly louder.    Speech Therapy Frequency  4x / week    Duration  4 weeks    Treatment/Interventions  SLP instruction and feedback;Patient/family education    Potential to Achieve Goals  Good    Potential Considerations  Ability to learn/carryover information;Previous level of function;Co-morbidities;Severity of impairments;Cooperation/participation level;Medical prognosis;Family/community support    SLP Home Exercise Plan  LSVT-LOUD Daily Homework    Consulted and Agree with Plan of Care  Patient       Patient will benefit from skilled therapeutic intervention in order to improve the following deficits and impairments:   Dysphonia    Problem List Patient Active Problem List   Diagnosis Date Noted  . Parkinson's disease ([place]) 04/06/2017   . 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 NSTEMI (non-ST elevated myocardial infarction) ([place]) 03/26/2014  . Acute myocardial infarction, subendocardial infarction ([place]) 03/26/2014  . Atherosclerotic heart disease of native coronary artery with unstable angina pectoris ([place])   . Ischemic cardiomyopathy   . Essential hypertension   . Hyperlipidemia with  target LDL less than 70   . Tobacco abuse   . History of colonic polyps 02/26/2013   Leroy Sea, MS/CCC- SLP  Lou Miner 10/24/2019, 9:32 AM  [place] MAIN [facility] SERVICES [address] [place], [place], [address] Phone: [phone]   Fax:  [phone]   Name: NEITHAN GEHR MRN: [id number] Date of Birth: 1951-01-27

## 2019-10-25 ENCOUNTER — Encounter: Payer: Self-pay | Admitting: Speech Pathology

## 2019-10-25 NOTE — Therapy (Signed)
[place] MAIN [facility] SERVICES [address] [place], [place], [address] Phone: [phone]   Fax:  [phone]  Speech Language Pathology Treatment  Patient Details  Name: [name] [name] MRN: [id number] Date of Birth: Jan 11, 1951 Referring Provider (SLP): Dr. Manuella Ghazi   Encounter Date: 10/24/2019  End of Session - 10/25/19 0850    Visit Number  9    Number of Visits  17    Date for SLP Re-Evaluation  11/08/19    Authorization Type  Medicare    Authorization Time Period  Start 10/08/2019    Authorization - Visit Number  9    Authorization - Number of Visits  10    SLP Start Time  1600    SLP Stop Time   1700    SLP Time Calculation (min)  60 min    Activity Tolerance  Patient tolerated treatment well       Past Medical History:  Diagnosis Date  . Acute non-Q wave ST elevation myocardial infarction (STEMI) 9/13-14/2015  . Atherosclerotic heart disease of native coronary artery with unstable angina pectoris ([place])    a. 03/2014 NSTEMI/Cath: LM nl, LAD 75m (FFR 0.76), D1 165m, LCX 160m, RCA 164m, EF 35%  . Essential hypertension   . GERD (gastroesophageal reflux disease)   . Hyperlipidemia with target LDL less than 70   . Ischemic cardiomyopathy    a. 03/2014 EF 35% by LV gram.  . Peripheral vision loss    right eye   . 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 Tobacco abuse     Past Surgical History:  Procedure Laterality Date  . ADENOIDECTOMY    . CARDIAC CATHETERIZATION     a. 03/2014 NSTEMI/Cath: LM nl, LAD 94m (FFR 0.76), D1 139m, LCX 179m, RCA 129m, EF 35%  . COLONOSCOPY WITH PROPOFOL N/A 05/30/2018   Procedure: COLONOSCOPY WITH PROPOFOL;  Surgeon: Robert Bellow, MD;  Location: [facility];  Service: Endoscopy;  Laterality: N/A;  . CORONARY ARTERY BYPASS GRAFT N/A 03/28/2014   Procedure: CORONARY ARTERY BYPASS GRAFTING (CABG) x  4 using left  internal mammary artery and bilateral saphenous leg vein.;  Surgeon: Rexene Alberts, MD;  Location: [place];  Service: Open Heart Surgery;  Laterality: N/A;  . INTRAOPERATIVE TRANSESOPHAGEAL ECHOCARDIOGRAM N/A 03/28/2014   Procedure: INTRAOPERATIVE TRANSESOPHAGEAL ECHOCARDIOGRAM;  Surgeon: Rexene Alberts, MD;  Location: [place];  Service: Open Heart Surgery;  Laterality: N/A;    There were no vitals filed for this visit.  Subjective Assessment - 10/25/19 0849    Subjective  Pt engaged and working hard, reports that people are not asking his "What?" as often            ADULT SLP TREATMENT - 10/25/19 0001      General Information   Behavior/Cognition  Alert;Cooperative;Pleasant mood    HPI  Parkinson's diease      Treatment Provided   Treatment provided  Cognitive-Linquistic      Pain Assessment   Pain Assessment  No/denies pain      Cognitive-Linquistic Treatment   Treatment focused on  Voice    Skilled Treatment  Daily Task #1 (Maximum sustained "ah"): Average 8 seconds, 85 dB. Daily Task 2 (Maximum fundamental frequency range): Highs: 15 high pitched "ah" (270 Hz). Lows: 15 low pitched "ah" (150 Hz). Daily task #3 (Maximum speech  loudness drill of functional phrases): Average 75 dB.  Hierarchal speech loudness drill: Read sentences, 75 dB.  Generate phrases/sentences to answer miscellaneous questions, 75 dB. Homework: completed.  Off the cuff remarks: average 72 dB.        Assessment / Recommendations / Plan   Plan  Continue with current plan of care      Progression Toward Goals   Progression toward goals  Progressing toward goals       SLP Education - 10/25/19 0850    Education Details  LSVT-LOUD, duration    Person(s) Educated  Patient    Methods  Explanation    Comprehension  Verbalized understanding         SLP Long Term Goals - 10/09/19 1453      SLP LONG TERM GOAL #1   Title  The patient will complete Daily Tasks (Maximum duration "ah", High/Lows, and  Functional Phrases) at average loudness of 80 dB and with loud, good quality voice.    Time  4    Period  Weeks    Status  New    Target Date  11/08/19      SLP LONG TERM GOAL #2   Title  The patient will complete Hierarchal Speech Loudness reading drills (words/phrases, sentences, and paragraph) at average 75 dB and with loud, good quality voice.    Time  4    Period  Weeks    Status  New    Target Date  11/08/19      SLP LONG TERM GOAL #3   Title  The patient will participate in conversation, maintaining average loudness of 75 dB and loud, good quality voice.    Time  4    Period  Days    Status  New    Target Date  11/08/19      SLP LONG TERM GOAL #4   Title  The patient will complete homework daily.    Time  4    Period  Weeks    Status  New    Target Date  11/08/19       Plan - 10/25/19 0850    Clinical Impression Statement  The patient is completing daily tasks and hierarchal speech drill tasks with loud, good quality voice with minimal shaping required for loudness, quality, and pitch range. He requires significant cues/shaping for duration, however he increased his time by 40% today. His off the cuff remarks remain significantly louder.    Speech Therapy Frequency  4x / week    Duration  4 weeks    Treatment/Interventions  SLP instruction and feedback;Patient/family education    Potential to Achieve Goals  Good    Potential Considerations  Ability to learn/carryover information;Previous level of function;Co-morbidities;Severity of impairments;Cooperation/participation level;Medical prognosis;Family/community support    SLP Home Exercise Plan  LSVT-LOUD Daily Homework    Consulted and Agree with Plan of Care  Patient       Patient will benefit from skilled therapeutic intervention in order to improve the following deficits and impairments:   Dysphonia    Problem List Patient Active Problem List   Diagnosis Date Noted  . Parkinson's disease ([place]) 04/06/2017  .  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 NSTEMI (non-ST elevated myocardial infarction) ([place]) 03/26/2014  . Acute myocardial infarction, subendocardial infarction ([place]) 03/26/2014  . Atherosclerotic heart disease of native coronary artery with unstable angina pectoris ([place])   . Ischemic cardiomyopathy   . Essential hypertension   . Hyperlipidemia with target  LDL less than 70   . Tobacco abuse   . History of colonic polyps 02/26/2013   Leroy Sea, MS/CCC- SLP  Lou Miner 10/25/2019, 8:51 AM  [place] MAIN [facility] SERVICES [address] [place], [place], [address] Phone: [phone]   Fax:  [phone]   Name: [name] [name] MRN: [id number] Date of Birth: 16-Jun-1951

## 2019-10-28 ENCOUNTER — Ambulatory Visit: Payer: Medicare Other | Admitting: Speech Pathology

## 2019-10-28 ENCOUNTER — Ambulatory Visit: Payer: Medicare Other | Admitting: Physical Therapy

## 2019-10-28 ENCOUNTER — Other Ambulatory Visit: Payer: Self-pay

## 2019-10-28 ENCOUNTER — Encounter: Payer: Self-pay | Admitting: Physical Therapy

## 2019-10-28 DIAGNOSIS — R2689 Other abnormalities of gait and mobility: Secondary | ICD-10-CM

## 2019-10-28 DIAGNOSIS — R490 Dysphonia: Secondary | ICD-10-CM | POA: Diagnosis not present

## 2019-10-28 DIAGNOSIS — R262 Difficulty in walking, not elsewhere classified: Secondary | ICD-10-CM

## 2019-10-28 NOTE — Therapy (Signed)
[place] MAIN [facility] SERVICES [address] [place], [place], [address] Phone: [phone]   Fax:  [phone]  Physical Therapy Treatment  Patient Details  Name: NYRELL KNEELAND MRN: [id number] Date of Birth: 1950-12-11 Referring Provider (PT): Jennings Books   Encounter Date: 10/28/2019  PT End of Session - 10/28/19 1505    Visit Number  9    Number of Visits  17    Date for PT Re-Evaluation  11/12/19    PT Start Time  0300    PT Stop Time  0355    PT Time Calculation (min)  55 min    Equipment Utilized During Treatment  Gait belt    Activity Tolerance  Patient tolerated treatment well    Behavior During Therapy  WFL for tasks assessed/performed       Past Medical History:  Diagnosis Date  . Acute non-Q wave ST elevation myocardial infarction (STEMI) 9/13-14/2015  . Atherosclerotic heart disease of native coronary artery with unstable angina pectoris ([place])    a. 03/2014 NSTEMI/Cath: LM nl, LAD 54m (FFR 0.76), D1 157m, LCX 170m, RCA 153m, EF 35%  . Essential hypertension   . GERD (gastroesophageal reflux disease)   . Hyperlipidemia with target LDL less than 70   . Ischemic cardiomyopathy    a. 03/2014 EF 35% by LV gram.  . Peripheral vision loss    right eye   . 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 Tobacco abuse     Past Surgical History:  Procedure Laterality Date  . ADENOIDECTOMY    . CARDIAC CATHETERIZATION     a. 03/2014 NSTEMI/Cath: LM nl, LAD 14m (FFR 0.76), D1 110m, LCX 144m, RCA 160m, EF 35%  . COLONOSCOPY WITH PROPOFOL N/A 05/30/2018   Procedure: COLONOSCOPY WITH PROPOFOL;  Surgeon: Robert Bellow, MD;  Location: [facility];  Service: Endoscopy;  Laterality: N/A;  . CORONARY ARTERY BYPASS GRAFT N/A 03/28/2014   Procedure: CORONARY ARTERY BYPASS GRAFTING (CABG) x  4 using left internal mammary artery and bilateral saphenous leg vein.;   Surgeon: Rexene Alberts, MD;  Location: [place];  Service: Open Heart Surgery;  Laterality: N/A;  . INTRAOPERATIVE TRANSESOPHAGEAL ECHOCARDIOGRAM N/A 03/28/2014   Procedure: INTRAOPERATIVE TRANSESOPHAGEAL ECHOCARDIOGRAM;  Surgeon: Rexene Alberts, MD;  Location: [place];  Service: Open Heart Surgery;  Laterality: N/A;    There were no vitals filed for this visit.  Subjective Assessment - 10/28/19 1504    Subjective  .He did his exercises last night, he also played golf today    Pertinent History  Patient had bipass surgery and then the parkinsons began 2017. He has a resting tremmor. He does not use a walking device.He plays golf 2-3 times / week. He sometimes has difficulty with yard work, he has back pain that is intermittent.    How long can you sit comfortably?  unlimited    How long can you stand comfortably?  30 minutes    How long can you walk comfortably?  30 minutes level    Currently in Pain?  No/denies    Pain Score  0-No pain          Treatment:   Patient seen for LSVT Daily Session Maximal Daily Exercises for facilitation/coordination of movement Sustained movements are designed to rescale the amplitude of movement output for generalization to daily  functional activities .Performed as follows for 1 set of 10 repetitions each multidirectional sustained movements  1) Floor to ceiling , cues to hold for 10 seconds,needs modeling for correct positions , VC to reach out further and to reach up to the ceiling 2) Side to side multidirectional Repetitive movements performed in sitting and are designed to provide retraining effort needed for sustained muscle activation in tasks , cues to lean fwd and hold position x 10 counts: Patient does not move fwd<>sidesitting<> to fwd and is not able to transition or stretch out leg very far 3) Step and reach forward , cues for good knee flex, cues to move UE and LE together, cues to rotate  arms up to pronate his forearms 4) Step and reach backwards ,  cues for BUE back, getting toe up and bending back knee 5) Step and reach sideways, cues to turn  head sideways, and turn head back to neutral, and to rotate arms and pronate forearms 6) Rock and reach forward/backward , cues to reach far fwd with UE's, patient not comfortable with feet apart , not rocking very far to get a good weight shift, not able to raise  heel or raise  toes much 7) Rock and reach sideways, cues for twist and look behind , only rotates side ways, unable to twist around or turn to look behind   functional component task with supervision 5 reps and simulated activities for: 1. Sit to stand x 10   needs cues to begin with UE up for correct start position 2.balance task with 1/2 foam and tapping to step x 10 , foam step ups x 24for being able to ascend and descend a curb safely. 3. Cards shuffling x 3, dealing with wrist rotation from front of deck and back of deck, to be able to manage golf peg for golf ball 4.grooved pegboardr for coordination of fingers and hands, to improve turning pages in a book 5.Pegs in peg boardwith squeeze toolfor coordination of fingers, to improve finger coordination for using the phone Patient performed with instruction, verbal cues, tactile cues of therapist: goal: increase tissue extensibility, promote proper posture, improve mobility  Pt educated throughout session about proper posture and technique with exercises. Improved exercise technique, movement at target joints, use of target muscles after min to mod verbal, visual, tactile cues.                       PT Education - 10/28/19 1504    Education Details  LSVT BIG    Person(s) Educated  Patient    Methods  Explanation    Comprehension  Verbalized understanding;Returned demonstration;Need further instruction          PT Long Term Goals - 10/08/19 1727      PT LONG TERM GOAL #1   Title  Patient will be independent in home exercise program to improve  strength/mobility for better functional independence with ADLs.    Time  2    Period  Weeks    Status  New    Target Date  10/22/19      PT LONG TERM GOAL #2   Title  Patient will increase Berg Balance score by > 6 points to demonstrate decreased fall risk during functional activities.    Time  4    Period  Weeks    Status  New    Target Date  11/12/19      PT LONG TERM GOAL #3  Title  Patient will be independent with ascend/descend 12 steps using single UE in step over step pattern without LOB.    Time  4    Period  Weeks    Status  New    Target Date  11/12/19      PT LONG TERM GOAL #4   Title  Patient will have no gait devialtions or path deviations during horizontal or vertical  head turns or gait speed changes.    Time  8    Period  Weeks    Status  New    Target Date  11/12/19            Plan - 10/28/19 1505    Clinical Impression Statement  Patient has better stepping pattern with less stopping and better posture. Patient has difficulty with finishing BIG and needs extra cuing to perform exercises with correct amplitude and speed. Patient has difficulty with turning his head and rotating trunk with weight shifting exercises.  Patient has fatigue with standing exercises and needs constant VC to have correct posture. Patient has loss of balance and needs UE support intermittently thorough out exercise. Patient has slowness of movement during rotation and beginning movements.   Stability/Clinical Decision Making  Stable/Uncomplicated    Rehab Potential  Good    PT Frequency  4x / week    PT Duration  4 weeks    PT Treatment/Interventions  Gait training;Therapeutic exercise;Therapeutic activities;Balance training;Neuromuscular re-education;Manual techniques    PT Next Visit Plan  LSVT BIG    PT Home Exercise Plan  LSVT BIG    Consulted and Agree with Plan of Care  Patient       Patient will benefit from skilled therapeutic intervention in order to improve the  following deficits and impairments:  Abnormal gait, Decreased activity tolerance, Decreased strength, Difficulty walking, Decreased balance, Decreased coordination, Decreased mobility, Impaired tone  Visit Diagnosis: Difficulty in walking, not elsewhere classified  Other abnormalities of gait and mobility     Problem List Patient Active Problem List   Diagnosis Date Noted  . Parkinson's disease ([place]) 04/06/2017  . 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 NSTEMI (non-ST elevated myocardial infarction) ([place]) 03/26/2014  . Acute myocardial infarction, subendocardial infarction ([place]) 03/26/2014  . Atherosclerotic heart disease of native coronary artery with unstable angina pectoris ([place])   . Ischemic cardiomyopathy   . Essential hypertension   . Hyperlipidemia with target LDL less than 70   . Tobacco abuse   . History of colonic polyps 02/26/2013    Alanson Puls, [place] DPT 10/28/2019, 3:06 PM  [place] MAIN [facility] SERVICES [address] [place], [place], [address] Phone: [phone]   Fax:  [phone]  Name: EVANN AZUARA MRN: [id number] Date of Birth: 1950-08-14

## 2019-10-29 ENCOUNTER — Encounter: Payer: Self-pay | Admitting: Speech Pathology

## 2019-10-29 ENCOUNTER — Other Ambulatory Visit: Payer: Self-pay

## 2019-10-29 ENCOUNTER — Ambulatory Visit: Payer: Medicare Other | Admitting: Speech Pathology

## 2019-10-29 ENCOUNTER — Ambulatory Visit: Payer: Medicare Other | Admitting: Physical Therapy

## 2019-10-29 DIAGNOSIS — R2689 Other abnormalities of gait and mobility: Secondary | ICD-10-CM

## 2019-10-29 DIAGNOSIS — R490 Dysphonia: Secondary | ICD-10-CM

## 2019-10-29 DIAGNOSIS — R262 Difficulty in walking, not elsewhere classified: Secondary | ICD-10-CM

## 2019-10-29 NOTE — Therapy (Signed)
[place] MAIN [facility] SERVICES [address] [place], [place], [address] Phone: [phone]   Fax:  [phone]  Physical Therapy Treatment Physical Therapy Progress Note   Dates of reporting period  10/08/19  to   10/29/19 Patient Details  Name: [name] [name] MRN: [id number] Date of Birth: 04/09/1951 Referring Provider (PT): Jennings Books   Encounter Date: 10/29/2019  PT End of Session - 10/29/19 1508    Visit Number  10    Number of Visits  17    Date for PT Re-Evaluation  11/12/19    PT Start Time  0300    PT Stop Time  0355    PT Time Calculation (min)  55 min    Equipment Utilized During Treatment  Gait belt    Activity Tolerance  Patient tolerated treatment well    Behavior During Therapy  WFL for tasks assessed/performed       Past Medical History:  Diagnosis Date  . Acute non-Q wave ST elevation myocardial infarction (STEMI) 9/13-14/2015  . Atherosclerotic heart disease of native coronary artery with unstable angina pectoris ([place])    a. 03/2014 NSTEMI/Cath: LM nl, LAD 29m(FFR 0.76), D1 1053mLCX 10063mCA 114m58m 35%  . Essential hypertension   . GERD (gastroesophageal reflux disease)   . Hyperlipidemia with target LDL less than 70   . Ischemic cardiomyopathy    a. 03/2014 EF 35% by LV gram.  . Peripheral vision loss    right eye   . 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 Tobacco abuse     Past Surgical History:  Procedure Laterality Date  . ADENOIDECTOMY    . CARDIAC CATHETERIZATION     a. 03/2014 NSTEMI/Cath: LM nl, LAD 38m 45m 0.76), D1 114m, 58m114m, R90m14m, EF52m  . COLONOSCOPY WITH PROPOFOL N/A 05/30/2018   Procedure: COLONOSCOPY WITH PROPOFOL;  Surgeon: Byrnett,Robert Bellowocation: [facility];  Service: Endoscopy;  Laterality: N/A;  . CORONARY ARTERY BYPASS GRAFT N/A 03/28/2014   Procedure: CORONARY ARTERY BYPASS  GRAFTING (CABG) x  4 using left internal mammary artery and bilateral saphenous leg vein.;  Surgeon: ClarenceRexene Albertsocation: [facility] OR;  [place]: Open Heart Surgery;  Laterality: N/A;  . INTRAOPERATIVE TRANSESOPHAGEAL ECHOCARDIOGRAM N/A 03/28/2014   Procedure: INTRAOPERATIVE TRANSESOPHAGEAL ECHOCARDIOGRAM;  Surgeon: ClarenceRexene Albertsocation: [facility] OR;  [place]: Open Heart Surgery;  Laterality: N/A;    There were no vitals filed for this visit.  Subjective Assessment - 10/29/19 1507    Subjective  .He did his exercises last night.    Currently in Pain?  No/denies    Pain Score  0-No pain         [facility] PT Assessment - 10/29/19 0001      Standardized Balance Assessment   Standardized Balance Assessment  Berg Balance Test      Berg Balance Test   Sit to Stand  Able to stand without using hands and stabilize independently    Standing Unsupported  Able to stand safely 2 minutes    Sitting with Back Unsupported but Feet Supported on Floor or Stool  Able to sit safely and securely 2 minutes    Stand to Sit  Sits safely with minimal use of hands    Transfers  Able to transfer safely, minor  use of hands    Standing Unsupported with Eyes Closed  Able to stand 10 seconds safely    Standing Unsupported with Feet Together  Able to place feet together independently and stand 1 minute safely    From Standing, Reach Forward with Outstretched Arm  Can reach confidently >25 cm (10")    From Standing Position, Pick up Object from Floor  Able to pick up shoe safely and easily    From Standing Position, Turn to Look Behind Over each Shoulder  Looks behind from both sides and weight shifts well    Turn 360 Degrees  Able to turn 360 degrees safely in 4 seconds or less    Standing Unsupported, Alternately Place Feet on Step/Stool  Able to stand independently and safely and complete 8 steps in 20 seconds    Standing Unsupported, One Foot in Front  Able to place foot tandem independently and hold 30  seconds    Standing on One Leg  Able to lift leg independently and hold > 10 seconds    Total Score  56         Treatment: Goals reviewed with berg balance test performed    Patient seen for LSVT Daily Session Maximal Daily Exercises for facilitation/coordination of movement Sustained movements are designed to rescale the amplitude of movement output for generalization to daily functional activities .Performed as follows for 1 set of 10 repetitions each multidirectional sustained movements  1) Floor to ceiling , cues to hold for 10 seconds,needs modeling for correct positions , VC to reach out further and to reach up to the ceiling 2) Side to side multidirectional Repetitive movements performed in sitting and are designed to provide retraining effort needed for sustained muscle activation in tasks , cues to lean fwd and hold position x 10 counts: Patient does not move fwd<>sidesitting<> to fwd and is not able to transition or stretch out leg very far 3) Step and reach forward , cues for good knee flex, cues to move UE and LE together, cues to rotate  arms up to pronate his forearms 4) Step and reach backwards , cues for BUE back, getting toe up and bending back knee 5) Step and reach sideways, cues to turn  head sideways, and turn head back to neutral, and to rotate arms and pronate forearms 6) Rock and reach forward/backward , cues to reach far fwd with UE's, patient not comfortable with feet apart , not rocking very far to get a good weight shift, not able to raise  heel or raise  toes much 7) Rock and reach sideways, cues for twist and look behind , only rotates side ways, unable to twist around or turn to look behind   functional component task with supervision 5 reps and simulated activities for: 1. Sit to stand x 10   needs cues to begin with UE up for correct start position 2.balance task with 1/2 foam and tapping to step x 10 , foam step ups x 35fr being able to ascend and descend a  curb safely. 3. Cards shuffling x 3, dealing with wrist rotation from front of deck and back of deck, to be able to manage golf peg for golf ball 4.grooved pegboardr for coordination of fingers and hands, to improve turning pages in a book 5.Pegs in peg boardwith squeeze toolfor coordination of fingers, to improve finger coordination for using the phone Patient performed with instruction, verbal cues, tactile cues of therapist: goal: increase tissue extensibility, promote proper  posture, improve mobility  Pt educated throughout session about proper posture and technique with exercises. Improved exercise technique, movement at target joints, use of target muscles after min to mod verbal, visual, tactile cues.                    PT Education - 10/29/19 1507    Education Details  LSVT BIG    Person(s) Educated  Patient    Methods  Explanation    Comprehension  Verbalized understanding          PT Long Term Goals - 10/29/19 1455      PT LONG TERM GOAL #1   Title  Patient will be independent in home exercise program to improve strength/mobility for better functional independence with ADLs.    Time  2    Period  Weeks    Status  Achieved      PT LONG TERM GOAL #2   Title  Patient will increase Berg Balance score by > 6 points to demonstrate decreased fall risk during functional activities.    Baseline  10/29/19=56/56    Time  4    Period  Weeks    Status  Achieved    Target Date  11/12/19      PT LONG TERM GOAL #3   Title  Patient will be independent with ascend/descend 12 steps using single UE in step over step pattern without LOB.    Time  4    Period  Weeks    Status  Partially Met    Target Date  11/12/19      PT LONG TERM GOAL #4   Title  Patient will have no gait devialtions or path deviations during horizontal or vertical  head turns or gait speed changes.    Time  8    Period  Weeks    Status  Partially Met    Target Date  11/12/19             Plan - 10/29/19 1508    Clinical Impression Statement  Patient's condition has the potential to improve in response to therapy. Maximum improvement is yet to be obtained. The anticipated improvement is attainable and reasonable in a generally predictable time.  Patient reports that he is having less sway with walking. Patient has better stepping pattern with less stopping and better posture. Patient has difficulty with finishing BIG and needs extra cuing to perform exercises with correct amplitude and speed. Patient has difficulty with turning his head and rotating trunk with weight shifting exercises.  Patient has fatigue with standing exercises and needs constant VC to have correct posture. Patient has loss of balance and needs UE support intermittently thorough out exercise. Patient has slowness of movement during rotation and beginning movements.   Stability/Clinical Decision Making  Stable/Uncomplicated    Rehab Potential  Good    PT Frequency  4x / week    PT Duration  4 weeks    PT Treatment/Interventions  Gait training;Therapeutic exercise;Therapeutic activities;Balance training;Neuromuscular re-education;Manual techniques    PT Next Visit Plan  LSVT BIG    PT Home Exercise Plan  LSVT BIG    Consulted and Agree with Plan of Care  Patient       Patient will benefit from skilled therapeutic intervention in order to improve the following deficits and impairments:  Abnormal gait, Decreased activity tolerance, Decreased strength, Difficulty walking, Decreased balance, Decreased coordination, Decreased mobility, Impaired tone  Visit Diagnosis: Difficulty in walking, not  elsewhere classified  Other abnormalities of gait and mobility  Dysphonia     Problem List Patient Active Problem List   Diagnosis Date Noted  . Parkinson's disease ([place]) 04/06/2017  . 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 NSTEMI (non-ST elevated myocardial infarction) ([place]) 03/26/2014  . Acute myocardial  infarction, subendocardial infarction ([place]) 03/26/2014  . Atherosclerotic heart disease of native coronary artery with unstable angina pectoris ([place])   . Ischemic cardiomyopathy   . Essential hypertension   . Hyperlipidemia with target LDL less than 70   . Tobacco abuse   . History of colonic polyps 02/26/2013    Alanson Puls, PT DPT 10/29/2019, 3:09 PM  [place] MAIN [facility] SERVICES [address] [place], [place], [address] Phone: [phone]   Fax:  [phone]  Name: [name] [name] MRN: [id number] Date of Birth: 06-14-1951

## 2019-10-29 NOTE — Therapy (Signed)
[place] MAIN [facility] SERVICES [address] [place], [place], [address] Phone: [phone]   Fax:  [phone]  Speech Language Pathology Treatment/Progress Note  Speech Therapy Progress Note   Dates of reporting period  10/08/2019   to   10/28/2019   Patient Details  Name: [name] [name] MRN: [id number] Date of Birth: 09/05/1950 Referring Provider (SLP): Dr. Manuella Ghazi   Encounter Date: 10/28/2019  End of Session - 10/29/19 1111    Visit Number  10    Number of Visits  17    Date for SLP Re-Evaluation  11/08/19    Authorization Type  Medicare    Authorization Time Period  Start 10/08/2019    Authorization - Visit Number  10    Authorization - Number of Visits  10    SLP Start Time  1600    SLP Stop Time   1700    SLP Time Calculation (min)  60 min    Activity Tolerance  Patient tolerated treatment well       Past Medical History:  Diagnosis Date  . Acute non-Q wave ST elevation myocardial infarction (STEMI) 9/13-14/2015  . Atherosclerotic heart disease of native coronary artery with unstable angina pectoris ([place])    a. 03/2014 NSTEMI/Cath: LM nl, LAD 37m(FFR 0.76), D1 1058mLCX 10019mCA 144m96m 35%  . Essential hypertension   . GERD (gastroesophageal reflux disease)   . Hyperlipidemia with target LDL less than 70   . Ischemic cardiomyopathy    a. 03/2014 EF 35% by LV gram.  . Peripheral vision loss    right eye   . 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 Tobacco abuse     Past Surgical History:  Procedure Laterality Date  . ADENOIDECTOMY    . CARDIAC CATHETERIZATION     a. 03/2014 NSTEMI/Cath: LM nl, LAD 71m 52m 0.76), D1 144m, 72m144m, R57m44m, EF59m  . COLONOSCOPY WITH PROPOFOL N/A 05/30/2018   Procedure: COLONOSCOPY WITH PROPOFOL;  Surgeon: Byrnett,Robert Bellowocation: [facility] ENDOSCOPY;  Service: Endoscopy;  Laterality: N/A;  . CORONARY  ARTERY BYPASS GRAFT N/A 03/28/2014   Procedure: CORONARY ARTERY BYPASS GRAFTING (CABG) x  4 using left internal mammary artery and bilateral saphenous leg vein.;  Surgeon: ClarenceRexene Albertsocation: [facility] OR;  [place]: Open Heart Surgery;  Laterality: N/A;  . INTRAOPERATIVE TRANSESOPHAGEAL ECHOCARDIOGRAM N/A 03/28/2014   Procedure: INTRAOPERATIVE TRANSESOPHAGEAL ECHOCARDIOGRAM;  Surgeon: ClarenceRexene Albertsocation: [facility];  [place]: Open Heart Surgery;  Laterality: N/A;    There were no vitals filed for this visit.  Subjective Assessment - 10/29/19 1110    Subjective  Pt engaged and working hard, reports that people are not asking his "What?" as often            ADULT SLP TREATMENT - 10/29/19 0001      General Information   Behavior/Cognition  Alert;Cooperative;Pleasant mood    HPI  Parkinson's diease      Treatment Provided   Treatment provided  Cognitive-Linquistic      Pain Assessment   Pain Assessment  No/denies pain      Cognitive-Linquistic Treatment   Treatment focused on  Voice    Skilled Treatment  Daily Task #1 (Maximum sustained "ah"): Average 9 seconds, 85 dB. Daily Task 2 (Maximum fundamental  frequency range): Highs: 15 high pitched "ah" (275 Hz). Lows: 15 low pitched "ah" (160 Hz). Daily task #3 (Maximum speech loudness drill of functional phrases): Average 75 dB.  Hierarchal speech loudness drill: Read paragraphas, 75 dB.  Homework: completed.  Off the cuff remarks: average 72 dB.        Assessment / Recommendations / Plan   Plan  Continue with current plan of care      Progression Toward Goals   Progression toward goals  Progressing toward goals       SLP Education - 10/29/19 1111    Education Details  LSVT-LOUD    Person(s) Educated  Patient    Methods  Explanation    Comprehension  Verbalized understanding         SLP Long Term Goals - 10/29/19 1112      SLP LONG TERM GOAL #1   Title  The patient will complete Daily Tasks (Maximum duration  "ah", High/Lows, and Functional Phrases) at average loudness of 80 dB and with loud, good quality voice.    Status  Partially Met    Target Date  11/08/19      SLP LONG TERM GOAL #2   Title  The patient will complete Hierarchal Speech Loudness reading drills (words/phrases, sentences, and paragraph) at average 75 dB and with loud, good quality voice.    Status  Partially Met    Target Date  11/08/19      SLP LONG TERM GOAL #3   Title  The patient will participate in conversation, maintaining average loudness of 75 dB and loud, good quality voice.    Status  Partially Met    Target Date  11/08/19      SLP LONG TERM GOAL #4   Title  The patient will complete homework daily.    Status  On-going    Target Date  11/08/19       Plan - 10/29/19 1111    Clinical Impression Statement  The patient is completing daily tasks and hierarchal speech drill tasks with loud, good quality voice with minimal shaping required for loudness, quality, and pitch range. He requires significant cues/shaping for duration, however he increased his time by 40% today. His off the cuff remarks remain significantly louder.       Patient will benefit from skilled therapeutic intervention in order to improve the following deficits and impairments:   Dysphonia    Problem List Patient Active Problem List   Diagnosis Date Noted  . Parkinson's disease ([place]) 04/06/2017  . 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 NSTEMI (non-ST elevated myocardial infarction) ([place]) 03/26/2014  . Acute myocardial infarction, subendocardial infarction ([place]) 03/26/2014  . Atherosclerotic heart disease of native coronary artery with unstable angina pectoris ([place])   . Ischemic cardiomyopathy   . Essential hypertension   . Hyperlipidemia with target LDL less than 70   . Tobacco abuse   . History of colonic polyps 02/26/2013   [name] Sea, MS/CCC- SLP  [name] [name] 10/29/2019, 11:13 AM  [place] [address] [place], [place], [address] Phone: [phone]   Fax:  [phone]   Name: [name] [name] MRN: [id number] Date of Birth: 08/13/1950

## 2019-10-30 ENCOUNTER — Ambulatory Visit: Payer: Medicare Other | Admitting: Physical Therapy

## 2019-10-30 ENCOUNTER — Encounter: Payer: Self-pay | Admitting: Physical Therapy

## 2019-10-30 ENCOUNTER — Encounter: Payer: Self-pay | Admitting: Speech Pathology

## 2019-10-30 ENCOUNTER — Other Ambulatory Visit: Payer: Self-pay

## 2019-10-30 ENCOUNTER — Ambulatory Visit: Payer: Medicare Other | Admitting: Speech Pathology

## 2019-10-30 DIAGNOSIS — R2689 Other abnormalities of gait and mobility: Secondary | ICD-10-CM

## 2019-10-30 DIAGNOSIS — R490 Dysphonia: Secondary | ICD-10-CM

## 2019-10-30 DIAGNOSIS — R262 Difficulty in walking, not elsewhere classified: Secondary | ICD-10-CM

## 2019-10-30 NOTE — Therapy (Signed)
[place] MAIN [facility] SERVICES [address] [place], [place], [address] Phone: [phone]   Fax:  [phone]  Speech Language Pathology Treatment  Patient Details  Name: [name] [name] MRN: [id number] Date of Birth: 1950/09/21 Referring Provider (SLP): Dr. Manuella Ghazi   Encounter Date: 10/29/2019  End of Session - 10/30/19 0834    Visit Number  11    Number of Visits  17    Date for SLP Re-Evaluation  11/08/19    Authorization Time Period  Start 10/29/2019    Authorization - Visit Number  1    Authorization - Number of Visits  10    SLP Start Time  1600    SLP Stop Time   1700    SLP Time Calculation (min)  60 min    Activity Tolerance  Patient tolerated treatment well       Past Medical History:  Diagnosis Date  . Acute non-Q wave ST elevation myocardial infarction (STEMI) 9/13-14/2015  . Atherosclerotic heart disease of native coronary artery with unstable angina pectoris ([place])    a. 03/2014 NSTEMI/Cath: LM nl, LAD 41m(FFR 0.76), D1 1030mLCX 10036mCA 15m66m 35%  . Essential hypertension   . GERD (gastroesophageal reflux disease)   . Hyperlipidemia with target LDL less than 70   . Ischemic cardiomyopathy    a. 03/2014 EF 35% by LV gram.  . Peripheral vision loss    right eye   . 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 Tobacco abuse     Past Surgical History:  Procedure Laterality Date  . ADENOIDECTOMY    . CARDIAC CATHETERIZATION     a. 03/2014 NSTEMI/Cath: LM nl, LAD 51m 67m 0.76), D1 15m, 93m15m, R25m5m, EF17m  . COLONOSCOPY WITH PROPOFOL N/A 05/30/2018   Procedure: COLONOSCOPY WITH PROPOFOL;  Surgeon: Byrnett,Robert Bellowocation: [facility] ENDOSCOPY;  Service: Endoscopy;  Laterality: N/A;  . CORONARY ARTERY BYPASS GRAFT N/A 03/28/2014   Procedure: CORONARY ARTERY BYPASS GRAFTING (CABG) x  4 using left internal mammary artery and bilateral  saphenous leg vein.;  Surgeon: ClarenceRexene Albertsocation: [facility];  [place]: Open Heart Surgery;  Laterality: N/A;  . INTRAOPERATIVE TRANSESOPHAGEAL ECHOCARDIOGRAM N/A 03/28/2014   Procedure: INTRAOPERATIVE TRANSESOPHAGEAL ECHOCARDIOGRAM;  Surgeon: ClarenceRexene Albertsocation: [facility];  [place]: Open Heart Surgery;  Laterality: N/A;    There were no vitals filed for this visit.  Subjective Assessment - 10/30/19 0834    Subjective  Pt engaged and working hard, reports that people are not asking his "What?" as often            ADULT SLP TREATMENT - 10/30/19 0001      General Information   Behavior/Cognition  Alert;Cooperative;Pleasant mood    HPI  Parkinson's diease      Treatment Provided   Treatment provided  Cognitive-Linquistic      Pain Assessment   Pain Assessment  No/denies pain      Cognitive-Linquistic Treatment   Treatment focused on  Voice    Skilled Treatment  Daily Task #1 (Maximum sustained "ah"): Average 8 seconds, 85 dB. Daily Task 2 (Maximum fundamental frequency range): Highs: 15 high pitched "ah" (270 Hz). Lows: 15 low pitched "ah" (155 Hz). Daily task #3 (Maximum speech loudness drill of functional phrases): Average 75  dB.  Hierarchal speech loudness drill: Read paragraphs, 75 dB.  Generate phrases to answer miscellaneous questions, 75 dB.  Homework: completed.  Off the cuff remarks: average 72 dB.        Assessment / Recommendations / Plan   Plan  Continue with current plan of care      Progression Toward Goals   Progression toward goals  Progressing toward goals       SLP Education - 10/30/19 0834    Education Details  LSVT-LOUD, callibration    Person(s) Educated  Patient    Methods  Explanation    Comprehension  Verbalized understanding         SLP Long Term Goals - 10/29/19 1112      SLP LONG TERM GOAL #1   Title  The patient will complete Daily Tasks (Maximum duration "ah", High/Lows, and Functional Phrases) at average loudness of 80 dB  and with loud, good quality voice.    Status  Partially Met    Target Date  11/08/19      SLP LONG TERM GOAL #2   Title  The patient will complete Hierarchal Speech Loudness reading drills (words/phrases, sentences, and paragraph) at average 75 dB and with loud, good quality voice.    Status  Partially Met    Target Date  11/08/19      SLP LONG TERM GOAL #3   Title  The patient will participate in conversation, maintaining average loudness of 75 dB and loud, good quality voice.    Status  Partially Met    Target Date  11/08/19      SLP LONG TERM GOAL #4   Title  The patient will complete homework daily.    Status  On-going    Target Date  11/08/19       Plan - 10/30/19 0835    Clinical Impression Statement  The patient is completing daily tasks and hierarchal speech drill tasks with loud, good quality voice with minimal shaping required for loudness, quality, and pitch range. He requires significant cues/shaping for duration, however he increased his time by 40% today. His off the cuff remarks remain significantly louder.    Speech Therapy Frequency  4x / week    Duration  4 weeks    Treatment/Interventions  SLP instruction and feedback;Patient/family education    Potential to Achieve Goals  Good    Potential Considerations  Ability to learn/carryover information;Previous level of function;Co-morbidities;Severity of impairments;Cooperation/participation level;Medical prognosis;Family/community support    SLP Home Exercise Plan  LSVT-LOUD Daily Homework    Consulted and Agree with Plan of Care  Patient       Patient will benefit from skilled therapeutic intervention in order to improve the following deficits and impairments:   Dysphonia    Problem List Patient Active Problem List   Diagnosis Date Noted  . Parkinson's disease ([place]) 04/06/2017  . 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 NSTEMI (non-ST elevated myocardial infarction) ([place]) 03/26/2014  . Acute myocardial infarction,  subendocardial infarction ([place]) 03/26/2014  . Atherosclerotic heart disease of native coronary artery with unstable angina pectoris ([place])   . Ischemic cardiomyopathy   . Essential hypertension   . Hyperlipidemia with target LDL less than 70   . Tobacco abuse   . History of colonic polyps 02/26/2013   [place] Sea, MS/CCC- SLP  Lou Miner 10/30/2019, 8:36 AM  [place] MAIN [facility] SERVICES [address] [place], [place], [address] Phone: [phone]  Fax:  [phone]   Name: BRAIDON CHERMAK MRN: [id number] Date of Birth: 07/17/1950

## 2019-10-30 NOTE — Therapy (Signed)
[place] MAIN [facility] SERVICES [address] [place], [place], [address] Phone: [phone]   Fax:  [phone]  Physical Therapy Treatment  Patient Details  Name: [name] [name] MRN: [id number] Date of Birth: 09/30/1950 Referring Provider (PT): Jennings Books   Encounter Date: 10/30/2019  PT End of Session - 10/30/19 1502    Visit Number  11    Number of Visits  17    Date for PT Re-Evaluation  11/12/19    PT Start Time  1500    PT Stop Time  1555    PT Time Calculation (min)  55 min    Equipment Utilized During Treatment  Gait belt    Activity Tolerance  Patient tolerated treatment well    Behavior During Therapy  WFL for tasks assessed/performed       Past Medical History:  Diagnosis Date  . Acute non-Q wave ST elevation myocardial infarction (STEMI) 9/13-14/2015  . Atherosclerotic heart disease of native coronary artery with unstable angina pectoris ([place])    a. 03/2014 NSTEMI/Cath: LM nl, LAD 76m(FFR 0.76), D1 1055mLCX 10042mCA 117m30m 35%  . Essential hypertension   . GERD (gastroesophageal reflux disease)   . Hyperlipidemia with target LDL less than 70   . Ischemic cardiomyopathy    a. 03/2014 EF 35% by LV gram.  . Peripheral vision loss    right eye   . 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 Tobacco abuse     Past Surgical History:  Procedure Laterality Date  . ADENOIDECTOMY    . CARDIAC CATHETERIZATION     a. 03/2014 NSTEMI/Cath: LM nl, LAD 51m 30m 0.76), D1 117m, 55m117m, R108m17m, EF48m  . COLONOSCOPY WITH PROPOFOL N/A 05/30/2018   Procedure: COLONOSCOPY WITH PROPOFOL;  Surgeon: Byrnett,Robert Bellowocation: [facility] ENDOSCOPY;  Service: Endoscopy;  Laterality: N/A;  . CORONARY ARTERY BYPASS GRAFT N/A 03/28/2014   Procedure: CORONARY ARTERY BYPASS GRAFTING (CABG) x  4 using left internal mammary artery and bilateral saphenous leg vein.;   Surgeon: ClarenceRexene Albertsocation: [facility];  [place]: Open Heart Surgery;  Laterality: N/A;  . INTRAOPERATIVE TRANSESOPHAGEAL ECHOCARDIOGRAM N/A 03/28/2014   Procedure: INTRAOPERATIVE TRANSESOPHAGEAL ECHOCARDIOGRAM;  Surgeon: ClarenceRexene Albertsocation: [facility];  [place]: Open Heart Surgery;  Laterality: N/A;    There were no vitals filed for this visit.  Subjective Assessment - 10/30/19 1500    Subjective  .He did his exercises last night.    Pertinent History  Patient had bipass surgery and then the parkinsons began 2017. He has a resting tremmor. He does not use a walking device.He plays golf 2-3 times / week. He sometimes has difficulty with yard work, he has back pain that is intermittent.          Treatment:   Patient seen for LSVT Daily Session Maximal Daily Exercises for facilitation/coordination of movement Sustained movements are designed to rescale the amplitude of movement output for generalization to daily functional activities .Performed as follows for 1 set of 10 repetitions each multidirectional sustained movements  1) Floor to ceiling , cues to hold for 10 seconds,needs modeling for correct positions , VC to reach out further and to reach up to the ceiling 2) Side to side multidirectional Repetitive movements performed in sitting and are designed  to provide retraining effort needed for sustained muscle activation in tasks , cues to lean fwd and hold position x 10 counts: Patient does not move fwd<>sidesitting<> to fwd and is not able to transition or stretch out leg very far 3) Step and reach forward , cues for good knee flex, cues to move UE and LE together, cues to rotate  arms up to pronate his forearms 4) Step and reach backwards , cues for BUE back, getting toe up and bending back knee 5) Step and reach sideways, cues to turn  head sideways, and turn head back to neutral, and to rotate arms and pronate forearms 6) Rock and reach forward/backward , cues to reach far  fwd with UE's, patient not comfortable with feet apart , not rocking very far to get a good weight shift, not able to raise  heel or raise  toes much 7) Rock and reach sideways, cues for twist and look behind , only rotates side ways, unable to twist around or turn to look behind   functional component task with supervision 5 reps and simulated activities for: 1. Sit to stand x 10   needs cues to begin with UE up for correct start position 2.balance task with 1/2 foam and tapping to step x 10 , foam step ups x 60fr being able to ascend and descend a curb safely. 3. Cards shuffling x 3, dealing with wrist rotation from front of deck and back of deck, to be able to manage golf peg for golf ball 4.clothes pins with resistance for coordination of fingers and hands, to improve turning pages in a book 5.Purdue peg boardfor coordination of fingers, to improve finger coordination for using the phone   Walking with big arm swing and cues for BIG SWING x 1000 x 3   Pt educated throughout session about proper posture and technique with exercises. Improved exercise technique, movement at target joints, use of target muscles after min to mod verbal, visual, tactile cues.                       PT Education - 10/30/19 1501    Education Details  LSVT BIG    Person(s) Educated  Patient    Methods  Explanation    Comprehension  Verbalized understanding          PT Long Term Goals - 10/29/19 1455      PT LONG TERM GOAL #1   Title  Patient will be independent in home exercise program to improve strength/mobility for better functional independence with ADLs.    Time  2    Period  Weeks    Status  Achieved      PT LONG TERM GOAL #2   Title  Patient will increase Berg Balance score by > 6 points to demonstrate decreased fall risk during functional activities.    Baseline  10/29/19=56/56    Time  4    Period  Weeks    Status  Achieved    Target Date  11/12/19      PT LONG  TERM GOAL #3   Title  Patient will be independent with ascend/descend 12 steps using single UE in step over step pattern without LOB.    Time  4    Period  Weeks    Status  Partially Met    Target Date  11/12/19      PT LONG TERM GOAL #4   Title  Patient will have  no gait devialtions or path deviations during horizontal or vertical  head turns or gait speed changes.    Time  8    Period  Weeks    Status  Partially Met    Target Date  11/12/19            Plan - 10/30/19 1503    Clinical Impression Statement  Min cueing needed to appropriately perform LSVT tasks with leg, hand, and head position. Decreased coordination demonstrated requiring consistent verbal cueing to correct form. Cognitive understanding of task was delayed. Patient continues to demonstrate some in coordination of movement with select exercises such as rock and reach and stepping backwards. Patient responds well to verbal and tactile cues to correct form and technique.  CGA to SBA for safety with activities.  Uses to increase intensity and amplitude of movements throughout session   Stability/Clinical Decision Making  Stable/Uncomplicated    Rehab Potential  Good    PT Frequency  4x / week    PT Duration  4 weeks    PT Treatment/Interventions  Gait training;Therapeutic exercise;Therapeutic activities;Balance training;Neuromuscular re-education;Manual techniques    PT Next Visit Plan  LSVT BIG    PT Home Exercise Plan  LSVT BIG    Consulted and Agree with Plan of Care  Patient       Patient will benefit from skilled therapeutic intervention in order to improve the following deficits and impairments:  Abnormal gait, Decreased activity tolerance, Decreased strength, Difficulty walking, Decreased balance, Decreased coordination, Decreased mobility, Impaired tone  Visit Diagnosis: Dysphonia  Difficulty in walking, not elsewhere classified  Other abnormalities of gait and mobility     Problem List Patient  Active Problem List   Diagnosis Date Noted  . Parkinson's disease ([place]) 04/06/2017  . 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 NSTEMI (non-ST elevated myocardial infarction) ([place]) 03/26/2014  . Acute myocardial infarction, subendocardial infarction ([place]) 03/26/2014  . Atherosclerotic heart disease of native coronary artery with unstable angina pectoris ([place])   . Ischemic cardiomyopathy   . Essential hypertension   . Hyperlipidemia with target LDL less than 70   . Tobacco abuse   . History of colonic polyps 02/26/2013    Alanson Puls, [place] DPT 10/30/2019, 3:04 PM  [place] MAIN [facility] SERVICES [address] [place], [place], [address] Phone: [phone]   Fax:  [phone]  Name: [name] [name] MRN: [id number] Date of Birth: Jan 19, 1951

## 2019-10-31 ENCOUNTER — Other Ambulatory Visit: Payer: Self-pay

## 2019-10-31 ENCOUNTER — Encounter: Payer: Self-pay | Admitting: Speech Pathology

## 2019-10-31 ENCOUNTER — Ambulatory Visit: Payer: Medicare Other | Admitting: Speech Pathology

## 2019-10-31 ENCOUNTER — Ambulatory Visit: Payer: Medicare Other | Admitting: Physical Therapy

## 2019-10-31 ENCOUNTER — Encounter: Payer: Self-pay | Admitting: Physical Therapy

## 2019-10-31 DIAGNOSIS — R490 Dysphonia: Secondary | ICD-10-CM

## 2019-10-31 DIAGNOSIS — R262 Difficulty in walking, not elsewhere classified: Secondary | ICD-10-CM

## 2019-10-31 DIAGNOSIS — R2689 Other abnormalities of gait and mobility: Secondary | ICD-10-CM

## 2019-10-31 NOTE — Therapy (Signed)
[place] MAIN [facility] SERVICES [address] [place], [place], [address] Phone: [phone]   Fax:  [phone]  Speech Language Pathology Treatment  Patient Details  Name: [name] [name] MRN: [id number] Date of Birth: 06-12-1951 Referring Provider (SLP): Dr. Manuella Ghazi   Encounter Date: 10/31/2019  End of Session - 10/31/19 1702    Visit Number  13    Number of Visits  17    Date for SLP Re-Evaluation  11/08/19    Authorization Type  Medicare    Authorization Time Period  Start 10/29/2019    Authorization - Visit Number  3    Authorization - Number of Visits  10    SLP Start Time  1600    SLP Stop Time   1700    SLP Time Calculation (min)  60 min    Activity Tolerance  Patient tolerated treatment well       Past Medical History:  Diagnosis Date  . Acute non-Q wave ST elevation myocardial infarction (STEMI) 9/13-14/2015  . Atherosclerotic heart disease of native coronary artery with unstable angina pectoris ([place])    a. 03/2014 NSTEMI/Cath: LM nl, LAD 41m(FFR 0.76), D1 1026mLCX 10021mCA 165m34m 35%  . Essential hypertension   . GERD (gastroesophageal reflux disease)   . Hyperlipidemia with target LDL less than 70   . Ischemic cardiomyopathy    a. 03/2014 EF 35% by LV gram.  . Peripheral vision loss    right eye   . 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 Tobacco abuse     Past Surgical History:  Procedure Laterality Date  . ADENOIDECTOMY    . CARDIAC CATHETERIZATION     a. 03/2014 NSTEMI/Cath: LM nl, LAD 78m 60m 0.76), D1 165m, 66m165m, R38m65m, EF57m  . COLONOSCOPY WITH PROPOFOL N/A 05/30/2018   Procedure: COLONOSCOPY WITH PROPOFOL;  Surgeon: Byrnett,Robert Bellowocation: [facility] ENDOSCOPY;  Service: Endoscopy;  Laterality: N/A;  . CORONARY ARTERY BYPASS GRAFT N/A 03/28/2014   Procedure: CORONARY ARTERY BYPASS GRAFTING (CABG) x  4 using left  internal mammary artery and bilateral saphenous leg vein.;  Surgeon: ClarenceRexene Albertsocation: [facility];  [place]: Open Heart Surgery;  Laterality: N/A;  . INTRAOPERATIVE TRANSESOPHAGEAL ECHOCARDIOGRAM N/A 03/28/2014   Procedure: INTRAOPERATIVE TRANSESOPHAGEAL ECHOCARDIOGRAM;  Surgeon: ClarenceRexene Albertsocation: [facility];  [place]: Open Heart Surgery;  Laterality: N/A;    There were no vitals filed for this visit.  Subjective Assessment - 10/31/19 1702    Subjective  Pt engaged and working hard, reports that people are not asking his "What?" as often            ADULT SLP TREATMENT - 10/31/19 1701      General Information   Behavior/Cognition  Alert;Cooperative;Pleasant mood    HPI  Parkinson's diease      Treatment Provided   Treatment provided  Cognitive-Linquistic      Pain Assessment   Pain Assessment  No/denies pain      Cognitive-Linquistic Treatment   Treatment focused on  Voice    Skilled Treatment  Daily Task #1 (Maximum sustained "ah"): Average 8 seconds, 85 dB. Daily Task 2 (Maximum fundamental frequency range): Highs: 15 high pitched "ah" (270 Hz). Lows: 15 low pitched "ah" (155 Hz). Daily task #3 (Maximum speech  loudness drill of functional phrases): Average 75 dB.  Hierarchal speech loudness drill: Read paragraphs, 75 dB.  Generate phrases to answer miscellaneous questions, 75 dB.  Homework: completed.  Off the cuff remarks: average 72 dB.        Assessment / Recommendations / Plan   Plan  Continue with current plan of care      Progression Toward Goals   Progression toward goals  Progressing toward goals       SLP Education - 10/31/19 1702    Education Details  LSVT-LOUD    Person(s) Educated  Patient    Methods  Explanation    Comprehension  Verbalized understanding         SLP Long Term Goals - 10/29/19 1112      SLP LONG TERM GOAL #1   Title  The patient will complete Daily Tasks (Maximum duration "ah", High/Lows, and Functional Phrases) at  average loudness of 80 dB and with loud, good quality voice.    Status  Partially Met    Target Date  11/08/19      SLP LONG TERM GOAL #2   Title  The patient will complete Hierarchal Speech Loudness reading drills (words/phrases, sentences, and paragraph) at average 75 dB and with loud, good quality voice.    Status  Partially Met    Target Date  11/08/19      SLP LONG TERM GOAL #3   Title  The patient will participate in conversation, maintaining average loudness of 75 dB and loud, good quality voice.    Status  Partially Met    Target Date  11/08/19      SLP LONG TERM GOAL #4   Title  The patient will complete homework daily.    Status  On-going    Target Date  11/08/19       Plan - 10/31/19 1703    Clinical Impression Statement  The patient is completing daily tasks and hierarchal speech drill tasks with loud, good quality voice independently. He demonstrates increasing generalization into conversation.    Speech Therapy Frequency  4x / week    Duration  4 weeks    Treatment/Interventions  SLP instruction and feedback;Patient/family education    Potential to Achieve Goals  Good    Potential Considerations  Ability to learn/carryover information;Previous level of function;Co-morbidities;Severity of impairments;Cooperation/participation level;Medical prognosis;Family/community support    SLP Home Exercise Plan  LSVT-LOUD Daily Homework    Consulted and Agree with Plan of Care  Patient       Patient will benefit from skilled therapeutic intervention in order to improve the following deficits and impairments:   Dysphonia    Problem List Patient Active Problem List   Diagnosis Date Noted  . Parkinson's disease ([place]) 04/06/2017  . 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 NSTEMI (non-ST elevated myocardial infarction) ([place]) 03/26/2014  . Acute myocardial infarction, subendocardial infarction ([place]) 03/26/2014  . Atherosclerotic heart disease of native coronary artery with unstable angina  pectoris ([place])   . Ischemic cardiomyopathy   . Essential hypertension   . Hyperlipidemia with target LDL less than 70   . Tobacco abuse   . History of colonic polyps 02/26/2013   Leroy Sea, MS/CCC- SLP  [name] [name] 10/31/2019, 5:04 PM  [place] MAIN [facility] SERVICES [address] [place], [place], [address] Phone: [phone]   Fax:  [phone]   Name: [name] [name] MRN: [id number] Date of Birth: [birth year]

## 2019-10-31 NOTE — Therapy (Signed)
[place] MAIN [facility] SERVICES [address] [place], [place], [address] Phone: [phone]   Fax:  [phone]  Speech Language Pathology Treatment  Patient Details  Name: [name] [name] MRN: [id number] Date of Birth: May 23, 1951 Referring Provider (SLP): Dr. Manuella Ghazi   Encounter Date: 10/30/2019  End of Session - 10/31/19 0904    Visit Number  12    Number of Visits  17    Date for SLP Re-Evaluation  11/08/19    Authorization Type  Medicare    Authorization Time Period  Start 10/29/2019    Authorization - Visit Number  2    Authorization - Number of Visits  10    SLP Start Time  1600    SLP Stop Time   1700    SLP Time Calculation (min)  60 min    Activity Tolerance  Patient tolerated treatment well       Past Medical History:  Diagnosis Date  . Acute non-Q wave ST elevation myocardial infarction (STEMI) 9/13-14/2015  . Atherosclerotic heart disease of native coronary artery with unstable angina pectoris ([place])    a. 03/2014 NSTEMI/Cath: LM nl, LAD 4m(FFR 0.76), D1 1037mLCX 10030mCA 141m49m 35%  . Essential hypertension   . GERD (gastroesophageal reflux disease)   . Hyperlipidemia with target LDL less than 70   . Ischemic cardiomyopathy    a. 03/2014 EF 35% by LV gram.  . Peripheral vision loss    right eye   . 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 Tobacco abuse     Past Surgical History:  Procedure Laterality Date  . ADENOIDECTOMY    . CARDIAC CATHETERIZATION     a. 03/2014 NSTEMI/Cath: LM nl, LAD 64m 59m 0.76), D1 141m, 35m141m, R59m41m, EF78m  . COLONOSCOPY WITH PROPOFOL N/A 05/30/2018   Procedure: COLONOSCOPY WITH PROPOFOL;  Surgeon: Byrnett,Robert Bellowocation: [facility] ENDOSCOPY;  Service: Endoscopy;  Laterality: N/A;  . CORONARY ARTERY BYPASS GRAFT N/A 03/28/2014   Procedure: CORONARY ARTERY BYPASS GRAFTING (CABG) x  4 using left  internal mammary artery and bilateral saphenous leg vein.;  Surgeon: ClarenceRexene Albertsocation: [facility];  [place]: Open Heart Surgery;  Laterality: N/A;  . INTRAOPERATIVE TRANSESOPHAGEAL ECHOCARDIOGRAM N/A 03/28/2014   Procedure: INTRAOPERATIVE TRANSESOPHAGEAL ECHOCARDIOGRAM;  Surgeon: ClarenceRexene Albertsocation: [facility];  [place]: Open Heart Surgery;  Laterality: N/A;    There were no vitals filed for this visit.  Subjective Assessment - 10/31/19 0903    Subjective  Pt engaged and working hard, reports that people are not asking his "What?" as often            ADULT SLP TREATMENT - 10/31/19 0001      General Information   Behavior/Cognition  Alert;Cooperative;Pleasant mood    HPI  Parkinson's diease      Treatment Provided   Treatment provided  Cognitive-Linquistic      Pain Assessment   Pain Assessment  No/denies pain      Cognitive-Linquistic Treatment   Treatment focused on  Voice    Skilled Treatment  Daily Task #1 (Maximum sustained "ah"): Average 8 seconds, 85 dB. Daily Task 2 (Maximum fundamental frequency range): Highs: 15 high pitched "ah" (270 Hz). Lows: 15 low pitched "ah" (155 Hz). Daily task #3 (Maximum speech  loudness drill of functional phrases): Average 75 dB.  Hierarchal speech loudness drill: Read paragraphs, 75 dB.  Generate phrases to answer miscellaneous questions, 75 dB.  Homework: completed.  Off the cuff remarks: average 72 dB.        Assessment / Recommendations / Plan   Plan  Continue with current plan of care      Progression Toward Goals   Progression toward goals  Progressing toward goals       SLP Education - 10/31/19 0904    Education Details  LSVT-LOUD, callibration    Person(s) Educated  Patient    Methods  Explanation    Comprehension  Verbalized understanding         SLP Long Term Goals - 10/29/19 1112      SLP LONG TERM GOAL #1   Title  The patient will complete Daily Tasks (Maximum duration "ah", High/Lows, and Functional  Phrases) at average loudness of 80 dB and with loud, good quality voice.    Status  Partially Met    Target Date  11/08/19      SLP LONG TERM GOAL #2   Title  The patient will complete Hierarchal Speech Loudness reading drills (words/phrases, sentences, and paragraph) at average 75 dB and with loud, good quality voice.    Status  Partially Met    Target Date  11/08/19      SLP LONG TERM GOAL #3   Title  The patient will participate in conversation, maintaining average loudness of 75 dB and loud, good quality voice.    Status  Partially Met    Target Date  11/08/19      SLP LONG TERM GOAL #4   Title  The patient will complete homework daily.    Status  On-going    Target Date  11/08/19       Plan - 10/31/19 8299    Clinical Impression Statement  The patient is completing daily tasks and hierarchal speech drill tasks with loud, good quality voice with minimal shaping required for loudness, quality, and pitch range. He requires significant cues/shaping for duration, however he increased his time by 40% today. His off the cuff remarks remain significantly louder.    Speech Therapy Frequency  4x / week    Duration  4 weeks    Treatment/Interventions  SLP instruction and feedback;Patient/family education    Potential to Achieve Goals  Good    Potential Considerations  Ability to learn/carryover information;Previous level of function;Co-morbidities;Severity of impairments;Cooperation/participation level;Medical prognosis;Family/community support    SLP Home Exercise Plan  LSVT-LOUD Daily Homework    Consulted and Agree with Plan of Care  Patient       Patient will benefit from skilled therapeutic intervention in order to improve the following deficits and impairments:   Dysphonia    Problem List Patient Active Problem List   Diagnosis Date Noted  . Parkinson's disease ([place]) 04/06/2017  . 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 NSTEMI (non-ST elevated myocardial infarction) ([place]) 03/26/2014  .  Acute myocardial infarction, subendocardial infarction ([place]) 03/26/2014  . Atherosclerotic heart disease of native coronary artery with unstable angina pectoris ([place])   . Ischemic cardiomyopathy   . Essential hypertension   . Hyperlipidemia with target LDL less than 70   . Tobacco abuse   . History of colonic polyps 02/26/2013   Leroy Sea, MS/CCC- SLP  Lou Miner 10/31/2019, 9:06 AM  [place] MAIN [facility] SERVICES [place]  [place], [place], [address] Phone: [phone]   Fax:  [phone]   Name: [name] [name] MRN: [id number] Date of Birth: 12-10-1950

## 2019-10-31 NOTE — Therapy (Signed)
[place] MAIN [facility] SERVICES [address] [place], [place], [address] Phone: [phone]   Fax:  [phone]  Physical Therapy Treatment  Patient Details  Name: [name] [name] MRN: [id number] Date of Birth: Feb 28, 1951 Referring Provider (PT): Jennings Books   Encounter Date: 10/31/2019  PT End of Session - 10/31/19 1504    Visit Number  12    Number of Visits  17    Date for PT Re-Evaluation  11/12/19    PT Start Time  1500    PT Stop Time  1555    PT Time Calculation (min)  55 min    Equipment Utilized During Treatment  Gait belt    Activity Tolerance  Patient tolerated treatment well    Behavior During Therapy  WFL for tasks assessed/performed       Past Medical History:  Diagnosis Date  . Acute non-Q wave ST elevation myocardial infarction (STEMI) 9/13-14/2015  . Atherosclerotic heart disease of native coronary artery with unstable angina pectoris ([place])    a. 03/2014 NSTEMI/Cath: LM nl, LAD 11m(FFR 0.76), D1 1058mLCX 10058mCA 112m28m 35%  . Essential hypertension   . GERD (gastroesophageal reflux disease)   . Hyperlipidemia with target LDL less than 70   . Ischemic cardiomyopathy    a. 03/2014 EF 35% by LV gram.  . Peripheral vision loss    right eye   . 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 Tobacco abuse     Past Surgical History:  Procedure Laterality Date  . ADENOIDECTOMY    . CARDIAC CATHETERIZATION     a. 03/2014 NSTEMI/Cath: LM nl, LAD 6m 72m 0.76), D1 112m, 58m112m, R14m12m, EF81m  . COLONOSCOPY WITH PROPOFOL N/A 05/30/2018   Procedure: COLONOSCOPY WITH PROPOFOL;  Surgeon: Byrnett,Robert Bellowocation: [facility] ENDOSCOPY;  Service: Endoscopy;  Laterality: N/A;  . CORONARY ARTERY BYPASS GRAFT N/A 03/28/2014   Procedure: CORONARY ARTERY BYPASS GRAFTING (CABG) x  4 using left internal mammary artery and bilateral saphenous leg vein.;   Surgeon: ClarenceRexene Albertsocation: [facility];  [place]: Open Heart Surgery;  Laterality: N/A;  . INTRAOPERATIVE TRANSESOPHAGEAL ECHOCARDIOGRAM N/A 03/28/2014   Procedure: INTRAOPERATIVE TRANSESOPHAGEAL ECHOCARDIOGRAM;  Surgeon: ClarenceRexene Albertsocation: [facility];  [place]: Open Heart Surgery;  Laterality: N/A;    There were no vitals filed for this visit.  Subjective Assessment - 10/31/19 1503    Subjective  His back hurt last night from spreading mulch.    Pertinent History  Patient had bipass surgery and then the parkinsons began 2017. He has a resting tremmor. He does not use a walking device.He plays golf 2-3 times / week. He sometimes has difficulty with yard work, he has back pain that is intermittent.    How long can you sit comfortably?  unlimited    How long can you stand comfortably?  30 minutes    How long can you walk comfortably?  30 minutes level    Currently in Pain?  No/denies    Pain Score  0-No pain        Treatment:   Patient seen for LSVT Daily Session Maximal Daily Exercises for facilitation/coordination of movement Sustained movements are designed to rescale the amplitude of movement output for generalization to daily functional activities .Performed as follows  for 1 set of 10 repetitions each multidirectional sustained movements  1) Floor to ceiling , cues to hold for 10 seconds,needs modeling for correct positions , VC to reach out further and to reach up to the ceiling 2) Side to side multidirectional Repetitive movements performed in sitting and are designed to provide retraining effort needed for sustained muscle activation in tasks , cues to lean fwd and hold position x 10 counts: Patient does not move fwd<>sidesitting<> to fwd and is not able to transition or stretch out leg very far 3) Step and reach forward , cues for good knee flex, cues to move UE and LE together, cues to rotate  arms up to pronate his forearms 4) Step and reach backwards , cues for BUE  back, getting toe up and bending back knee 5) Step and reach sideways, cues to turn  head sideways, and turn head back to neutral, and to rotate arms and pronate forearms 6) Rock and reach forward/backward , cues to reach far fwd with UE's, patient not comfortable with feet apart , not rocking very far to get a good weight shift, not able to raise  heel or raise  toes much 7) Rock and reach sideways, cues for twist and look behind , only rotates side ways, unable to twist around or turn to look behind   functional component task with supervision 5 reps and simulated activities for: 1. Sit to stand x 10   needs cues to begin with UE up for correct start position  2.balance task with 1/2 foam and tapping to step x 10 , foam step ups x 68fr being able to ascend and descend a curb safely. 3. Cards shuffling x 3, dealing with wrist rotation from front of deck and back of deck, to be able to manage golf peg for golf ball 4.clothes pins with resistance for coordination of fingers and hands, to improve turning pages in a book 5.Purdue peg boardfor coordination of fingers, to improve finger coordination for using the phone Patient performed with instruction, verbal cues, tactile cues of therapist: goal: increase tissue extensibility, promote proper posture, improve mobility  Pt educated throughout session about proper posture and technique with exercises. Improved exercise technique, movement at target joints, use of target muscles after min to mod verbal, visual, tactile cues.                         PT Education - 10/31/19 1504    Education Details  LSVT BIG    Person(s) Educated  Patient    Methods  Explanation;Demonstration;Tactile cues;Verbal cues    Comprehension  Verbalized understanding;Returned demonstration;Verbal cues required;Tactile cues required;Need further instruction          PT Long Term Goals - 10/29/19 1455      PT LONG TERM GOAL #1   Title  Patient  will be independent in home exercise program to improve strength/mobility for better functional independence with ADLs.    Time  2    Period  Weeks    Status  Achieved      PT LONG TERM GOAL #2   Title  Patient will increase Berg Balance score by > 6 points to demonstrate decreased fall risk during functional activities.    Baseline  10/29/19=56/56    Time  4    Period  Weeks    Status  Achieved    Target Date  11/12/19      PT LONG TERM GOAL #  3   Title  Patient will be independent with ascend/descend 12 steps using single UE in step over step pattern without LOB.    Time  4    Period  Weeks    Status  Partially Met    Target Date  11/12/19      PT LONG TERM GOAL #4   Title  Patient will have no gait devialtions or path deviations during horizontal or vertical  head turns or gait speed changes.    Time  8    Period  Weeks    Status  Partially Met    Target Date  11/12/19            Plan - 10/31/19 1505    Clinical Impression Statement  Patient needs cueing for BIG swing and BIG amplitude.  Patient needs cueing for correct BIG arm swing. Patient improves with practice. Patient needs cuing to consistently swing her arms and also swing reciprocally. Patient needs cuing to swing UE's reciprocally with weight shift exercise and with backward stepping and correct UE positioning.   Stability/Clinical Decision Making  Stable/Uncomplicated    Rehab Potential  Good    PT Frequency  4x / week    PT Duration  4 weeks    PT Treatment/Interventions  Gait training;Therapeutic exercise;Therapeutic activities;Balance training;Neuromuscular re-education;Manual techniques    PT Next Visit Plan  LSVT BIG    PT Home Exercise Plan  LSVT BIG    Consulted and Agree with Plan of Care  Patient       Patient will benefit from skilled therapeutic intervention in order to improve the following deficits and impairments:  Abnormal gait, Decreased activity tolerance, Decreased strength, Difficulty  walking, Decreased balance, Decreased coordination, Decreased mobility, Impaired tone  Visit Diagnosis: Dysphonia  Difficulty in walking, not elsewhere classified  Other abnormalities of gait and mobility     Problem List Patient Active Problem List   Diagnosis Date Noted  . Parkinson's disease ([place]) 04/06/2017  . 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 NSTEMI (non-ST elevated myocardial infarction) ([place]) 03/26/2014  . Acute myocardial infarction, subendocardial infarction ([place]) 03/26/2014  . Atherosclerotic heart disease of native coronary artery with unstable angina pectoris ([place])   . Ischemic cardiomyopathy   . Essential hypertension   . Hyperlipidemia with target LDL less than 70   . Tobacco abuse   . History of colonic polyps 02/26/2013    Alanson Puls, [place] DPT 10/31/2019, 3:07 PM  [place] MAIN [facility] SERVICES [address] [place], [place], [address] Phone: [phone]   Fax:  [phone]  Name: OZIE LUPE MRN: [id number] Date of Birth: 1951/01/21

## 2019-11-04 ENCOUNTER — Encounter: Payer: Self-pay | Admitting: Physical Therapy

## 2019-11-04 ENCOUNTER — Ambulatory Visit: Payer: Medicare Other | Admitting: Speech Pathology

## 2019-11-04 ENCOUNTER — Ambulatory Visit: Payer: Medicare Other | Admitting: Physical Therapy

## 2019-11-04 ENCOUNTER — Other Ambulatory Visit: Payer: Self-pay

## 2019-11-04 DIAGNOSIS — R490 Dysphonia: Secondary | ICD-10-CM

## 2019-11-04 DIAGNOSIS — R2689 Other abnormalities of gait and mobility: Secondary | ICD-10-CM

## 2019-11-04 DIAGNOSIS — R262 Difficulty in walking, not elsewhere classified: Secondary | ICD-10-CM

## 2019-11-04 NOTE — Therapy (Signed)
[place] MAIN [facility] SERVICES [address] [place], [place], [address] Phone: [phone]   Fax:  [phone]  Physical Therapy Treatment  Patient Details  Name: [name] [name] MRN: [id number] Date of Birth: 09/02/1950 Referring Provider (PT): Jennings Books   Encounter Date: 11/04/2019  PT End of Session - 11/04/19 1505    Visit Number  13    Number of Visits  17    Date for PT Re-Evaluation  11/12/19    PT Start Time  1500    PT Stop Time  1555    PT Time Calculation (min)  55 min    Equipment Utilized During Treatment  Gait belt    Activity Tolerance  Patient tolerated treatment well    Behavior During Therapy  WFL for tasks assessed/performed       Past Medical History:  Diagnosis Date  . Acute non-Q wave ST elevation myocardial infarction (STEMI) 9/13-14/2015  . Atherosclerotic heart disease of native coronary artery with unstable angina pectoris ([place])    a. 03/2014 NSTEMI/Cath: LM nl, LAD 24m(FFR 0.76), D1 1039mLCX 10041mCA 160m73m 35%  . Essential hypertension   . GERD (gastroesophageal reflux disease)   . Hyperlipidemia with target LDL less than 70   . Ischemic cardiomyopathy    a. 03/2014 EF 35% by LV gram.  . Peripheral vision loss    right eye   . 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 Tobacco abuse     Past Surgical History:  Procedure Laterality Date  . ADENOIDECTOMY    . CARDIAC CATHETERIZATION     a. 03/2014 NSTEMI/Cath: LM nl, LAD 7m 30m 0.76), D1 160m, 56m160m, R15m60m, EF58m  . COLONOSCOPY WITH PROPOFOL N/A 05/30/2018   Procedure: COLONOSCOPY WITH PROPOFOL;  Surgeon: Byrnett,Robert Bellowocation: [facility] ENDOSCOPY;  Service: Endoscopy;  Laterality: N/A;  . CORONARY ARTERY BYPASS GRAFT N/A 03/28/2014   Procedure: CORONARY ARTERY BYPASS GRAFTING (CABG) x  4 using left internal mammary artery and bilateral saphenous leg vein.;   Surgeon: ClarenceRexene Albertsocation: [facility];  [place]: Open Heart Surgery;  Laterality: N/A;  . INTRAOPERATIVE TRANSESOPHAGEAL ECHOCARDIOGRAM N/A 03/28/2014   Procedure: INTRAOPERATIVE TRANSESOPHAGEAL ECHOCARDIOGRAM;  Surgeon: ClarenceRexene Albertsocation: [facility];  [place]: Open Heart Surgery;  Laterality: N/A;    There were no vitals filed for this visit.  Subjective Assessment - 11/04/19 1504    Subjective  Patient has no new concerns. Patient has nothing new, no new concerns.    Pertinent History  Patient had bipass surgery and then the parkinsons began 2017. He has a resting tremmor. He does not use a walking device.He plays golf 2-3 times / week. He sometimes has difficulty with yard work, he has back pain that is intermittent.    How long can you sit comfortably?  unlimited    How long can you stand comfortably?  30 minutes    How long can you walk comfortably?  30 minutes level    Currently in Pain?  No/denies    Pain Score  0-No pain        Treatment:   Patient seen for LSVT Daily Session Maximal Daily Exercises for facilitation/coordination of movement Sustained movements are designed to rescale the amplitude of movement output for generalization to daily functional  activities .Performed as follows for 1 set of 10 repetitions each multidirectional sustained movements  1) Floor to ceiling , cues to hold for 10 seconds,needs modeling for correct positions , VC to reach out further and to reach up to the ceiling 2) Side to side multidirectional Repetitive movements performed in sitting and are designed to provide retraining effort needed for sustained muscle activation in tasks , cues to lean fwd and hold position x 10 counts: Patient does not move fwd<>sidesitting<> to fwd and is not able to transition or stretch out leg very far 3) Step and reach forward , cues for good knee flex, cues to move UE and LE together, cues to rotate  arms up to pronate his forearms 4) Step and reach  backwards , cues for BUE back, getting toe up and bending back knee 5) Step and reach sideways, cues to turn  head sideways, and turn head back to neutral, and to rotate arms and pronate forearms 6) Rock and reach forward/backward , cues to reach far fwd with UE's, patient not comfortable with feet apart , not rocking very far to get a good weight shift, not able to raise  heel or raise  toes much 7) Rock and reach sideways, cues for twist and look behind , only rotates side ways, unable to twist around or turn to look behind   functional component task with supervision 5 reps and simulated activities for: 1. Sit to stand x 10   needs cues to begin with UE up for correct start position 2.balance task with 1/2 foam and tapping to step x 10 , foam step ups x 57fr being able to ascend and descend a curb safely. 3. Cards shuffling x 3, dealing with wrist rotation from front of deck and back of deck, to be able to manage golf peg for golf ball 4.clothes pins with resistancefor coordination of fingers and hands, to improve turning pages in a book 5.Purduepeg boardfor coordination of fingers, to improve finger coordination for using the phone  Patient ambulates with BIg arm swing x 1000 x 3  Patient performed with instruction, verbal cues, tactile cues of therapist: goal: increase tissue extensibility, promote proper posture, improve mobility  Pt educated throughout session about proper posture and technique with exercises. Improved exercise technique, movement at target joints, use of target muscles after min to mod verbal, visual, tactile cues.                        PT Education - 11/04/19 1504    Person(s) Educated  Patient    Methods  Explanation    Comprehension  Verbalized understanding          PT Long Term Goals - 10/29/19 1455      PT LONG TERM GOAL #1   Title  Patient will be independent in home exercise program to improve strength/mobility for better  functional independence with ADLs.    Time  2    Period  Weeks    Status  Achieved      PT LONG TERM GOAL #2   Title  Patient will increase Berg Balance score by > 6 points to demonstrate decreased fall risk during functional activities.    Baseline  10/29/19=56/56    Time  4    Period  Weeks    Status  Achieved    Target Date  11/12/19      PT LONG TERM GOAL #3   Title  Patient will be independent with ascend/descend 12 steps using single UE in step over step pattern without LOB.    Time  4    Period  Weeks    Status  Partially Met    Target Date  11/12/19      PT LONG TERM GOAL #4   Title  Patient will have no gait devialtions or path deviations during horizontal or vertical  head turns or gait speed changes.    Time  8    Period  Weeks    Status  Partially Met    Target Date  11/12/19            Plan - 11/04/19 1505    Clinical Impression Statement  Patient has better stepping pattern with less stopping and better posture. Patient has difficulty with finishing BIG and needs extra cuing to perform exercises with correct amplitude and speed. Patient has difficulty with turning his head and rotating trunk with weight shifting exercises.  Patient has fatigue with standing exercises and needs constant VC to have correct posture. Patient has loss of balance and needs UE support intermittently thorough out exercise. Patient has slowness of movement during rotation and beginning movements.   Stability/Clinical Decision Making  Stable/Uncomplicated    Rehab Potential  Good    PT Frequency  4x / week    PT Duration  4 weeks    PT Treatment/Interventions  Gait training;Therapeutic exercise;Therapeutic activities;Balance training;Neuromuscular re-education;Manual techniques    PT Next Visit Plan  LSVT BIG    PT Home Exercise Plan  LSVT BIG    Consulted and Agree with Plan of Care  Patient       Patient will benefit from skilled therapeutic intervention in order to improve the  following deficits and impairments:  Abnormal gait, Decreased activity tolerance, Decreased strength, Difficulty walking, Decreased balance, Decreased coordination, Decreased mobility, Impaired tone  Visit Diagnosis: Dysphonia  Difficulty in walking, not elsewhere classified  Other abnormalities of gait and mobility     Problem List Patient Active Problem List   Diagnosis Date Noted  . Parkinson's disease ([place]) 04/06/2017  . 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 NSTEMI (non-ST elevated myocardial infarction) ([place]) 03/26/2014  . Acute myocardial infarction, subendocardial infarction ([place]) 03/26/2014  . Atherosclerotic heart disease of native coronary artery with unstable angina pectoris ([place])   . Ischemic cardiomyopathy   . Essential hypertension   . Hyperlipidemia with target LDL less than 70   . Tobacco abuse   . History of colonic polyps 02/26/2013    Alanson Puls, [place] DPT 11/04/2019, 3:06 PM  [place] MAIN [facility] SERVICES [address] [place], [place], [address] Phone: [phone]   Fax:  [phone]  Name: [name] [name] MRN: [id number] Date of Birth: [birth year]

## 2019-11-05 ENCOUNTER — Ambulatory Visit: Payer: Medicare Other | Admitting: Speech Pathology

## 2019-11-05 ENCOUNTER — Encounter: Payer: Self-pay | Admitting: Speech Pathology

## 2019-11-05 ENCOUNTER — Other Ambulatory Visit: Payer: Self-pay

## 2019-11-05 ENCOUNTER — Encounter: Payer: Self-pay | Admitting: Physical Therapy

## 2019-11-05 ENCOUNTER — Ambulatory Visit: Payer: Medicare Other | Admitting: Physical Therapy

## 2019-11-05 DIAGNOSIS — R262 Difficulty in walking, not elsewhere classified: Secondary | ICD-10-CM

## 2019-11-05 DIAGNOSIS — R2689 Other abnormalities of gait and mobility: Secondary | ICD-10-CM

## 2019-11-05 DIAGNOSIS — R490 Dysphonia: Secondary | ICD-10-CM

## 2019-11-05 NOTE — Therapy (Addendum)
[place] MAIN [facility] SERVICES [address] [place], [place], [address] Phone: [phone]   Fax:  [phone]  Physical Therapy Treatment  Patient Details  Name: [name] [name] MRN: [id number] Date of Birth: 07/20/1950 Referring Provider (PT): Jennings Books   Encounter Date: 11/05/2019  PT End of Session - 11/05/19 1505    Visit Number  14    Number of Visits  17    Date for PT Re-Evaluation  11/12/19    PT Start Time  1500    PT Stop Time  1555    PT Time Calculation (min)  55 min    Equipment Utilized During Treatment  Gait belt    Activity Tolerance  Patient tolerated treatment well    Behavior During Therapy  WFL for tasks assessed/performed       Past Medical History:  Diagnosis Date  . Acute non-Q wave ST elevation myocardial infarction (STEMI) 9/13-14/2015  . Atherosclerotic heart disease of native coronary artery with unstable angina pectoris ([place])    a. 03/2014 NSTEMI/Cath: LM nl, LAD 25m(FFR 0.76), D1 1097mLCX 10010mCA 138m35m 35%  . Essential hypertension   . GERD (gastroesophageal reflux disease)   . Hyperlipidemia with target LDL less than 70   . Ischemic cardiomyopathy    a. 03/2014 EF 35% by LV gram.  . Peripheral vision loss    right eye   . 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 Tobacco abuse     Past Surgical History:  Procedure Laterality Date  . ADENOIDECTOMY    . CARDIAC CATHETERIZATION     a. 03/2014 NSTEMI/Cath: LM nl, LAD 66m 11m 0.76), D1 138m, 43m138m, R54m38m, EF34m  . COLONOSCOPY WITH PROPOFOL N/A 05/30/2018   Procedure: COLONOSCOPY WITH PROPOFOL;  Surgeon: Byrnett,Robert Bellowocation: [facility] ENDOSCOPY;  Service: Endoscopy;  Laterality: N/A;  . CORONARY ARTERY BYPASS GRAFT N/A 03/28/2014   Procedure: CORONARY ARTERY BYPASS GRAFTING (CABG) x  4 using left internal mammary artery and bilateral saphenous leg vein.;   Surgeon: ClarenceRexene Albertsocation: [facility];  [place]: Open Heart Surgery;  Laterality: N/A;  . INTRAOPERATIVE TRANSESOPHAGEAL ECHOCARDIOGRAM N/A 03/28/2014   Procedure: INTRAOPERATIVE TRANSESOPHAGEAL ECHOCARDIOGRAM;  Surgeon: ClarenceRexene Albertsocation: [facility];  [place]: Open Heart Surgery;  Laterality: N/A;    There were no vitals filed for this visit.  Subjective Assessment - 11/05/19 1504    Subjective  Patient has no new concerns. Patient has nothing new, no new concerns.    Pertinent History  Patient had bipass surgery and then the parkinsons began 2017. He has a resting tremmor. He does not use a walking device.He plays golf 2-3 times / week. He sometimes has difficulty with yard work, he has back pain that is intermittent.    How long can you sit comfortably?  unlimited    How long can you stand comfortably?  30 minutes    How long can you walk comfortably?  30 minutes level    Currently in Pain?  Yes    Pain Score  1     Pain Location  Back    Pain Orientation  Left    Pain Descriptors / Indicators  Aching    Pain Type  Chronic pain    Pain Onset  More than  a month ago    Pain Frequency  Constant    Aggravating Factors   yard work    Pain Relieving Factors  rest    Effect of Pain on Daily Activities  no effect    Multiple Pain Sites  No        Treatment:   Patient seen for LSVT Daily Session Maximal Daily Exercises for facilitation/coordination of movement Sustained movements are designed to rescale the amplitude of movement output for generalization to daily functional activities .Performed as follows for 1 set of 10 repetitions each multidirectional sustained movements  1) Floor to ceiling , cues to hold for 10 seconds,needs modeling for correct positions , VC to reach out further and to reach up to the ceiling 2) Side to side multidirectional Repetitive movements performed in sitting and are designed to provide retraining effort needed for sustained muscle activation  in tasks , cues to lean fwd and hold position x 10 counts: Patient does not move fwd<>sidesitting<> to fwd and is not able to transition or stretch out leg very far 3) Step and reach forward , cues for good knee flex, cues to move UE and LE together, cues to rotate  arms up to pronate his forearms 4) Step and reach backwards , cues for BUE back, getting toe up and bending back knee 5) Step and reach sideways, cues to turn  head sideways, and turn head back to neutral, and to rotate arms and pronate forearms 6) Rock and reach forward/backward , cues to reach far fwd with UE's, patient not comfortable with feet apart , not rocking very far to get a good weight shift, not able to raise  heel or raise  toes much 7) Rock and reach sideways, cues for twist and look behind , only rotates side ways, unable to twist around or turn to look behind   functional component task with supervision 5 reps and simulated activities for: 1. Sit to stand x 10   needs cues to begin with UE up for correct start position 2.balance task with four square fwd/bwd, side to side  x 10 for being able to ascend and descend a curb safely. 3. Cards shuffling x 3, dealing with wrist rotation from front of deck and back of deck, to be able to manage golf peg for golf ball 4.coins into coin counter with resistancefor coordination of fingers and hands, to improve turning pages in a book 5.medium peg boardwith various shapesfor coordination of fingers, to improve finger coordination for using the phone Patient performed with instruction, verbal cues, tactile cues of therapist: goal: increase tissue extensibility, promote proper posture, improve mobility   Pt educated throughout session about proper posture and technique with exercises. Improved exercise technique, movement at target joints, use of target muscles after min to mod verbal, visual, tactile cues.                        PT Education - 11/05/19 1505     Education Details  LSVT BIG    Person(s) Educated  Patient    Methods  Explanation    Comprehension  Verbalized understanding          PT Long Term Goals - 10/29/19 1455      PT LONG TERM GOAL #1   Title  Patient will be independent in home exercise program to improve strength/mobility for better functional independence with ADLs.    Time  2    Period  Weeks    Status  Achieved      PT LONG TERM GOAL #2   Title  Patient will increase Berg Balance score by > 6 points to demonstrate decreased fall risk during functional activities.    Baseline  10/29/19=56/56    Time  4    Period  Weeks    Status  Achieved    Target Date  11/12/19      PT LONG TERM GOAL #3   Title  Patient will be independent with ascend/descend 12 steps using single UE in step over step pattern without LOB.    Time  4    Period  Weeks    Status  Partially Met    Target Date  11/12/19      PT LONG TERM GOAL #4   Title  Patient will have no gait devialtions or path deviations during horizontal or vertical  head turns or gait speed changes.    Time  8    Period  Weeks    Status  Partially Met    Target Date  11/12/19            Plan - 11/05/19 1505    Clinical Impression Statement  Patient continues to demonstrates less incoordination of movement with select exercises such as rock and reach and stepping backwards. Patient responds well to verbal and tactile cues to correct form and technique. Patient is able to catch mistakes in technique with incorrect positions and is able remember the start and finish positions. Motor control of LE much improved.  Muscle fatigue but no major pain complaints.   Stability/Clinical Decision Making  Stable/Uncomplicated    Rehab Potential  Good    PT Frequency  4x / week    PT Duration  4 weeks    PT Treatment/Interventions  Gait training;Therapeutic exercise;Therapeutic activities;Balance training;Neuromuscular re-education;Manual techniques    PT Next Visit Plan   LSVT BIG    PT Home Exercise Plan  LSVT BIG    Consulted and Agree with Plan of Care  Patient       Patient will benefit from skilled therapeutic intervention in order to improve the following deficits and impairments:  Abnormal gait, Decreased activity tolerance, Decreased strength, Difficulty walking, Decreased balance, Decreased coordination, Decreased mobility, Impaired tone  Visit Diagnosis: Dysphonia  Difficulty in walking, not elsewhere classified  Other abnormalities of gait and mobility     Problem List Patient Active Problem List   Diagnosis Date Noted  . Parkinson's disease ([place]) 04/06/2017  . 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 NSTEMI (non-ST elevated myocardial infarction) ([place]) 03/26/2014  . Acute myocardial infarction, subendocardial infarction ([place]) 03/26/2014  . Atherosclerotic heart disease of native coronary artery with unstable angina pectoris ([place])   . Ischemic cardiomyopathy   . Essential hypertension   . Hyperlipidemia with target LDL less than 70   . Tobacco abuse   . History of colonic polyps 02/26/2013    Alanson Puls , [place] DPT 11/05/2019, 3:06 PM  [place] MAIN [facility] SERVICES [address] [place], [place], [address] Phone: [phone]   Fax:  [phone]  Name: [name] [name] MRN: [id number] Date of Birth: 11-24-1950

## 2019-11-05 NOTE — Therapy (Signed)
[place] MAIN [facility] SERVICES [address] [place], [place], [address] Phone: [phone]   Fax:  [phone]  Speech Language Pathology Treatment  Patient Details  Name: [name] [name] MRN: [id number] Date of Birth: 1950-12-19 Referring Provider (SLP): Dr. Manuella Ghazi   Encounter Date: 11/04/2019  End of Session - 11/05/19 0847    Visit Number  14    Number of Visits  17    Date for SLP Re-Evaluation  11/08/19    Authorization Type  Medicare    Authorization Time Period  Start 10/29/2019    Authorization - Visit Number  4    Authorization - Number of Visits  10    SLP Start Time  1600    SLP Stop Time   1700    SLP Time Calculation (min)  60 min    Activity Tolerance  Patient tolerated treatment well       Past Medical History:  Diagnosis Date  . Acute non-Q wave ST elevation myocardial infarction (STEMI) 9/13-14/2015  . Atherosclerotic heart disease of native coronary artery with unstable angina pectoris ([place])    a. 03/2014 NSTEMI/Cath: LM nl, LAD 46m(FFR 0.76), D1 1058mLCX 10042mCA 125m73m 35%  . Essential hypertension   . GERD (gastroesophageal reflux disease)   . Hyperlipidemia with target LDL less than 70   . Ischemic cardiomyopathy    a. 03/2014 EF 35% by LV gram.  . Peripheral vision loss    right eye   . 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 Tobacco abuse     Past Surgical History:  Procedure Laterality Date  . ADENOIDECTOMY    . CARDIAC CATHETERIZATION     a. 03/2014 NSTEMI/Cath: LM nl, LAD 64m 25m 0.76), D1 125m, 41m125m, R18m25m, EF97m  . COLONOSCOPY WITH PROPOFOL N/A 05/30/2018   Procedure: COLONOSCOPY WITH PROPOFOL;  Surgeon: Byrnett,Robert Bellowocation: [facility] ENDOSCOPY;  Service: Endoscopy;  Laterality: N/A;  . CORONARY ARTERY BYPASS GRAFT N/A 03/28/2014   Procedure: CORONARY ARTERY BYPASS GRAFTING (CABG) x  4 using left  internal mammary artery and bilateral saphenous leg vein.;  Surgeon: ClarenceRexene Albertsocation: [facility];  [place]: Open Heart Surgery;  Laterality: N/A;  . INTRAOPERATIVE TRANSESOPHAGEAL ECHOCARDIOGRAM N/A 03/28/2014   Procedure: INTRAOPERATIVE TRANSESOPHAGEAL ECHOCARDIOGRAM;  Surgeon: ClarenceRexene Albertsocation: [facility];  [place]: Open Heart Surgery;  Laterality: N/A;    There were no vitals filed for this visit.  Subjective Assessment - 11/05/19 0846    Subjective  Pt engaged and working hard, reports that people are not asking his "What?" as often            ADULT SLP TREATMENT - 11/05/19 0001      General Information   Behavior/Cognition  Alert;Cooperative;Pleasant mood    HPI  Parkinson's diease      Treatment Provided   Treatment provided  Cognitive-Linquistic      Pain Assessment   Pain Assessment  No/denies pain      Cognitive-Linquistic Treatment   Treatment focused on  Voice    Skilled Treatment  Daily Task #1 (Maximum sustained "ah"): Average 8 seconds, 85 dB. Daily Task 2 (Maximum fundamental frequency range): Highs: 15 high pitched "ah" (270 Hz). Lows: 15 low pitched "ah" (155 Hz). Daily task #3 (Maximum speech  loudness drill of functional phrases): Average 75 dB.  Hierarchal speech loudness drill: Read paragraphs, 75 dB.  Generate phrases to answer miscellaneous questions, 75 dB.  Homework: completed.  Off the cuff remarks: average 72 dB.        Assessment / Recommendations / Plan   Plan  Continue with current plan of care      Progression Toward Goals   Progression toward goals  Progressing toward goals       SLP Education - 11/05/19 0847    Education Details  LSVT-LOUD, calibration    Person(s) Educated  Patient    Methods  Explanation    Comprehension  Verbalized understanding         SLP Long Term Goals - 10/29/19 1112      SLP LONG TERM GOAL #1   Title  The patient will complete Daily Tasks (Maximum duration "ah", High/Lows, and Functional  Phrases) at average loudness of 80 dB and with loud, good quality voice.    Status  Partially Met    Target Date  11/08/19      SLP LONG TERM GOAL #2   Title  The patient will complete Hierarchal Speech Loudness reading drills (words/phrases, sentences, and paragraph) at average 75 dB and with loud, good quality voice.    Status  Partially Met    Target Date  11/08/19      SLP LONG TERM GOAL #3   Title  The patient will participate in conversation, maintaining average loudness of 75 dB and loud, good quality voice.    Status  Partially Met    Target Date  11/08/19      SLP LONG TERM GOAL #4   Title  The patient will complete homework daily.    Status  On-going    Target Date  11/08/19       Plan - 11/05/19 0849    Clinical Impression Statement  The patient is completing daily tasks and hierarchal speech drill tasks with loud, good quality voice independently. He demonstrates increasing generalization into conversation.    Speech Therapy Frequency  4x / week    Duration  4 weeks    Treatment/Interventions  SLP instruction and feedback;Patient/family education    Potential to Achieve Goals  Good    Potential Considerations  Ability to learn/carryover information;Previous level of function;Co-morbidities;Severity of impairments;Cooperation/participation level;Medical prognosis;Family/community support    SLP Home Exercise Plan  LSVT-LOUD Daily Homework    Consulted and Agree with Plan of Care  Patient       Patient will benefit from skilled therapeutic intervention in order to improve the following deficits and impairments:   Dysphonia    Problem List Patient Active Problem List   Diagnosis Date Noted  . Parkinson's disease ([place]) 04/06/2017  . 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 NSTEMI (non-ST elevated myocardial infarction) ([place]) 03/26/2014  . Acute myocardial infarction, subendocardial infarction ([place]) 03/26/2014  . Atherosclerotic heart disease of native coronary artery with  unstable angina pectoris ([place])   . Ischemic cardiomyopathy   . Essential hypertension   . Hyperlipidemia with target LDL less than 70   . Tobacco abuse   . History of colonic polyps 02/26/2013   Leroy Sea, MS/CCC- SLP  Lou Miner 11/05/2019, 8:49 AM  [place] MAIN [facility] SERVICES [address] [place], [place], [address] Phone: [phone]   Fax:  [phone]   Name: [name] [name] MRN: [id number] Date of Birth: 1950-12-14

## 2019-11-05 NOTE — Therapy (Signed)
[place] MAIN [facility] SERVICES [address] [place], [place], [address] Phone: [phone]   Fax:  [phone]  Speech Language Pathology Treatment  Patient Details  Name: [name] [name] MRN: [id number] Date of Birth: [birth year] Referring Provider (SLP): Dr. Manuella Ghazi   Encounter Date: 11/05/2019  End of Session - 11/05/19 1700    Visit Number  15    Number of Visits  17    Date for SLP Re-Evaluation  11/08/19    Authorization Type  Medicare    Authorization Time Period  Start 10/29/2019    Authorization - Visit Number  5    Authorization - Number of Visits  10    SLP Start Time  1600    SLP Stop Time   1700    SLP Time Calculation (min)  60 min    Activity Tolerance  Patient tolerated treatment well       Past Medical History:  Diagnosis Date  . Acute non-Q wave ST elevation myocardial infarction (STEMI) 9/13-14/2015  . Atherosclerotic heart disease of native coronary artery with unstable angina pectoris ([place])    a. 03/2014 NSTEMI/Cath: LM nl, LAD 54m(FFR 0.76), D1 105mLCX 10022mCA 149m92m 35%  . Essential hypertension   . GERD (gastroesophageal reflux disease)   . Hyperlipidemia with target LDL less than 70   . Ischemic cardiomyopathy    a. 03/2014 EF 35% by LV gram.  . Peripheral vision loss    right eye   . 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 Tobacco abuse     Past Surgical History:  Procedure Laterality Date  . ADENOIDECTOMY    . CARDIAC CATHETERIZATION     a. 03/2014 NSTEMI/Cath: LM nl, LAD 52m 16m 0.76), D1 149m, 20m149m, R47m49m, EF40m  . COLONOSCOPY WITH PROPOFOL N/A 05/30/2018   Procedure: COLONOSCOPY WITH PROPOFOL;  Surgeon: Byrnett,Robert Bellowocation: [facility];  Service: Endoscopy;  Laterality: N/A;  . CORONARY ARTERY BYPASS GRAFT N/A 03/28/2014   Procedure: CORONARY ARTERY BYPASS GRAFTING (CABG) x  4 using left  internal mammary artery and bilateral saphenous leg vein.;  Surgeon: ClarenceRexene Albertsocation: [facility];  [place]: Open Heart Surgery;  Laterality: N/A;  . INTRAOPERATIVE TRANSESOPHAGEAL ECHOCARDIOGRAM N/A 03/28/2014   Procedure: INTRAOPERATIVE TRANSESOPHAGEAL ECHOCARDIOGRAM;  Surgeon: ClarenceRexene Albertsocation: [facility];  [place]: Open Heart Surgery;  Laterality: N/A;    There were no vitals filed for this visit.  Subjective Assessment - 11/05/19 1659    Subjective  Pt engaged and working hard, reports that people are not asking his "What?" as often            ADULT SLP TREATMENT - 11/05/19 1658      General Information   Behavior/Cognition  Alert;Cooperative;Pleasant mood    HPI  Parkinson's diease      Treatment Provided   Treatment provided  Cognitive-Linquistic      Pain Assessment   Pain Assessment  No/denies pain      Cognitive-Linquistic Treatment   Treatment focused on  Voice    Skilled Treatment  Daily Task #1 (Maximum sustained "ah"): Average 9 seconds, 85 dB. Daily Task 2 (Maximum fundamental frequency range): Highs: 15 high pitched "ah" (270 Hz). Lows: 15 low pitched "ah" (155 Hz). Daily task #3 (Maximum speech  loudness drill of functional phrases): Average 75 dB.  Hierarchal speech loudness drill: Read paragraphs, 75 dB.  Conversation, 75 dB.  Homework: completed.  Off the cuff remarks: average 75 dB.        Assessment / Recommendations / Plan   Plan  Continue with current plan of care      Progression Toward Goals   Progression toward goals  Progressing toward goals       SLP Education - 11/05/19 1659    Education Details  LSVT-LOUD    Person(s) Educated  Patient    Methods  Explanation    Comprehension  Verbalized understanding         SLP Long Term Goals - 10/29/19 1112      SLP LONG TERM GOAL #1   Title  The patient will complete Daily Tasks (Maximum duration "ah", High/Lows, and Functional Phrases) at average loudness of 80 dB and with  loud, good quality voice.    Status  Partially Met    Target Date  11/08/19      SLP LONG TERM GOAL #2   Title  The patient will complete Hierarchal Speech Loudness reading drills (words/phrases, sentences, and paragraph) at average 75 dB and with loud, good quality voice.    Status  Partially Met    Target Date  11/08/19      SLP LONG TERM GOAL #3   Title  The patient will participate in conversation, maintaining average loudness of 75 dB and loud, good quality voice.    Status  Partially Met    Target Date  11/08/19      SLP LONG TERM GOAL #4   Title  The patient will complete homework daily.    Status  On-going    Target Date  11/08/19       Plan - 11/05/19 1700    Clinical Impression Statement  The patient is completing daily tasks and hierarchal speech drill tasks with loud, good quality voice independently. He demonstrates generalization into conversation.    Speech Therapy Frequency  4x / week    Duration  4 weeks    Treatment/Interventions  SLP instruction and feedback;Patient/family education    Potential to Achieve Goals  Good    Potential Considerations  Ability to learn/carryover information;Previous level of function;Co-morbidities;Severity of impairments;Cooperation/participation level;Medical prognosis;Family/community support    SLP Home Exercise Plan  LSVT-LOUD Daily Homework    Consulted and Agree with Plan of Care  Patient       Patient will benefit from skilled therapeutic intervention in order to improve the following deficits and impairments:   Dysphonia    Problem List Patient Active Problem List   Diagnosis Date Noted  . Parkinson's disease ([place]) 04/06/2017  . 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 NSTEMI (non-ST elevated myocardial infarction) ([place]) 03/26/2014  . Acute myocardial infarction, subendocardial infarction ([place]) 03/26/2014  . Atherosclerotic heart disease of native coronary artery with unstable angina pectoris ([place])   . Ischemic cardiomyopathy    . Essential hypertension   . Hyperlipidemia with target LDL less than 70   . Tobacco abuse   . History of colonic polyps 02/26/2013   Leroy Sea, MS/CCC- SLP  Lou Miner 11/05/2019, 5:01 PM  [place] MAIN [facility] SERVICES [address] [place], [place], [address] Phone: [phone]   Fax:  [phone]   Name: [name] [name] MRN: [id number] Date of Birth: 11-23-1950

## 2019-11-06 ENCOUNTER — Ambulatory Visit: Payer: Medicare Other | Admitting: Speech Pathology

## 2019-11-06 ENCOUNTER — Encounter: Payer: Self-pay | Admitting: Physical Therapy

## 2019-11-06 ENCOUNTER — Other Ambulatory Visit: Payer: Self-pay

## 2019-11-06 ENCOUNTER — Ambulatory Visit: Payer: Medicare Other | Admitting: Physical Therapy

## 2019-11-06 DIAGNOSIS — R491 Aphonia: Secondary | ICD-10-CM

## 2019-11-06 DIAGNOSIS — R2689 Other abnormalities of gait and mobility: Secondary | ICD-10-CM

## 2019-11-06 DIAGNOSIS — R262 Difficulty in walking, not elsewhere classified: Secondary | ICD-10-CM

## 2019-11-06 DIAGNOSIS — R490 Dysphonia: Secondary | ICD-10-CM | POA: Diagnosis not present

## 2019-11-06 NOTE — Therapy (Signed)
[facility] MAIN [facility] SERVICES [place], [place], [address] Phone: [phone]   Fax:  [phone]  Physical Therapy Treatment  Patient Details  Name: [name] [name] MRN: [id number] Date of Birth: [birth year] Referring Provider (PT): Jennings Books   Encounter Date: 11/06/2019  PT End of Session - 11/06/19 1516    Visit Number  15    Number of Visits  17    Date for PT Re-Evaluation  11/12/19    PT Start Time  1502    PT Stop Time  1558    PT Time Calculation (min)  56 min    Equipment Utilized During Treatment  Gait belt    Activity Tolerance  Patient tolerated treatment well    Behavior During Therapy  WFL for tasks assessed/performed       Past Medical History:  Diagnosis Date  . Acute non-Q wave ST elevation myocardial infarction (STEMI) 9/13-14/2015  . Atherosclerotic heart disease of native coronary artery with unstable angina pectoris ([place])    a. 03/2014 NSTEMI/Cath: LM nl, LAD 15m(FFR 0.76), D1 1062mLCX 10018mCA 126m74m 35%  . Essential hypertension   . GERD (gastroesophageal reflux disease)   . Hyperlipidemia with target LDL less than 70   . Ischemic cardiomyopathy    a. 03/2014 EF 35% by LV gram.  . Peripheral vision loss    right eye   . 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 Tobacco abuse     Past Surgical History:  Procedure Laterality Date  . ADENOIDECTOMY    . CARDIAC CATHETERIZATION     a. 03/2014 NSTEMI/Cath: LM nl, LAD 7m 74m 0.76), D1 126m, 8m126m, R79m26m, EF89m  . COLONOSCOPY WITH PROPOFOL N/A 05/30/2018   Procedure: COLONOSCOPY WITH PROPOFOL;  Surgeon: Byrnett,Robert Bellowocation: [facility];  Service: Endoscopy;  Laterality: N/A;  . CORONARY ARTERY BYPASS GRAFT N/A 03/28/2014   Procedure: CORONARY ARTERY BYPASS GRAFTING (CABG) x  4 using left internal mammary artery and bilateral saphenous leg vein.;  Surgeon:  ClarenceRexene Albertsocation: [facility];  [place]: Open Heart Surgery;  Laterality: N/A;  . INTRAOPERATIVE TRANSESOPHAGEAL ECHOCARDIOGRAM N/A 03/28/2014   Procedure: INTRAOPERATIVE TRANSESOPHAGEAL ECHOCARDIOGRAM;  Surgeon: ClarenceRexene Albertsocation: [facility];  [place]: Open Heart Surgery;  Laterality: N/A;    There were no vitals filed for this visit.  Subjective Assessment - 11/06/19 1515    Subjective  Patient has no new concerns. Patient has nothing new, no new concerns.    Currently in Pain?  Yes    Pain Score  5     Pain Location  Back    Pain Orientation  Left    Pain Descriptors / Indicators  Aching    Pain Type  Chronic pain    Pain Frequency  Constant    Aggravating Factors   yard work    Pain Relieving Factors  rest    Effect of Pain on Daily Activities  no effect    Multiple Pain Sites  No             Treatment:   Patient seen for LSVT Daily Session Maximal Daily Exercises for facilitation/coordination of movement Sustained movements are designed to rescale the amplitude of movement output for generalization to daily functional activities .Performed as follows for 1  set of 10 repetitions each multidirectional sustained movements  1) Floor to ceiling , cues to hold for 10 seconds,needs modeling for correct positions , VC to reach out further and to reach up to the ceiling 2) Side to side multidirectional Repetitive movements performed in sitting and are designed to provide retraining effort needed for sustained muscle activation in tasks , cues to lean fwd and hold position x 10 counts: Patient does not move fwd<>sidesitting<> to fwd and is not able to transition or stretch out leg very far 3) Step and reach forward , cues for good knee flex, cues to move UE and LE together, cues to rotate  arms up to pronate his forearms 4) Step and reach backwards , cues for BUE back, getting toe up and bending back knee 5) Step and reach sideways, cues to turn  head sideways, and turn  head back to neutral, and to rotate arms and pronate forearms 6) Rock and reach forward/backward , cues to reach far fwd with UE's, patient not comfortable with feet apart , not rocking very far to get a good weight shift, not able to raise  heel or raise  toes much 7) Rock and reach sideways, cues for twist and look behind , only rotates side ways, unable to twist around or turn to look behind   functional component task with supervision 5 reps and simulated activities for: 1. Sit to stand x 10   needs cues to begin with UE up for correct start position 2.balance task with four square fwd/bwd, side to side  x 10 for being able to ascend and descend a curb safely. 3. Cards shuffling x 3, dealing with wrist rotation from front of deck and back of deck, to be able to manage golf peg for golf ball 4 clothes pins  with resistancefor coordination of fingers and hands, to improve turning pages in a book 5.smallpeg boardwith various shapesfor coordination of fingers, to improve finger coordination for using the phone Patient performed with instruction, verbal cues, tactile cues of therapist: goal:increase tissue extensibility, promote proper posture, improve mobility Patient performed with instruction, verbal cues, tactile cues of therapist: goal: increase tissue extensibility, promote proper posture, improve mobility  Pt educated throughout session about proper posture and technique with exercises. Improved exercise technique, movement at target joints, use of target muscles after min to mod verbal, visual, tactile cues.[place]                    PT Education - 11/06/19 1516    Education Details  LSVT BIG    Person(s) Educated  Patient    Methods  Explanation    Comprehension  Verbalized understanding          PT Long Term Goals - 10/29/19 1455      PT LONG TERM GOAL #1   Title  Patient will be independent in home exercise program to improve strength/mobility for  better functional independence with ADLs.    Time  2    Period  Weeks    Status  Achieved      PT LONG TERM GOAL #2   Title  Patient will increase Berg Balance score by > 6 points to demonstrate decreased fall risk during functional activities.    Baseline  10/29/19=56/56    Time  4    Period  Weeks    Status  Achieved    Target Date  11/12/19      PT LONG TERM GOAL #  3   Title  Patient will be independent with ascend/descend 12 steps using single UE in step over step pattern without LOB.    Time  4    Period  Weeks    Status  Partially Met    Target Date  11/12/19      PT LONG TERM GOAL #4   Title  Patient will have no gait devialtions or path deviations during horizontal or vertical  head turns or gait speed changes.    Time  8    Period  Weeks    Status  Partially Met    Target Date  11/12/19            Plan - 11/06/19 1517    Clinical Impression Statement  Patient continues to demonstrates less incoordination of movement with select exercises such as rock and reach and stepping backwards. Patient responds well to verbal and tactile cues to correct form and technique. Patient is able to catch mistakes in technique with incorrect positions and is able remember the start and finish positions. Motor control of LE much improved.  Muscle fatigue but no major pain complaints.   Stability/Clinical Decision Making  Stable/Uncomplicated    Rehab Potential  Good    PT Frequency  4x / week    PT Duration  4 weeks    PT Treatment/Interventions  Gait training;Therapeutic exercise;Therapeutic activities;Balance training;Neuromuscular re-education;Manual techniques    PT Next Visit Plan  LSVT BIG    PT Home Exercise Plan  LSVT BIG    Consulted and Agree with Plan of Care  Patient       Patient will benefit from skilled therapeutic intervention in order to improve the following deficits and impairments:  Abnormal gait, Decreased activity tolerance, Decreased strength, Difficulty  walking, Decreased balance, Decreased coordination, Decreased mobility, Impaired tone  Visit Diagnosis: Dysphonia  Difficulty in walking, not elsewhere classified  Other abnormalities of gait and mobility     Problem List Patient Active Problem List   Diagnosis Date Noted  . Parkinson's disease ([place]) 04/06/2017  . 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 NSTEMI (non-ST elevated myocardial infarction) ([place]) 03/26/2014  . Acute myocardial infarction, subendocardial infarction ([place]) 03/26/2014  . Atherosclerotic heart disease of native coronary artery with unstable angina pectoris ([place])   . Ischemic cardiomyopathy   . Essential hypertension   . Hyperlipidemia with target LDL less than 70   . Tobacco abuse   . History of colonic polyps 02/26/2013    Alanson Puls, [place] DPT 11/06/2019, 3:18 PM  [place] MAIN [facility] SERVICES [address] [place], [place], [address] Phone: [phone]   Fax:  [phone]  Name: [name] [name] MRN: [id number] Date of Birth: 09-19-1950

## 2019-11-07 ENCOUNTER — Other Ambulatory Visit: Payer: Self-pay

## 2019-11-07 ENCOUNTER — Encounter: Payer: Self-pay | Admitting: Physical Therapy

## 2019-11-07 ENCOUNTER — Encounter: Payer: Self-pay | Admitting: Speech Pathology

## 2019-11-07 ENCOUNTER — Ambulatory Visit: Payer: Medicare Other | Admitting: Physical Therapy

## 2019-11-07 ENCOUNTER — Ambulatory Visit: Payer: Medicare Other | Admitting: Speech Pathology

## 2019-11-07 DIAGNOSIS — R490 Dysphonia: Secondary | ICD-10-CM | POA: Diagnosis not present

## 2019-11-07 DIAGNOSIS — R491 Aphonia: Secondary | ICD-10-CM

## 2019-11-07 DIAGNOSIS — R2689 Other abnormalities of gait and mobility: Secondary | ICD-10-CM

## 2019-11-07 DIAGNOSIS — R262 Difficulty in walking, not elsewhere classified: Secondary | ICD-10-CM

## 2019-11-07 NOTE — Therapy (Signed)
[place] MAIN [facility] SERVICES [address] [place], [place], [address] Phone: [phone]   Fax:  [phone]  Physical Therapy Treatment  Patient Details  Name: [name] [name] MRN: [id number] Date of Birth: [birth year] Referring Provider (PT): Jennings Books   Encounter Date: 11/07/2019  PT End of Session - 11/07/19 1500    Visit Number  16    Number of Visits  17    Date for PT Re-Evaluation  11/12/19    PT Start Time  1500    PT Stop Time  1555    PT Time Calculation (min)  55 min    Equipment Utilized During Treatment  Gait belt    Activity Tolerance  Patient tolerated treatment well    Behavior During Therapy  WFL for tasks assessed/performed       Past Medical History:  Diagnosis Date  . Acute non-Q wave ST elevation myocardial infarction (STEMI) 9/13-14/2015  . Atherosclerotic heart disease of native coronary artery with unstable angina pectoris ([place])    a. 03/2014 NSTEMI/Cath: LM nl, LAD 47m(FFR 0.76), D1 1046mLCX 10033mCA 161m71m 35%  . Essential hypertension   . GERD (gastroesophageal reflux disease)   . Hyperlipidemia with target LDL less than 70   . Ischemic cardiomyopathy    a. 03/2014 EF 35% by LV gram.  . Peripheral vision loss    right eye   . 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 Tobacco abuse     Past Surgical History:  Procedure Laterality Date  . ADENOIDECTOMY    . CARDIAC CATHETERIZATION     a. 03/2014 NSTEMI/Cath: LM nl, LAD 19m 24m 0.76), D1 161m, 61m161m, R85m61m, EF87m  . COLONOSCOPY WITH PROPOFOL N/A 05/30/2018   Procedure: COLONOSCOPY WITH PROPOFOL;  Surgeon: Byrnett,Robert Bellowocation: [facility];  Service: Endoscopy;  Laterality: N/A;  . CORONARY ARTERY BYPASS GRAFT N/A 03/28/2014   Procedure: CORONARY ARTERY BYPASS GRAFTING (CABG) x  4 using left internal mammary artery and bilateral saphenous leg vein.;   Surgeon: ClarenceRexene Albertsocation: [facility];  [place]: Open Heart Surgery;  Laterality: N/A;  . INTRAOPERATIVE TRANSESOPHAGEAL ECHOCARDIOGRAM N/A 03/28/2014   Procedure: INTRAOPERATIVE TRANSESOPHAGEAL ECHOCARDIOGRAM;  Surgeon: ClarenceRexene Albertsocation: [facility];  [place]: Open Heart Surgery;  Laterality: N/A;    There were no vitals filed for this visit.  Subjective Assessment - 11/07/19 1458    Subjective  Patient has no new concerns. Patient has nothing new, no new concerns.    Pertinent History  Patient had bipass surgery and then the parkinsons began 2017. He has a resting tremmor. He does not use a walking device.He plays golf 2-3 times / week. He sometimes has difficulty with yard work, he has back pain that is intermittent.    Currently in Pain?  Yes    Pain Score  4     Pain Location  Back    Pain Descriptors / Indicators  Aching    Pain Type  Chronic pain    Pain Onset  More than a month ago    Pain Frequency  Intermittent    Aggravating Factors   yard work    Pain Relieving Factors  rest    Effect of Pain on Daily Activities  no effect    Multiple  Pain Sites  No           Treatment:   Patient seen for LSVT Daily Session Maximal Daily Exercises for facilitation/coordination of movement Sustained movements are designed to rescale the amplitude of movement output for generalization to daily functional activities .Performed as follows for 1 set of 10 repetitions each multidirectional sustained movements  1) Floor to ceiling , cues to hold for 10 seconds,needs modeling for correct positions , VC to reach out further and to reach up to the ceiling 2) Side to side multidirectional Repetitive movements performed in sitting and are designed to provide retraining effort needed for sustained muscle activation in tasks , cues to lean fwd and hold position x 10 counts: Patient does not move fwd<>sidesitting<> to fwd and is not able to transition or stretch out leg very far 3) Step  and reach forward , cues for good knee flex, cues to move UE and LE together, cues to rotate  arms up to pronate his forearms 4) Step and reach backwards , cues for BUE back, getting toe up and bending back knee 5) Step and reach sideways, cues to turn  head sideways, and turn head back to neutral, and to rotate arms and pronate forearms 6) Rock and reach forward/backward , cues to reach far fwd with UE's, patient not comfortable with feet apart , not rocking very far to get a good weight shift, not able to raise  heel or raise  toes much 7) Rock and reach sideways, cues for twist and look behind , only rotates side ways, unable to twist around or turn to look behind   functional component task with supervision 5 reps and simulated activities for: 1. Sit to stand x 10   needs cues to begin with UE up for correct start position 2.balance task with 1/2 foam and tapping to step x 10 , 4 square diagonalsx 1fr being able to ascend and descend a curb safely. 3. Cards shuffling x 3, dealing with wrist rotation from front of deck and back of deck, to be able to manage golf peg for golf ball 4.clothes pins with resistancefor coordination of fingers and hands, to improve turning pages in a book 5.multi directional peg boardfor coordination of fingers, to improve finger coordination for using the phone Patient performed with instruction, verbal cues, tactile cues of therapist: goal: increase tissue extensibility, promote proper posture, improve mobility  Pt educated throughout session about proper posture and technique with exercises. Improved exercise technique, movement at target joints, use of target muscles after min to mod verbal, visual, tactile cues.                      PT Education - 11/07/19 1500    Education Details  LSVT BIG    Person(s) Educated  Patient    Methods  Explanation    Comprehension  Verbalized understanding;Returned demonstration;Need further  instruction          PT Long Term Goals - 10/29/19 1455      PT LONG TERM GOAL #1   Title  Patient will be independent in home exercise program to improve strength/mobility for better functional independence with ADLs.    Time  2    Period  Weeks    Status  Achieved      PT LONG TERM GOAL #2   Title  Patient will increase Berg Balance score by > 6 points to demonstrate decreased fall risk during functional activities.  Baseline  10/29/19=56/56    Time  4    Period  Weeks    Status  Achieved    Target Date  11/12/19      PT LONG TERM GOAL #3   Title  Patient will be independent with ascend/descend 12 steps using single UE in step over step pattern without LOB.    Time  4    Period  Weeks    Status  Partially Met    Target Date  11/12/19      PT LONG TERM GOAL #4   Title  Patient will have no gait devialtions or path deviations during horizontal or vertical  head turns or gait speed changes.    Time  8    Period  Weeks    Status  Partially Met    Target Date  11/12/19            Plan - 11/07/19 1501    Clinical Impression Statement  Patient continues to demonstrates less incoordination of movement with select exercises such as rock and reach and stepping backwards. Patient responds well to verbal and tactile cues to correct form and technique. Patient is able to catch mistakes in technique with incorrect positions and is able remember the start and finish positions. Motor control of LE much improved.  Muscle fatigue but no major pain complaints.   Stability/Clinical Decision Making  Stable/Uncomplicated    Rehab Potential  Good    PT Frequency  4x / week    PT Duration  4 weeks    PT Treatment/Interventions  Gait training;Therapeutic exercise;Therapeutic activities;Balance training;Neuromuscular re-education;Manual techniques    PT Next Visit Plan  LSVT BIG    PT Home Exercise Plan  LSVT BIG    Consulted and Agree with Plan of Care  Patient       Patient will  benefit from skilled therapeutic intervention in order to improve the following deficits and impairments:  Abnormal gait, Decreased activity tolerance, Decreased strength, Difficulty walking, Decreased balance, Decreased coordination, Decreased mobility, Impaired tone  Visit Diagnosis: Difficulty in walking, not elsewhere classified  Other abnormalities of gait and mobility     Problem List Patient Active Problem List   Diagnosis Date Noted  . Parkinson's disease ([place]) 04/06/2017  . 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 NSTEMI (non-ST elevated myocardial infarction) ([place]) 03/26/2014  . Acute myocardial infarction, subendocardial infarction ([place]) 03/26/2014  . Atherosclerotic heart disease of native coronary artery with unstable angina pectoris ([place])   . Ischemic cardiomyopathy   . Essential hypertension   . Hyperlipidemia with target LDL less than 70   . Tobacco abuse   . History of colonic polyps 02/26/2013    Alanson Puls, [place] DPT 11/07/2019, 3:02 PM  [place] MAIN [facility] SERVICES [address] [place], [place], [address] Phone: [phone]   Fax:  [phone]  Name: ZAC TORTI MRN: [id number] Date of Birth: Jul 03, 1951

## 2019-11-07 NOTE — Therapy (Signed)
[place] MAIN [facility] SERVICES [address] [place], [place], [address] Phone: [phone]   Fax:  [phone]  Speech Language Pathology Treatment  Patient Details  Name: [name] [name] MRN: [id number] Date of Birth: [birth year] Referring Provider (SLP): Dr. Manuella Ghazi   Encounter Date: 11/06/2019  End of Session - 11/07/19 1144    Visit Number  16    Number of Visits  17    Date for SLP Re-Evaluation  11/08/19    Authorization Type  Medicare    Authorization Time Period  Start 10/29/2019    Authorization - Visit Number  5    Authorization - Number of Visits  10    SLP Start Time  1600    SLP Stop Time   1700    SLP Time Calculation (min)  60 min    Activity Tolerance  Patient tolerated treatment well       Past Medical History:  Diagnosis Date  . Acute non-Q wave ST elevation myocardial infarction (STEMI) 9/13-14/2015  . Atherosclerotic heart disease of native coronary artery with unstable angina pectoris ([place])    a. 03/2014 NSTEMI/Cath: LM nl, LAD 18m(FFR 0.76), D1 1050mLCX 10058mCA 1102m41m 35%  . Essential hypertension   . GERD (gastroesophageal reflux disease)   . Hyperlipidemia with target LDL less than 70   . Ischemic cardiomyopathy    a. 03/2014 EF 35% by LV gram.  . Peripheral vision loss    right eye   . 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 Tobacco abuse     Past Surgical History:  Procedure Laterality Date  . ADENOIDECTOMY    . CARDIAC CATHETERIZATION     a. 03/2014 NSTEMI/Cath: LM nl, LAD 28m 39m 0.76), D1 1102m, 106m1102m, R61m102m, EF4m  . COLONOSCOPY WITH PROPOFOL N/A 05/30/2018   Procedure: COLONOSCOPY WITH PROPOFOL;  Surgeon: Byrnett,Robert Bellowocation: [facility] ENDOSCOPY;  Service: Endoscopy;  Laterality: N/A;  . CORONARY ARTERY BYPASS GRAFT N/A 03/28/2014   Procedure: CORONARY ARTERY BYPASS GRAFTING (CABG) x  4 using left  internal mammary artery and bilateral saphenous leg vein.;  Surgeon: ClarenceRexene Albertsocation: [facility] OR;  [place]: Open Heart Surgery;  Laterality: N/A;  . INTRAOPERATIVE TRANSESOPHAGEAL ECHOCARDIOGRAM N/A 03/28/2014   Procedure: INTRAOPERATIVE TRANSESOPHAGEAL ECHOCARDIOGRAM;  Surgeon: ClarenceRexene Albertsocation: [facility];  [place]: Open Heart Surgery;  Laterality: N/A;    There were no vitals filed for this visit.  Subjective Assessment - 11/07/19 1143    Subjective  Pt engaged and working hard, reports that people are not asking his "What?" as often    Currently in Pain?  No/denies            ADULT SLP TREATMENT - 11/07/19 0001      General Information   Behavior/Cognition  Alert;Cooperative;Pleasant mood    HPI  Parkinson's diease      Treatment Provided   Treatment provided  Cognitive-Linquistic      Pain Assessment   Pain Assessment  No/denies pain      Cognitive-Linquistic Treatment   Treatment focused on  Voice    Skilled Treatment   Daily Task #1 (Maximum sustained "ah"): Average 8 seconds, 85 dB. Daily Task 2 (Maximum fundamental frequency range): Highs: 15 high pitched "ah" (265 Hz). Lows: 15 low  pitched "ah" (155 Hz). Daily task #3 (Maximum speech loudness drill of functional phrases): Average 75 dB.  Hierarchal speech loudness drill: Read paragraphs, 75 dB.  Conversation, 75 dB.  Homework: completed.  Off the cuff remarks: average 75 dB.      Assessment / Recommendations / Plan   Plan  Continue with current plan of care      Progression Toward Goals   Progression toward goals  Progressing toward goals       SLP Education - 11/07/19 1144    Education Details  LSVT-LOUD    Person(s) Educated  Patient    Methods  Explanation    Comprehension  Verbalized understanding         SLP Long Term Goals - 10/29/19 1112      SLP LONG TERM GOAL #1   Title  The patient will complete Daily Tasks (Maximum duration "ah", High/Lows, and Functional Phrases) at  average loudness of 80 dB and with loud, good quality voice.    Status  Partially Met    Target Date  11/08/19      SLP LONG TERM GOAL #2   Title  The patient will complete Hierarchal Speech Loudness reading drills (words/phrases, sentences, and paragraph) at average 75 dB and with loud, good quality voice.    Status  Partially Met    Target Date  11/08/19      SLP LONG TERM GOAL #3   Title  The patient will participate in conversation, maintaining average loudness of 75 dB and loud, good quality voice.    Status  Partially Met    Target Date  11/08/19      SLP LONG TERM GOAL #4   Title  The patient will complete homework daily.    Status  On-going    Target Date  11/08/19       Plan - 11/07/19 1144    Clinical Impression Statement  The patient is completing daily tasks and hierarchal speech drill tasks with loud, good quality voice independently. He demonstrates generalization into conversation.    Speech Therapy Frequency  4x / week    Duration  4 weeks    Treatment/Interventions  SLP instruction and feedback;Patient/family education    Potential to Achieve Goals  Good    Potential Considerations  Ability to learn/carryover information;Previous level of function;Co-morbidities;Severity of impairments;Cooperation/participation level;Medical prognosis;Family/community support    SLP Home Exercise Plan  LSVT-LOUD Daily Homework    Consulted and Agree with Plan of Care  Patient       Patient will benefit from skilled therapeutic intervention in order to improve the following deficits and impairments:   [place]    Problem List Patient Active Problem List   Diagnosis Date Noted  . Parkinson's disease ([place]) 04/06/2017  . 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 NSTEMI (non-ST elevated myocardial infarction) ([place]) 03/26/2014  . Acute myocardial infarction, subendocardial infarction ([place]) 03/26/2014  . Atherosclerotic heart disease of native coronary artery with unstable angina pectoris ([place])    . Ischemic cardiomyopathy   . Essential hypertension   . Hyperlipidemia with target LDL less than 70   . Tobacco abuse   . History of colonic polyps 02/26/2013    Janesha Brissette B. Rutherford Nail M.S., CCC-SLP, [place] Pathologist Rehabilitation Services Office [place] MAIN [facility] SERVICES [address] [place], [place], [address] Phone: [phone]   Fax:  [phone]   Name: [name] [name] MRN: [id number] Date of Birth: 09-Nov-1950

## 2019-11-08 ENCOUNTER — Encounter: Payer: Self-pay | Admitting: Speech Pathology

## 2019-11-08 NOTE — Therapy (Signed)
[place] MAIN [facility] SERVICES [address] [place], [place], [address] Phone: [phone]   Fax:  [phone]  Speech Language Pathology Treatment DISCHARGE Summary  Patient Details  Name: [name] [name] MRN: [id number] Date of Birth: 1951-01-21 Referring Provider (SLP): Dr. Manuella Ghazi   Encounter Date: 11/07/2019  End of Session - 11/08/19 1153    Visit Number  17    Number of Visits  17    Date for SLP Re-Evaluation  11/08/19    Authorization Type  Medicare    Authorization Time Period  Start 10/29/2019    Authorization - Visit Number  5    Authorization - Number of Visits  10    SLP Start Time  1600    SLP Stop Time   1700    SLP Time Calculation (min)  60 min    Activity Tolerance  Patient tolerated treatment well       Past Medical History:  Diagnosis Date  . Acute non-Q wave ST elevation myocardial infarction (STEMI) 9/13-14/2015  . Atherosclerotic heart disease of native coronary artery with unstable angina pectoris ([place])    a. 03/2014 NSTEMI/Cath: LM nl, LAD 42m(FFR 0.76), D1 1065mLCX 10042mCA 145m61m 35%  . Essential hypertension   . GERD (gastroesophageal reflux disease)   . Hyperlipidemia with target LDL less than 70   . Ischemic cardiomyopathy    a. 03/2014 EF 35% by LV gram.  . Peripheral vision loss    right eye   . 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 Tobacco abuse     Past Surgical History:  Procedure Laterality Date  . ADENOIDECTOMY    . CARDIAC CATHETERIZATION     a. 03/2014 NSTEMI/Cath: LM nl, LAD 26m 35m 0.76), D1 145m, 22m145m, R75m45m, EF50m  . COLONOSCOPY WITH PROPOFOL N/A 05/30/2018   Procedure: COLONOSCOPY WITH PROPOFOL;  Surgeon: Byrnett,Robert Bellowocation: [facility] ENDOSCOPY;  Service: Endoscopy;  Laterality: N/A;  . CORONARY ARTERY BYPASS GRAFT N/A 03/28/2014   Procedure: CORONARY ARTERY BYPASS GRAFTING (CABG) x   4 using left internal mammary artery and bilateral saphenous leg vein.;  Surgeon: ClarenceRexene Albertsocation: [facility];  [place]: Open Heart Surgery;  Laterality: N/A;  . INTRAOPERATIVE TRANSESOPHAGEAL ECHOCARDIOGRAM N/A 03/28/2014   Procedure: INTRAOPERATIVE TRANSESOPHAGEAL ECHOCARDIOGRAM;  Surgeon: ClarenceRexene Albertsocation: [facility];  [place]: Open Heart Surgery;  Laterality: N/A;    There were no vitals filed for this visit.  Subjective Assessment - 11/08/19 1153    Subjective  Pt engaged and working hard, reports that people are not asking his "What?" as often    Patient is accompained by:  Family member    Currently in Pain?  No/denies            ADULT SLP TREATMENT - 11/08/19 0001      General Information   Behavior/Cognition  Alert;Cooperative;Pleasant mood    HPI  Parkinson's diease      Treatment Provided   Treatment provided  Cognitive-Linquistic      Pain Assessment   Pain Assessment  No/denies pain      Cognitive-Linquistic Treatment   Treatment focused on  Voice    Skilled Treatment   Daily Task #1 (Maximum sustained "ah"): Average 8 seconds, 85 dB. Daily Task 2 (Maximum fundamental  frequency range): Highs: 15 high pitched "ah" (265 Hz). Lows: 15 low pitched "ah" (155 Hz). Daily task #3 (Maximum speech loudness drill of functional phrases): Average 75 dB.  Hierarchal speech loudness drill: Read paragraphs, 75 dB.  Conversation, 75 dB.  Homework: completed.  Off the cuff remarks: average 75 dB.      Assessment / Recommendations / Plan   Plan  All goals met      Progression Toward Goals   Progression toward goals  Goals met, education completed, patient discharged from SLP       SLP Education - 11/08/19 1153    Education Details  LSVT-LOUD    Person(s) Educated  Patient    Methods  Explanation;Demonstration    Comprehension  Verbalized understanding         SLP Long Term Goals - 11/08/19 1155      SLP LONG TERM GOAL #1   Title  The patient will  complete Daily Tasks (Maximum duration "ah", High/Lows, and Functional Phrases) at average loudness of 80 dB and with loud, good quality voice.    Status  Achieved      SLP LONG TERM GOAL #2   Title  The patient will complete Hierarchal Speech Loudness reading drills (words/phrases, sentences, and paragraph) at average 75 dB and with loud, good quality voice.    Status  Achieved      SLP LONG TERM GOAL #3   Title  The patient will participate in conversation, maintaining average loudness of 75 dB and loud, good quality voice.    Status  Achieved      SLP LONG TERM GOAL #4   Title  The patient will complete homework daily.    Status  Achieved       Plan - 11/08/19 1154    Clinical Impression Statement  The patient has successfully completed daily tasks and hierarchal speech drill tasks with loud, good quality voice independently. He has demonstrated generalization into conversation. At this time, he has completed LSVT LOUD.    Speech Therapy Frequency  --   N/A   Duration  --   N/A   Treatment/Interventions  SLP instruction and feedback;Patient/family education    SLP Home Exercise Plan  LSVT-LOUD Daily Homework    Consulted and Agree with Plan of Care  Patient         Problem List Patient Active Problem List   Diagnosis Date Noted  . Parkinson's disease ([place]) 04/06/2017  . 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 NSTEMI (non-ST elevated myocardial infarction) ([place]) 03/26/2014  . Acute myocardial infarction, subendocardial infarction ([place]) 03/26/2014  . Atherosclerotic heart disease of native coronary artery with unstable angina pectoris ([place])   . Ischemic cardiomyopathy   . Essential hypertension   . Hyperlipidemia with target LDL less than 70   . Tobacco abuse   . History of colonic polyps 02/26/2013   Karee Christopherson B. Rutherford Nail M.S., CCC-SLP, [place] Pathologist Rehabilitation Services Office [place] MAIN [facility]  SERVICES [address] [place], [place], [address] Phone: [phone]   Fax:  [phone]   Name: [name] [name] MRN: [id number] Date of Birth: [birth year]

## 2019-11-11 ENCOUNTER — Ambulatory Visit: Payer: Medicare Other | Attending: Neurology | Admitting: Physical Therapy

## 2019-11-11 ENCOUNTER — Other Ambulatory Visit: Payer: Self-pay

## 2019-11-11 ENCOUNTER — Encounter: Payer: Self-pay | Admitting: Physical Therapy

## 2019-11-11 DIAGNOSIS — R2689 Other abnormalities of gait and mobility: Secondary | ICD-10-CM | POA: Diagnosis present

## 2019-11-11 DIAGNOSIS — R262 Difficulty in walking, not elsewhere classified: Secondary | ICD-10-CM | POA: Insufficient documentation

## 2019-11-11 NOTE — Therapy (Signed)
[place] MAIN [facility] SERVICES [address] [place], [place], [address] Phone: [phone]   Fax:  [phone]  Physical Therapy Treatment Discharge summary  Patient Details  Name: [name] [name] MRN: [id number] Date of Birth: 28-Aug-1950 Referring Provider (PT): Jennings Books   Encounter Date: 11/11/2019  PT End of Session - 11/11/19 1524    Visit Number  17    Number of Visits  17    Date for PT Re-Evaluation  11/12/19    Equipment Utilized During Treatment  Gait belt    Activity Tolerance  Patient tolerated treatment well    Behavior During Therapy  [facility] for tasks assessed/performed       Past Medical History:  Diagnosis Date  . Acute non-Q wave ST elevation myocardial infarction (STEMI) 9/13-14/2015  . Atherosclerotic heart disease of native coronary artery with unstable angina pectoris ([place])    a. 03/2014 NSTEMI/Cath: LM nl, LAD 44m(FFR 0.76), D1 1063mLCX 10062mCA 137m69m 35%  . Essential hypertension   . GERD (gastroesophageal reflux disease)   . Hyperlipidemia with target LDL less than 70   . Ischemic cardiomyopathy    a. 03/2014 EF 35% by LV gram.  . Peripheral vision loss    right eye   . 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 Tobacco abuse     Past Surgical History:  Procedure Laterality Date  . ADENOIDECTOMY    . CARDIAC CATHETERIZATION     a. 03/2014 NSTEMI/Cath: LM nl, LAD 66m 86m 0.76), D1 137m, 50m137m, R18m37m, EF26m  . COLONOSCOPY WITH PROPOFOL N/A 05/30/2018   Procedure: COLONOSCOPY WITH PROPOFOL;  Surgeon: Byrnett,Robert Bellowocation: [facility] ENDOSCOPY;  Service: Endoscopy;  Laterality: N/A;  . CORONARY ARTERY BYPASS GRAFT N/A 03/28/2014   Procedure: CORONARY ARTERY BYPASS GRAFTING (CABG) x  4 using left internal mammary artery and bilateral saphenous leg vein.;  Surgeon: ClarenceRexene Albertsocation: [facility] OR;  [place]: Open  Heart Surgery;  Laterality: N/A;  . INTRAOPERATIVE TRANSESOPHAGEAL ECHOCARDIOGRAM N/A 03/28/2014   Procedure: INTRAOPERATIVE TRANSESOPHAGEAL ECHOCARDIOGRAM;  Surgeon: ClarenceRexene Albertsocation: [facility];  [place]: Open Heart Surgery;  Laterality: N/A;    There were no vitals filed for this visit.  Subjective Assessment - 11/11/19 1523    Subjective  Patient has no new concerns. Patient has nothing new, no new concerns.    Pertinent History  Patient had bipass surgery and then the parkinsons began 2017. He has a resting tremmor. He does not use a walking device.He plays golf 2-3 times / week. He sometimes has difficulty with yard work, he has back pain that is intermittent.    How long can you sit comfortably?  unlimited    How long can you stand comfortably?  30 minutes    How long can you walk comfortably?  30 minutes level    Currently in Pain?  No/denies    Pain Score  0-No pain          Treatment:   Patient seen for LSVT Daily Session Maximal Daily Exercises for facilitation/coordination of movement Sustained movements are designed to rescale the amplitude of movement output for generalization to daily functional activities .Performed as follows for 1 set of 10 repetitions each multidirectional sustained movements  1) Floor to ceiling , cues to  hold for 10 seconds,needs modeling for correct positions , VC to reach out further and to reach up to the ceiling 2) Side to side multidirectional Repetitive movements performed in sitting and are designed to provide retraining effort needed for sustained muscle activation in tasks , cues to lean fwd and hold position x 10 counts: Patient does not move fwd<>sidesitting<> to fwd and is not able to transition or stretch out leg very far 3) Step and reach forward , cues for good knee flex, cues to move UE and LE together, cues to rotate  arms up to pronate his forearms 4) Step and reach backwards , cues for BUE back, getting toe up and bending back  knee 5) Step and reach sideways, cues to turn  head sideways, and turn head back to neutral, and to rotate arms and pronate forearms 6) Rock and reach forward/backward , cues to reach far fwd with UE's, patient not comfortable with feet apart , not rocking very far to get a good weight shift, not able to raise  heel or raise  toes much 7) Rock and reach sideways, cues for twist and look behind , only rotates side ways, unable to twist around or turn to look behind   functional component task with supervision 5 reps and simulated activities for: 1. Sit to stand x 10   needs cues to begin with UE up for correct start position 2balance task with 1/2 foam and tapping to step x 10 , 4 square diagonalsx 6fr being able to ascend and descend a curb safely. 3. Cards shuffling x 3, dealing with wrist rotation from front of deck and back of deck, to be able to manage golf peg for golf ball 4.clothes pins with resistancefor coordination of fingers and hands, to improve turning pages in a book 5.multi directional peg boardfor coordination of fingers, to improve finger coordination for using the phone Patient performed with instruction, verbal cues, tactile cues of therapist: goal: increase tissue extensibility, promote proper posture, improve mobility  Pt educated throughout session about proper posture and technique with exercises. Improved exercise technique, movement at target joints, use of target muscles after min to mod verbal, visual, tactile cues.                       PT Education - 11/11/19 1523    Education Details  LSVT BIG    Person(s) Educated  Patient    Methods  Explanation    Comprehension  Verbalized understanding          PT Long Term Goals - 11/11/19 1528      PT LONG TERM GOAL #1   Title  Patient will be independent in home exercise program to improve strength/mobility for better functional independence with ADLs.    Time  2    Period  Weeks     Status  Achieved      PT LONG TERM GOAL #2   Title  Patient will increase Berg Balance score by > 6 points to demonstrate decreased fall risk during functional activities.    Baseline  10/29/19=56/56    Time  4    Period  Weeks    Status  Achieved      PT LONG TERM GOAL #3   Title  Patient will be independent with ascend/descend 12 steps using single UE in step over step pattern without LOB.    Time  4    Period  Weeks  Status  Partially Met      PT LONG TERM GOAL #4   Title  Patient will have no gait devialtions or path deviations during horizontal or vertical  head turns or gait speed changes.    Time  8    Period  Weeks    Status  Achieved    Target Date  11/12/19            Plan - 11/11/19 1524    Clinical Impression Statement  Patient needs cueing for BIG swing and BIG amplitude.  Patient needs cueing for correct BIG arm swing. Patient improves with practice. Patient needs cuing to consistently swing her arms and also swing reciprocally. Patient needs cuing to swing UE's reciprocally with weight shift exercise and with backward stepping and correct UE positioning.   Stability/Clinical Decision Making  Stable/Uncomplicated    Rehab Potential  Good    PT Frequency  4x / week    PT Duration  4 weeks    PT Treatment/Interventions  Gait training;Therapeutic exercise;Therapeutic activities;Balance training;Neuromuscular re-education;Manual techniques    PT Next Visit Plan  LSVT BIG    PT Home Exercise Plan  LSVT BIG    Consulted and Agree with Plan of Care  Patient       Patient will benefit from skilled therapeutic intervention in order to improve the following deficits and impairments:  Abnormal gait, Decreased activity tolerance, Decreased strength, Difficulty walking, Decreased balance, Decreased coordination, Decreased mobility, Impaired tone  Visit Diagnosis: Difficulty in walking, not elsewhere classified  Other abnormalities of gait and mobility     Problem  List Patient Active Problem List   Diagnosis Date Noted  . Parkinson's disease ([place]) 04/06/2017  . 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 NSTEMI (non-ST elevated myocardial infarction) ([place]) 03/26/2014  . Acute myocardial infarction, subendocardial infarction ([place]) 03/26/2014  . Atherosclerotic heart disease of native coronary artery with unstable angina pectoris ([place])   . Ischemic cardiomyopathy   . Essential hypertension   . Hyperlipidemia with target LDL less than 70   . Tobacco abuse   . History of colonic polyps 02/26/2013    Alanson Puls, [place] DPT 11/11/2019, 3:34 PM  [place] MAIN [facility] SERVICES [address] [place], [place], [address] Phone: [phone]   Fax:  [phone]  Name: [name] [name] MRN: [id number] Date of Birth: September 25, 1950

## 2020-01-23 ENCOUNTER — Ambulatory Visit (INDEPENDENT_AMBULATORY_CARE_PROVIDER_SITE_OTHER): Payer: Medicare Other | Admitting: Nurse Practitioner

## 2020-01-23 ENCOUNTER — Other Ambulatory Visit: Payer: Self-pay

## 2020-01-23 ENCOUNTER — Encounter: Payer: Self-pay | Admitting: Nurse Practitioner

## 2020-01-23 VITALS — BP 124/60 | HR 56 | Ht 72.0 in | Wt 197.2 lb

## 2020-01-23 DIAGNOSIS — I10 Essential (primary) hypertension: Secondary | ICD-10-CM | POA: Diagnosis not present

## 2020-01-23 DIAGNOSIS — E785 Hyperlipidemia, unspecified: Secondary | ICD-10-CM

## 2020-01-23 DIAGNOSIS — I255 Ischemic cardiomyopathy: Secondary | ICD-10-CM

## 2020-01-23 DIAGNOSIS — I5020 Unspecified systolic (congestive) heart failure: Secondary | ICD-10-CM | POA: Diagnosis not present

## 2020-01-23 DIAGNOSIS — I2510 Atherosclerotic heart disease of native coronary artery without angina pectoris: Secondary | ICD-10-CM | POA: Diagnosis not present

## 2020-01-23 NOTE — Patient Instructions (Signed)
Medication Instructions: Your physician recommends that you continue on your current medications as directed. Please refer to the Current Medication list given to you today.  *If you need a refill on your cardiac medications before your next appointment, please call your pharmacy*   Lab Work:none ordered If you have labs (blood work) drawn today and your tests are completely normal, you will receive your results only by: Marland Kitchen MyChart Message (if you have MyChart) OR . A paper copy in the mail If you have any lab test that is abnormal or we need to change your treatment, we will call you to review the results.   Testing/Procedures:none ordered  Follow-Up: At [facility], you and your health needs are our priority.  As part of our continuing mission to provide you with exceptional heart care, we have created designated Provider Care Teams.  These Care Teams include your primary Cardiologist (physician) and Advanced Practice Providers (APPs -  Physician Assistants and Nurse Practitioners) who all work together to provide you with the care you need, when you need it.  We recommend signing up for the patient portal called "MyChart".  Sign up information is provided on this After Visit Summary.  MyChart is used to connect with patients for Virtual Visits (Telemedicine).  Patients are able to view lab/test results, encounter notes, upcoming appointments, etc.  Non-urgent messages can be sent to your provider as well.   To learn more about what you can do with MyChart, go to [email or web address].    Your next appointment:  12 month(s)  The format for your next appointment:   In Person  Provider:    You may see Ida Rogue, MD or Murray Hodgkins, NP

## 2020-01-23 NOTE — Progress Notes (Signed)
Office Visit    Patient Name: [name] [name] Date of Encounter: 01/23/2020  Primary Care Provider:  Albina Billet, MD Primary Cardiologist:  Ida Rogue, MD  Chief Complaint    69 year old male with a history of CAD status post CABG in September 2015, ischemic cardiomyopathy, heart failure with improved EF (50-55% April 2020), hypertension, hyperlipidemia, lumbar radiculitis with neurogenic claudication, remote tobacco abuse, Parkinson's, sleep apnea on CPAP, and GERD, who presents for follow-up of CAD and ischemic cardiomyopathy.  Past Medical History    Past Medical History:  Diagnosis Date  . Acute non-Q wave ST elevation myocardial infarction (STEMI) 9/13-14/2015  . Atherosclerotic heart disease of native coronary artery with unstable angina pectoris ([place])    a. 03/2014 NSTEMI/Cath: LM nl, LAD 54m (FFR 0.76), D1 119m, LCX 157m, RCA 162m, EF 35%; b. 03/2014 CABG x 4 (LIMA->LAD, VG->Diag, VG->OM, VG->PDA.  Marland Kitchen Essential hypertension   . GERD (gastroesophageal reflux disease)   . HFimpEF (heart failure with improved ejection fraction) ([place])    a. 03/2014 LV gram: EF 35%; b. 10/2018 Echo: EF 50-55%, mild LVH. Mildly dil LA. Mild MVP, mild to mod MR. Mild AI.  Marland Kitchen Hyperlipidemia with target LDL less than 70   . Ischemic cardiomyopathy    a. 03/2014 EF 35% by LV gram; b. 10/2018 Echo: EF 50-55%.  . Peripheral vision loss    right eye   . 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 Tobacco abuse    Past Surgical History:  Procedure Laterality Date  . ADENOIDECTOMY    . CARDIAC CATHETERIZATION     a. 03/2014 NSTEMI/Cath: LM nl, LAD 2m (FFR 0.76), D1 163m, LCX 129m, RCA 123m, EF 35%  . COLONOSCOPY WITH PROPOFOL N/A 05/30/2018   Procedure: COLONOSCOPY WITH PROPOFOL;  Surgeon: Robert Bellow, MD;  Location: [facility];  Service: Endoscopy;  Laterality: N/A;  . CORONARY ARTERY BYPASS GRAFT N/A  03/28/2014   Procedure: CORONARY ARTERY BYPASS GRAFTING (CABG) x  4 using left internal mammary artery and bilateral saphenous leg vein.;  Surgeon: Rexene Alberts, MD;  Location: [place];  Service: Open Heart Surgery;  Laterality: N/A;  . INTRAOPERATIVE TRANSESOPHAGEAL ECHOCARDIOGRAM N/A 03/28/2014   Procedure: INTRAOPERATIVE TRANSESOPHAGEAL ECHOCARDIOGRAM;  Surgeon: Rexene Alberts, MD;  Location: [place];  Service: Open Heart Surgery;  Laterality: N/A;    Allergies  Allergies  Allergen Reactions  . Other Anxiety, Other (See Comments), Hives and Nausea And Vomiting  . Sulfa Antibiotics Hives and Nausea And Vomiting  . Morphine Anxiety  . Morphine And Related Anxiety    History of Present Illness    69 year old male with above complex past medical history including coronary artery disease status post non-STEMI in September 2015 with catheterization revealing severe multivessel CAD resulting in coronary artery bypass grafting x4.  EF previously as low as 30 to 35% but echo in April 2020 showed improved EF of 50-55% with mild to moderate mitral regurgitation.  Other history includes hypertension, hyperlipidemia, lumbar radiculitis with neurogenic claudication, remote tobacco abuse, Parkinson's, sleep apnea on CPAP, and GERD.  He was last seen in cardiology clinic in February 2020, at which time he was doing well.    Since his last visit, he has continued to do well from a cardiovascular standpoint.  He plays golf at least twice a week and does not experience chest  pain or dyspnea.  He does have bilateral calf and thigh discomfort after walking long periods but notes that this is stable since 2019, when he underwent ABIs which were normal.  Anything, he feels as though his symptoms are improved.  Yesterday, he was playing golf and it was very hot out and he does not think he was adequately hydrating.  He was very very sweaty and became very lightheaded and thought he might pass out.  Symptoms resolved  after lying down and air conditioning for a little bit.  No recurrence.  He denies palpitations, PND, orthopnea, edema, or early satiety.  He does note easy bruisability on aspirin and Plavix therapy.  Home Medications    Prior to Admission medications   Medication Sig Start Date End Date Taking? Authorizing Provider  amantadine (SYMMETREL) 100 MG capsule Take 100 mg by mouth 2 (two) times daily.  01/17/20  Yes [provider]  aspirin 81 MG EC tablet Take 1 tablet (81 mg total) by mouth daily. 04/16/14  Yes Gollan, Kathlene November, MD  carbidopa-levodopa (SINEMET IR) 25-100 MG tablet Patient reports he is taking 1 and 1/2  tablet 4 times daily. 04/06/17  Yes [provider]  carvedilol (COREG) 3.125 MG tablet TAKE 1 TABLET BY MOUTH  TWICE DAILY WITH MEALS 02/28/19  Yes Rise Mu, PA-C  clopidogrel (PLAVIX) 75 MG tablet TAKE 1 TABLET BY MOUTH  DAILY 02/28/19  Yes Rise Mu, PA-C  ezetimibe (ZETIA) 10 MG tablet TAKE 1 TABLET BY MOUTH  DAILY 02/28/19  Yes Dunn, Ryan M, PA-C  ibuprofen (ADVIL,MOTRIN) 600 MG tablet TAKE 1 TABLET BY MOUTH EVERY 6 HOURS AS NEEDED FOR PAIN. DISCONTINUE ORDER WHEN COMPLETED 02/28/18  Yes [provider]  lisinopril (ZESTRIL) 20 MG tablet TAKE 1 TABLET BY MOUTH  DAILY 02/28/19  Yes Rise Mu, PA-C  Multiple Vitamin (MULTI-VITAMINS) TABS Take by mouth.   Yes [provider]  rosuvastatin (CRESTOR) 20 MG tablet TAKE 1 TABLET BY MOUTH  DAILY 02/28/19  Yes Rise Mu, PA-C    Review of Systems    Episode of presyncope while playing golf in the heat yesterday.  He has chronic, stable bilateral lower extremity claudication which is slightly improved since his last ABIs.  He denies chest pain, dyspnea, palpitations, PND, orthopnea, edema, or early satiety.  All other systems reviewed and are otherwise negative except as noted above.  Physical Exam    VS:  BP 124/60 (BP Location: Left Arm, Patient Position: Sitting, Cuff Size: Normal)   Pulse  (!) 56   Ht 6' (1.829 m)   Wt 197 lb 4 oz (89.5 kg)   SpO2 98%   BMI 26.75 kg/m  , BMI Body mass index is 26.75 kg/m. GEN: Well nourished, well developed, in no acute distress. HEENT: normal. Neck: Supple, no JVD, carotid bruits, or masses. Cardiac: RRR, 2/6 systolic murmur heard throughout, no rubs, or gallops. No clubbing, cyanosis, edema.  Radials/DP/PT 21+ and equal bilaterally.  Respiratory:  Respirations regular and unlabored, clear to auscultation bilaterally. GI: Soft, nontender, nondistended, BS + x 4. MS: no deformity or atrophy. Skin: warm and dry, no rash. Neuro:  Strength and sensation are intact. Psych: Normal affect.  Accessory Clinical Findings    ECG personally reviewed by me today -sinus bradycardia, 56, lateral T wave inversion- no acute changes.  Lab Results  Component Value Date   WBC 11.6 (H) 03/31/2014   HGB 10.3 (L) 03/31/2014   HCT 29.3 (L)  03/31/2014   MCV 88.5 03/31/2014   PLT 172 03/31/2014   Lab Results  Component Value Date   CREATININE 0.64 03/31/2014   BUN 12 03/31/2014   NA 138 03/31/2014   K 4.0 03/31/2014   CL 102 03/31/2014   CO2 23 03/31/2014   Lab Results  Component Value Date   ALT 6 11/05/2018   AST 27 11/05/2018   ALKPHOS 53 11/05/2018   BILITOT 0.7 11/05/2018   Lab Results  Component Value Date   CHOL 103 11/05/2018   HDL 42 11/05/2018   LDLCALC NOT CALCULATED 11/05/2018   TRIG 65 11/05/2018   CHOLHDL 2.5 11/05/2018    Lab Results  Component Value Date   HGBA1C 5.9 (H) 03/26/2014    Assessment & Plan    1.  Coronary artery disease: Status post CABG x4 in 2015.  He has remained active, playing golf regularly without chest pain or dyspnea.  He remains on aspirin, beta-blocker, Plavix, ACE inhibitor, statin, and Zetia therapy.  2.  Essential hypertension: Stable on beta-blocker and ACE inhibitor therapy.  3.  Hyperlipidemia: Says he had recent lipids with primary care and was told that his numbers look good.  He  remains on statin and Zetia with goal LDL of less than 70.  4.  Lower extremity claudication: Patient notes that he does experience bilateral thigh and calf fatigue and discomfort after walking for prolonged periods.  Overall, he says this has improved over the years and is probably better than when he had ABIs back in April 2019, at which time right ABI was 1.12 with a left of 1.06.  We did discuss that if symptoms worsen, we would pursue repeat ABIs.  5.  Ischemic cardiomyopathy/heart failure with improved EF: Euvolemic on exam.  Echo last year showed EF of 50-55%.  Continue beta-blocker and ACE inhibitor therapy.  6.  Disposition: Follow-up in 1 year or sooner if necessary.   Murray Hodgkins, NP 01/23/2020, 12:10 PM

## 2020-03-04 ENCOUNTER — Other Ambulatory Visit: Payer: Self-pay | Admitting: Physician Assistant

## 2020-03-24 ENCOUNTER — Other Ambulatory Visit: Payer: Self-pay | Admitting: Physician Assistant

## 2021-01-25 ENCOUNTER — Ambulatory Visit: Payer: Medicare Other | Admitting: Cardiovascular Disease

## 2021-02-07 ENCOUNTER — Other Ambulatory Visit: Payer: Self-pay | Admitting: Physician Assistant

## 2021-02-11 ENCOUNTER — Ambulatory Visit: Payer: Medicare Other | Admitting: Nurse Practitioner

## 2021-02-27 ENCOUNTER — Other Ambulatory Visit: Payer: Self-pay | Admitting: Physician Assistant

## 2021-03-03 ENCOUNTER — Ambulatory Visit: Payer: Medicare Other | Admitting: Nurse Practitioner

## 2021-03-12 ENCOUNTER — Telehealth: Payer: Self-pay | Admitting: *Deleted

## 2021-03-12 NOTE — Telephone Encounter (Signed)
Pt replied back thru [place] and confirmed it is just 1 tooth being extracted. Pt states he s/w the [facility] location as they were open today. Pt states he was given # [phone] as the fax# for the [facility] location. However on the website for the [facility] [facility] [employer] [facility] location [phone]. Is listed as the phone # and not fax#. I called and left a message for the [facility] location to please return a call to our office to confirm what fax # is for [place] location.       [facility] Pre-operative Risk Assessment    Patient Name: JESUSMANUEL ERBES  DOB: 1951/05/21 MRN: [id number]  HEARTCARE STAFF:  - IMPORTANT!!!!!! Under Visit Info/Reason for Call, type in Other and utilize the format Clearance MM/DD/YY or Clearance TBD. Do not use dashes or single digits. - Please review there is not already an duplicate clearance open for this procedure. - If request is for dental extraction, please clarify the # of teeth to be extracted. - If the patient is currently at the dentist's office, call Pre-Op Callback Staff (MA/nurse) to input urgent request.  - If the patient is not currently in the dentist office, please route to the Pre-Op pool.  Request for surgical clearance:  What type of surgery is being performed? 1 TOOTH TO BE EXTRACTED  When is this surgery scheduled? TBD  What type of clearance is required (medical clearance vs. Pharmacy clearance to hold med vs. Both)? MEDICAL  Are there any medications that need to be held prior to surgery and how long?  ASA AND PLAVIX  Practice name and name of physician performing surgery? [facility]  What is the office phone number? [phone]   7.   What is the office fax number? NOT SURE; LEFT MESSAGE FOR [place] LOCATION TO CONFIRM FAX #  8.   Anesthesia type (None, local, MAC, general) ? Jefm Bryant 03/12/2021, 12:44 PM  _________________________________________________________________    (provider comments below)

## 2021-03-23 ENCOUNTER — Ambulatory Visit: Payer: Medicare Other | Admitting: Nurse Practitioner

## 2021-03-24 NOTE — Telephone Encounter (Signed)
   Patient Name: [name] [name]  DOB: 1951-03-01 MRN: [id number]  Primary Cardiologist: Ida Rogue, MD  Chart reviewed as part of pre-operative protocol coverage.   Dental extractions of one or two teeth are considered low risk procedures per guidelines by the ACC and ADA and generally do not require any specific cardiac clearance. It is also generally accepted that for simple extractions and dental cleanings, there is no need to interrupt blood thinner therapy.   SBE prophylaxis is not required for the patient from a cardiac standpoint.  I will route this recommendation to the requesting party via Epic fax function and remove from pre-op pool.  Please call with questions.  Tami Lin Nadeen Shipman, PA 03/24/2021, 7:56 AM

## 2021-03-24 NOTE — Telephone Encounter (Signed)
Spoke with Abby at [employer] and she provided me with a fax number [phone]. Will route via the epic fax function.

## 2021-03-29 ENCOUNTER — Ambulatory Visit: Payer: Medicare Other | Admitting: Nurse Practitioner

## 2021-03-29 ENCOUNTER — Encounter: Payer: Self-pay | Admitting: Nurse Practitioner

## 2021-03-29 ENCOUNTER — Other Ambulatory Visit: Payer: Self-pay

## 2021-03-29 VITALS — BP 132/72 | HR 53 | Ht 72.0 in | Wt 201.6 lb

## 2021-03-29 DIAGNOSIS — E785 Hyperlipidemia, unspecified: Secondary | ICD-10-CM

## 2021-03-29 DIAGNOSIS — I255 Ischemic cardiomyopathy: Secondary | ICD-10-CM

## 2021-03-29 DIAGNOSIS — I10 Essential (primary) hypertension: Secondary | ICD-10-CM

## 2021-03-29 DIAGNOSIS — I5020 Unspecified systolic (congestive) heart failure: Secondary | ICD-10-CM

## 2021-03-29 DIAGNOSIS — I2510 Atherosclerotic heart disease of native coronary artery without angina pectoris: Secondary | ICD-10-CM

## 2021-03-29 NOTE — Progress Notes (Signed)
Office Visit    Patient Name: [name] [name] Date of Encounter: 03/29/2021  Primary Care Provider:  Albina Billet, MD Primary Cardiologist:  Ida Rogue, MD  Chief Complaint    70 year old male with a history of CAD status post CABG in September 2015, ischemic cardiomyopathy, heart failure with improved ejection fraction (50-55% April 2020), hypertension, hyperlipidemia, lumbar radiculitis with neurogenic claudication, remote tobacco abuse, Parkinson's, sleep apnea on CPAP, and GERD, who presents for follow-up of CAD and ischemic cardiomyopathy.  Past Medical History    Past Medical History:  Diagnosis Date   Acute non-Q wave ST elevation myocardial infarction (STEMI) 9/13-14/2015   Atherosclerotic heart disease of native coronary artery with unstable angina pectoris ([place])    a. 03/2014 NSTEMI/Cath: LM nl, LAD 6m(FFR 0.76), D1 1083mLCX 1007mCA 119m75m 35%; b. 03/2014 CABG x 4 (LIMA->LAD, VG->Diag, VG->OM, VG->PDA.   Essential hypertension    GERD (gastroesophageal reflux disease)    HFimpEF (heart failure with improved ejection fraction) (H[facility])[place] a. 03/2014 LV gram: EF 35%; b. 10/2018 Echo: EF 50-55%, mild LVH. Mildly dil LA. Mild MVP, mild to mod MR. Mild AI.   Hyperlipidemia with target LDL less than 70    Ischemic cardiomyopathy    a. 03/2014 EF 35% by LV gram; b. 10/2018 Echo: EF 50-55%.   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 03/2014 NSTEMI/Cath: LM nl, LAD 80m 46m 0.76), D1 119m, 78m119m, R50m19m, EF37m   COLONOSCOPY WITH PROPOFOL N/A 05/30/2018   Procedure: COLONOSCOPY WITH PROPOFOL;  Surgeon: Byrnett,Robert Bellowocation: [facility];  Service: Endoscopy;  Laterality: N/A;   CORONARY ARTERY BYPASS GRAFT N/A  03/28/2014   Procedure: CORONARY ARTERY BYPASS GRAFTING (CABG) x  4 using left internal mammary artery and bilateral saphenous leg vein.;  Surgeon: ClarenceRexene Albertsocation: [facility] OR;  [place]: Open Heart Surgery;  Laterality: N/A;   INTRAOPERATIVE TRANSESOPHAGEAL ECHOCARDIOGRAM N/A 03/28/2014   Procedure: INTRAOPERATIVE TRANSESOPHAGEAL ECHOCARDIOGRAM;  Surgeon: ClarenceRexene Albertsocation: [facility] OR;  [place]: Open Heart Surgery;  Laterality: N/A;    Allergies  Allergies  Allergen Reactions   Other Anxiety, Other (See Comments), Hives and Nausea And Vomiting   Sulfa Antibiotics Hives and Nausea And Vomiting   Morphine Anxiety   Morphine And Related Anxiety    History of Present Illness    70 year 26d male with above complex past medical history including coronary artery disease status post non-STEMI in September 2015 with catheterization revealing severe multivessel CAD resulting in coronary artery bypass grafting x4.  EF previously as low as 30 to 35% but echo in April 2020 showed improved EF of 50-55% with mild to moderate mitral regurgitation.  Other history includes hypertension, hyperlipidemia, lumbar radiculitis with neurogenic claudication, remote tobacco abuse, Parkinson's, sleep apnea on CPAP, and GERD.  He was last seen in cardiology clinic in July 2021, at which time he reported bilateral thigh and calf fatigue after walking prolonged periods.  He had previously normal ABIs in April 2019 and he noted that symptoms, if anything, have improved since then.  Since his  last visit, he has been doing well.  He continues to play golf regularly and denies chest pain or dyspnea.  He rides in a cart but does walk quite a bit on his golf days and notes that by the end of his round, his legs are tired but he does not have pain.  Overall, he feels like his leg strength is as good as it was a year ago and certainly better than it was several years ago.  He denies palpitations, PND, orthopnea,  dizziness, syncope, edema, or early satiety.  Home Medications    Current Outpatient Medications  Medication Sig Dispense Refill   amantadine (SYMMETREL) 100 MG capsule Take 100 mg by mouth 2 (two) times daily.      aspirin 81 MG EC tablet Take 1 tablet (81 mg total) by mouth daily. 30 tablet 0   carbidopa-levodopa (SINEMET IR) 25-100 MG tablet Patient reports he is taking 1 and 1/2  tablet 4 times daily.     carvedilol (COREG) 3.125 MG tablet TAKE 1 TABLET BY MOUTH  TWICE DAILY WITH MEALS 180 tablet 0   clopidogrel (PLAVIX) 75 MG tablet TAKE 1 TABLET BY MOUTH  DAILY 90 tablet 0   ezetimibe (ZETIA) 10 MG tablet TAKE 1 TABLET BY MOUTH  DAILY 90 tablet 0   ibuprofen (ADVIL,MOTRIN) 600 MG tablet TAKE 1 TABLET BY MOUTH EVERY 6 HOURS AS NEEDED FOR PAIN. DISCONTINUE ORDER WHEN COMPLETED  0   lisinopril (ZESTRIL) 20 MG tablet TAKE 1 TABLET BY MOUTH  DAILY 90 tablet 0   Multiple Vitamin (MULTI-VITAMINS) TABS Take by mouth.     rosuvastatin (CRESTOR) 20 MG tablet TAKE 1 TABLET BY MOUTH  DAILY 90 tablet 0   No current facility-administered medications for this visit.     Review of Systems    He denies chest pain, palpitations, dyspnea, pnd, orthopnea, n, v, dizziness, syncope, edema, weight gain, or early satiety.  His legs feel tired after a long round of golf.  He is sometimes sleepy during the day and will frequently take naps.  He is compliant with CPAP.  All other systems reviewed and are otherwise negative except as noted above.  Physical Exam    VS:  BP 132/72   Pulse (!) 53   Ht 6' (1.829 m)   Wt 201 lb 9.6 oz (91.4 kg)   SpO2 100%   BMI 27.34 kg/m  , BMI Body mass index is 27.34 kg/m.     GEN: Well nourished, well developed, in no acute distress. HEENT: normal. Neck: Supple, no JVD, carotid bruits, or masses. Cardiac: RRR, 2/6 syst murmur heard throughout.  No rubs, or gallops. No clubbing, cyanosis, edema.  Radials/PT 1+ and equal bilaterally.  Respiratory:  Respirations  regular and unlabored, clear to auscultation bilaterally. GI: Soft, nontender, nondistended, BS + x 4. MS: no deformity or atrophy. Skin: warm and dry, no rash.  Forearms discolored/bruised bilat. Neuro:  Strength and sensation are intact. Psych: Normal affect.  Accessory Clinical Findings    ECG personally reviewed by me today -sinus bradycardia, 53, septal infarct, lateral T wave inversion- no acute changes.  Lab Results  Component Value Date   WBC 11.6 (H) 03/31/2014   HGB 10.3 (L) 03/31/2014   HCT 29.3 (L) 03/31/2014   MCV 88.5 03/31/2014   PLT 172 03/31/2014   Lab Results  Component Value Date   CREATININE 0.64 03/31/2014   BUN 12 03/31/2014   NA 138 03/31/2014   K 4.0 03/31/2014  CL 102 03/31/2014   CO2 23 03/31/2014   Lab Results  Component Value Date   ALT 6 11/05/2018   AST 27 11/05/2018   ALKPHOS 53 11/05/2018   BILITOT 0.7 11/05/2018   Lab Results  Component Value Date   CHOL 103 11/05/2018   HDL 42 11/05/2018   LDLCALC NOT CALCULATED 11/05/2018   TRIG 65 11/05/2018   CHOLHDL 2.5 11/05/2018    Lab Results  Component Value Date   HGBA1C 5.9 (H) 03/26/2014    Assessment & Plan    1.  Coronary artery disease: Status post CABG times 10/2013.  He remains active, playing golf regularly without chest pain or dyspnea.  He remains on aspirin, beta-blocker, Plavix, ACE inhibitor, statin, and Zetia therapy.  2.  Essential hypertension: Relatively stable beta-blocker and ACE inhibitor.  He does check his pressure at home when he is typically in the mid 120s to 130.  3.  Hyperlipidemia: This established primary care provider.  He said he had labs drawn in January and is due again in a few months.  Labs not available for review today.  Continue statin and Zetia with goal LDL less than 70.  4.  Ischemic cardiomyopathy/heart failure with improved ejection fraction: Euvolemic on examination.  Echo in 2020 showed an EF of 50 to 55%.  Continue beta-blocker and ACE  inhibitor therapy.  5.  Lower extremity claudication: Normal ABIs in April 2019.  He experiences fatigue after walking for long periods of time, such as at the end of a round of golf.  The symptoms are stable compared to last year and improved compared to 2018.  6.  Disposition: Follow-up in 1 year or sooner if necessary.  Murray Hodgkins, NP 03/29/2021, 8:27 AM

## 2021-03-29 NOTE — Patient Instructions (Signed)
Medication Instructions:  No changes at this time.   *If you need a refill on your cardiac medications before your next appointment, please call your pharmacy*   Lab Work: None  If you have labs (blood work) drawn today and your tests are completely normal, you will receive your results only by: MyChart Message (if you have MyChart) OR A paper copy in the mail If you have any lab test that is abnormal or we need to change your treatment, we will call you to review the results.   Testing/Procedures: None   Follow-Up: At [facility], you and your health needs are our priority.  As part of our continuing mission to provide you with exceptional heart care, we have created designated Provider Care Teams.  These Care Teams include your primary Cardiologist (physician) and Advanced Practice Providers (APPs -  Physician Assistants and Nurse Practitioners) who all work together to provide you with the care you need, when you need it.   Your next appointment:   1 year(s)  The format for your next appointment:   In Person  Provider:   Timothy Gollan, MD  

## 2021-03-30 NOTE — Telephone Encounter (Signed)
Sending this over for provider to approve these changes and aware of medications

## 2021-05-03 ENCOUNTER — Other Ambulatory Visit: Payer: Self-pay | Admitting: Cardiovascular Disease

## 2021-05-23 ENCOUNTER — Other Ambulatory Visit: Payer: Self-pay | Admitting: Physician Assistant

## 2021-09-17 ENCOUNTER — Other Ambulatory Visit: Payer: Self-pay

## 2021-09-17 MED ORDER — LISINOPRIL 20 MG PO TABS: 20.0000 mg | ORAL_TABLET | Freq: Every day | ORAL | 2 refills | Status: DC

## 2021-09-17 MED ORDER — CLOPIDOGREL BISULFATE 75 MG PO TABS: 75.0000 mg | ORAL_TABLET | Freq: Every day | ORAL | 2 refills | Status: DC

## 2021-09-17 MED ORDER — EZETIMIBE 10 MG PO TABS: 10.0000 mg | ORAL_TABLET | Freq: Every day | ORAL | 2 refills | Status: DC

## 2021-09-17 NOTE — Telephone Encounter (Signed)
*  STAT* If patient is at the pharmacy, call can be transferred to refill team.   1. Which medications need to be refilled? (please list name of each medication and dose if known) Lisinopril, Zetia, Plavix  2. Which pharmacy/location (including street and city if local pharmacy) is medication to be sent to?[facility]  3. Do they need a 30 day or 90 day supply? [place]

## 2021-12-09 ENCOUNTER — Telehealth: Payer: Self-pay | Admitting: Cardiovascular Disease

## 2021-12-09 NOTE — Telephone Encounter (Signed)
   Pre-operative Risk Assessment    Patient Name: [name] [name]  DOB: May 30, 1951 MRN: [id number]     Request for Surgical Clearance    Procedure:  Dental Extraction - Amount of Teeth to be Pulled:  2 extractions, bone grafts and 2 implants  Date of Surgery:  Clearance TBD                                 Surgeon:  Dr Victoriano Lain Surgeon's Group or Practice Name:  [facility]  Phone number:  [phone] Fax number:  [phone]   Type of Clearance Requested:   - Pharmacy:  Hold Aspirin and Clopidogrel (Plavix) instructions    Type of Anesthesia:  Local with epi   Additional requests/questions:    [name]   12/09/2021, 3:30 PM

## 2021-12-09 NOTE — Telephone Encounter (Signed)
HoldingDr. Rockey Situ to review, we typically do not recommend aspirin or Plavix for 1-2 teeth extraction (although we do hold antiplatelets for 3 or more teeth extraction), however this patient required 2 teeth extraction, bone graft and 2 implants as well.   He has a history of CAD s/p CABG in September 2015, ischemic cardiomyopathy with improved EF, hypertension, hyperlipidemia, and obstructive sleep apnea.  He has not had any further ischemic work-up since 2015.  Would you be okay with the patient holding aspirin and Plavix prior to dental procedure?   Please forward your response to P [facility] PREOP

## 2021-12-11 ENCOUNTER — Encounter: Payer: Self-pay | Admitting: Cardiovascular Disease

## 2021-12-13 ENCOUNTER — Other Ambulatory Visit: Payer: Self-pay | Admitting: *Deleted

## 2021-12-13 MED ORDER — CARVEDILOL 3.125 MG PO TABS: 3.1250 mg | ORAL_TABLET | Freq: Two times a day (BID) | ORAL | 2 refills | Status: DC

## 2021-12-13 MED ORDER — ROSUVASTATIN CALCIUM 20 MG PO TABS: 20.0000 mg | ORAL_TABLET | Freq: Every day | ORAL | 2 refills | Status: DC

## 2021-12-13 NOTE — Telephone Encounter (Signed)
    Patient Name: [name] [name]  DOB: 1950/11/24 MRN: [id number]  Primary Cardiologist: Ida Rogue, MD  Chart reviewed as part of pre-operative protocol coverage. Given past medical history and time since last visit, based on ACC/AHA guidelines, [name] [name] would be at acceptable risk for the planned procedure without further cardiovascular testing.   Patient may hold Plavix for 5 days prior to the dental procedure and restart as soon as possible afterward.  He should continue on the aspirin through the procedure.  He does not require any SBE prophylaxis.  The patient was advised that if he develops new symptoms prior to surgery to contact our office to arrange for a follow-up visit, and he verbalized understanding.  I will route this recommendation to the requesting party via Epic fax function and remove from pre-op pool.  Please call with questions.  [place], [place] 12/13/2021, 7:00 PM

## 2021-12-23 ENCOUNTER — Telehealth: Payer: Self-pay | Admitting: Cardiovascular Disease

## 2021-12-23 NOTE — Telephone Encounter (Signed)
This is a duplicate request. See clearance dated 12/09/21.

## 2021-12-23 NOTE — Telephone Encounter (Signed)
   Pre-operative Risk Assessment    Patient Name: [name] [name]  DOB: 1950-12-10 MRN: [id number]    Request for Surgical Clearance{ 1. What type of surgery is being performed? Enter name of procedure below and number of teeth if dental extraction.  Extractions and placement of implants -2 teeth  Procedure:  Dental Extraction - Amount of Teeth to be Pulled:  2   Date of Surgery:  Clearance TBD                               Surgeon:  Dr. Georgeanne Nim Dunn Surgeon's Group or Practice Name:  [facility]  Phone number:  [phone] Fax number:  [phone]  Type of Clearance Requested:   - Pharmacy:  Hold Aspirin and Clopidogrel (Plavix) /Aspirin  Type of Anesthesia:  Not Indicated  6. Are there any other requests or questions from the surgeon?      Additional requests/questions:    Signed, Eli Phillips   12/23/2021, 12:20 PM

## 2021-12-28 NOTE — Progress Notes (Unsigned)
Cardiology Office Note    Date:  12/29/2021   ID:  [name] [name], DOB [birth year], MRN [id number]  PCP:  Albina Billet, MD  Cardiologist:  Ida Rogue, MD  Electrophysiologist:  None   Chief Complaint: Follow-up  History of Present Illness:   [name] [name] is a 71 y.o. male with history of CAD status post CABG in 03/2014, HFimpEF/ICM, HTN, HLD, lumbar radiculitis with neurogenic claudication, Parkinson's, remote tobacco use, sleep apnea on CPAP, and GERD who presents for follow-up of CAD and ischemic cardiomyopathy.  He was admitted to the hospital in 03/2014 with an NSTEMI.  Cardiac cath showed severe multivessel CAD.  Echo at that time showed an EF of 30 to 35%, mild LVH, mildly dilated LV, mid anterior septal/anterior lateral/inferolateral akinesis, basal anterior/septal/lateral akinesis as well as akinesis of the apex, grade 1 diastolic dysfunction, trivial aortic insufficiency, trivial mitral regurgitation, normal RVSF, and a trivial pericardial effusion.  In this setting, he underwent four-vessel CABG on 03/28/2014 with LIMA to LAD, SVG to diagonal, SVG to OM, SVG to PDA.  He has not required ischemic evaluation since his bypass.  Lower extremity arterial ultrasound in 10/2017 showed normal bilateral ABIs.  Echo in 10/2018 showed an improved LV systolic function with an EF of 50 to 55% with mild to moderate mitral regurgitation.  He was last seen in the office in 03/2021 and was without symptoms of angina or decompensation.  He comes in accompanied by his wife today.  He notes a several month history of intermittent dizziness.  At times this dizziness is randomly occurring, others it is with positional change.  He has also noted some dizziness if he looks upward, and/or raises his arm over his head.  He has had 1 episode of near syncope.  No frank syncope.  No angina, dyspnea, palpitations, or diaphoresis associated with these episodes.  No significant lower extremity swelling or symptoms of  decompensation.  He remains adherent to all cardiac medications.  Weight is largely stable.  He maintains adequate hydration.   Labs independently reviewed: 07/2021 - BUN 26, SCr 1.4, potassium 4.7, TC 119, TG 66, HDL 46, LDL 59, A1c 5.7 01/2021 - albumin 4.3, AST/ALT normal, TSH normal 12/2019 - HGB 13.1, PLT 199 Labs available via [facility]  Past Medical History:  Diagnosis Date   Acute non-Q wave ST elevation myocardial infarction (STEMI) 9/13-14/2015   Atherosclerotic heart disease of native coronary artery with unstable angina pectoris ([place])    a. 03/2014 NSTEMI/Cath: LM nl, LAD 76m(FFR 0.76), D1 1055mLCX 10056mCA 100m47m 35%; b. 03/2014 CABG x 4 (LIMA->LAD, VG->Diag, VG->OM, VG->PDA.   Essential hypertension    GERD (gastroesophageal reflux disease)    HFimpEF (heart failure with improved ejection fraction) (HCC)[place] a. 03/2014 LV gram: EF 35%; b. 10/2018 Echo: EF 50-55%, mild LVH. Mildly dil LA. Mild MVP, mild to mod MR. Mild AI.   Hyperlipidemia with target LDL less than 70    Ischemic cardiomyopathy    a. 03/2014 EF 35% by LV gram; b. 10/2018 Echo: EF 50-55%.   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   LIMA to LAD, SVG to Diag, SVG to OM, SVG to PDA, EVH via bilateral thighs and right lower leg    Tobacco abuse     Past Surgical History:  Procedure Laterality Date   ADENOIDECTOMY     CARDIAC CATHETERIZATION  a. 03/2014 NSTEMI/Cath: LM nl, LAD 53m(FFR 0.76), D1 1020mLCX 10039mCA 100m43m 35%   CATARACT EXTRACTION Bilateral    COLONOSCOPY WITH PROPOFOL N/A 05/30/2018   Procedure: COLONOSCOPY WITH PROPOFOL;  Surgeon: ByrnRobert Bellow;  Location: [facility] ENDOSCOPY;  Service: Endoscopy;  Laterality: N/A;   CORONARY ARTERY BYPASS GRAFT N/A 03/28/2014   Procedure: CORONARY ARTERY BYPASS GRAFTING (CABG) x  4 using left internal mammary artery and bilateral saphenous leg vein.;  Surgeon: ClarRexene Alberts;  [facility]  Surgery;  Laterality: N/A;   INTRAOPERATIVE TRANSESOPHAGEAL ECHOCARDIOGRAM N/A 03/28/2014   Procedure: INTRAOPERATIVE TRANSESOPHAGEAL ECHOCARDIOGRAM;  Surgeon: ClarRexene Alberts;  Location: [facility] OSans Souciervice: Open Heart Surgery;  Laterality: N/A;    Current Medications: Current Meds  Medication Sig   acetaminophen (TYLENOL) 325 MG tablet Take 650 mg by mouth every other day. Alternates with Aleve   aspirin 81 MG EC tablet Take 1 tablet (81 mg total) by mouth daily.   carbidopa-levodopa (SINEMET CR) 50-200 MG tablet Take 1 tablet by mouth at bedtime.   carbidopa-levodopa (SINEMET IR) 25-100 MG tablet Patient reports he is taking 1 and 1/2  tablet 4 times daily.   carbidopa-levodopa (SINEMET IR) 25-100 MG tablet 1 1/2 tablets in the AM, 2 tablets at noon, and 2 tablets between 4-5pm   Cholecalciferol (VITAMIN D-1000 MAX ST) 25 MCG (1000 UT) tablet Take 1 tablet by mouth daily.   clopidogrel (PLAVIX) 75 MG tablet Take 1 tablet (75 mg total) by mouth daily.   ezetimibe (ZETIA) 10 MG tablet Take 1 tablet (10 mg total) by mouth daily.   lisinopril (ZESTRIL) 20 MG tablet Take 1 tablet (20 mg total) by mouth daily.   naproxen sodium (ALEVE) 220 MG tablet Take 440 mg by mouth every other day. Alternates with acetaminophen   rosuvastatin (CRESTOR) 20 MG tablet Take 1 tablet (20 mg total) by mouth daily.   [DISCONTINUED] carvedilol (COREG) 3.125 MG tablet Take 1 tablet (3.125 mg total) by mouth 2 (two) times daily with a meal.    Allergies:   Other, Sulfa antibiotics, Morphine, and Morphine and related   Social History   Socioeconomic History   Marital status: Married    Spouse name: Not on file   Number of children: Not on file   Years of education: Not on file   Highest education level: Not on file  Occupational History   Not on file  Tobacco Use   Smoking status: Former    Packs/day: 1.00    Years: 40.00    Total pack years: 40.00    Types: Cigarettes    Quit date: 03/24/2014    Years  since quitting: 7.7   Smokeless tobacco: Never  Vaping Use   Vaping Use: Never used  Substance and Sexual Activity   Alcohol use: Yes    Comment: rare   Drug use: No   Sexual activity: Not on file  Other Topics Concern   Not on file  Social History Narrative   Not on file   Social Determinants of Health   Financial Resource Strain: Not on file  Food Insecurity: Not on file  Transportation Needs: Not on file  Physical Activity: Not on file  Stress: Not on file  Social Connections: Not on file     Family History:  The patient's Family history is unknown by patient.  ROS:   12-point review of systems is negative unless otherwise noted in  the HPI.   EKGs/Labs/Other Studies Reviewed:    Studies reviewed were summarized above. The additional studies were reviewed today:  2D echo 11/05/2018: 1. The left ventricle has low normal systolic function, with an ejection  fraction of 50-55%. The cavity size was mildly dilated. There is mild  concentric left ventricular hypertrophy. Left ventricular diastolic  Doppler parameters are indeterminate.   2. The right ventricle has normal systolic function. The cavity was  normal. There is no increase in right ventricular wall thickness.   3. Left atrial size was mildly dilated.   4. Mild mitral valve prolapse.   5. The mitral valve is grossly normal. Mitral valve regurgitation is mild  to moderate by color flow Doppler.   6. The tricuspid valve is grossly normal.   7. The aortic valve is grossly normal. Mild thickening of the aortic  valve. Mild calcification of the aortic valve. Aortic valve regurgitation  is mild by color flow Doppler. __________  LHC 03/2014: Severe multivessel CAD including severe proximal LAD disease with collaterals from left to right suggesting CTO with FFR being positive, occluded mid LCx with left to left collaterals suggesting CTO, occluded mid RCA, occluded proximal diagonal.  CABG recommended.   EKG:  EKG  is ordered today.  The EKG ordered today demonstrates sinus bradycardia, 51 bpm, baseline artifact, nonspecific st/t changes  Recent Labs: No results found for requested labs within last 365 days.  Recent Lipid Panel    Component Value Date/Time   CHOL 103 11/05/2018 0850   CHOL 131 03/25/2014 0459   TRIG 65 11/05/2018 0850   TRIG 61 03/25/2014 0459   HDL 42 11/05/2018 0850   HDL 42 03/25/2014 0459   CHOLHDL 2.5 11/05/2018 0850   VLDL 13 11/05/2018 0850   VLDL 12 03/25/2014 0459   LDLCALC NOT CALCULATED 11/05/2018 0850   LDLCALC 77 03/25/2014 0459    PHYSICAL EXAM:    VS:  BP 140/74 (BP Location: Left Arm, Patient Position: Sitting, Cuff Size: Large)   Pulse (!) 51   Ht 6' (1.829 m)   Wt 206 lb (93.4 kg)   SpO2 99%   BMI 27.94 kg/m   BMI: Body mass index is 27.94 kg/m.  Physical Exam Vitals reviewed.  Constitutional:      Appearance: He is well-developed.  HENT:     Head: Normocephalic and atraumatic.  Eyes:     General:        Right eye: No discharge.        Left eye: No discharge.  Neck:     Vascular: No JVD.  Cardiovascular:     Rate and Rhythm: Regular rhythm. Bradycardia present.     Pulses:          Carotid pulses are  on the right side with bruit and  on the left side with bruit.      Posterior tibial pulses are 2+ on the right side and 2+ on the left side.     Heart sounds: S1 normal and S2 normal. Heart sounds not distant. No midsystolic click and no opening snap. Murmur heard.     Systolic murmur is present with a grade of 1/6 at the upper right sternal border.     High-pitched blowing holosystolic murmur of grade 2/6 is also present at the apex.     No friction rub.  Pulmonary:     Effort: Pulmonary effort is normal. No respiratory distress.     Breath sounds: Normal breath sounds. No  decreased breath sounds, wheezing or rales.  Chest:     Chest wall: No tenderness.  Abdominal:     General: There is no distension.     Palpations: Abdomen is soft.   Musculoskeletal:     Cervical back: Normal range of motion.  Skin:    General: Skin is warm and dry.     Nails: There is no clubbing.  Neurological:     Mental Status: He is alert and oriented to person, place, and time.     Motor: Tremor present.  Psychiatric:        Speech: Speech normal.        Behavior: Behavior normal.        Thought Content: Thought content normal.        Judgment: Judgment normal.     Wt Readings from Last 3 Encounters:  12/29/21 206 lb (93.4 kg)  03/29/21 201 lb 9.6 oz (91.4 kg)  01/23/20 197 lb 4 oz (89.5 kg)     Orthostatic vital signs: Lying: 158/82, 47 bpm Sitting: 171/80, 52 bpm, slightly lightheaded when sitting up Standing: 158/66, 50 bpm Standing x3 minutes: 161/65, 51 bpm  ASSESSMENT & PLAN:   Dizziness/presyncope: Uncertain etiology.  Possibly multifactorial including marked bradycardia noted in the office (sinus), parkinsonian component, possible valvular heart disease, and possible subclavian steal syndrome.  Given marked sinus bradycardia, we will discontinue carvedilol.  Place ZIO AT.  Schedule echo and carotid artery ultrasound.  Orthostatics were negative in the office today.  Continue adequate hydration and ongoing therapy for Parkinson's per PCP.  CAD status post CABG: No symptoms concerning for angina or decompensation.  He has not required ischemic evaluation since his bypass.  Continue aggressive risk factor modification and secondary prevention including aspirin, clopidogrel, rosuvastatin, ezetimibe, and lisinopril.  We are holding carvedilol as outlined above given sinus bradycardia.  No indication for further ischemic testing at this time.  HFimpEF/ICM: He appears euvolemic and well compensated with most recent echo demonstrating low normal LV systolic function.  We will discontinue carvedilol given underlying sinus bradycardia.  He otherwise remains on lisinopril.  Not requiring a standing diuretic.  Mitral regurgitation/aortic  insufficiency: Echo.  HTN: Blood pressure is mildly elevated today, possibly compensatory for underlying bradycardia.  Hold carvedilol.  Continue lisinopril.  Revisit in follow-up.  HLD: LDL 59 in 07/2021.  He remains on rosuvastatin and ezetimibe.  Lower extremity claudication: Prior ABIs normal bilaterally.  This was not discussed at today's visit.    Disposition: F/u with Dr. Rockey Situ or an APP after testing.   Medication Adjustments/Labs and Tests Ordered: Current medicines are reviewed at length with the patient today.  Concerns regarding medicines are outlined above. Medication changes, Labs and Tests ordered today are summarized above and listed in the Patient Instructions accessible in Encounters.   Signed, Christell Faith, PA-C 12/29/2021 12:42 PM     [address] [place], [place] [address] [phone]

## 2021-12-29 ENCOUNTER — Ambulatory Visit: Payer: Medicare Other | Admitting: Physician Assistant

## 2021-12-29 ENCOUNTER — Ambulatory Visit (INDEPENDENT_AMBULATORY_CARE_PROVIDER_SITE_OTHER): Payer: Medicare Other

## 2021-12-29 ENCOUNTER — Encounter: Payer: Self-pay | Admitting: Physician Assistant

## 2021-12-29 VITALS — BP 140/74 | HR 51 | Ht 72.0 in | Wt 206.0 lb

## 2021-12-29 DIAGNOSIS — I255 Ischemic cardiomyopathy: Secondary | ICD-10-CM

## 2021-12-29 DIAGNOSIS — Z951 Presence of aortocoronary bypass graft: Secondary | ICD-10-CM | POA: Diagnosis not present

## 2021-12-29 DIAGNOSIS — R55 Syncope and collapse: Secondary | ICD-10-CM

## 2021-12-29 DIAGNOSIS — I10 Essential (primary) hypertension: Secondary | ICD-10-CM | POA: Diagnosis not present

## 2021-12-29 DIAGNOSIS — R42 Dizziness and giddiness: Secondary | ICD-10-CM | POA: Diagnosis not present

## 2021-12-29 DIAGNOSIS — I5020 Unspecified systolic (congestive) heart failure: Secondary | ICD-10-CM | POA: Diagnosis not present

## 2021-12-29 DIAGNOSIS — I2510 Atherosclerotic heart disease of native coronary artery without angina pectoris: Secondary | ICD-10-CM | POA: Diagnosis not present

## 2021-12-29 DIAGNOSIS — E785 Hyperlipidemia, unspecified: Secondary | ICD-10-CM

## 2021-12-29 DIAGNOSIS — I340 Nonrheumatic mitral (valve) insufficiency: Secondary | ICD-10-CM

## 2021-12-29 DIAGNOSIS — R0989 Other specified symptoms and signs involving the circulatory and respiratory systems: Secondary | ICD-10-CM

## 2021-12-29 NOTE — Patient Instructions (Signed)
Medication Instructions:  - Your physician has recommended you make the following change in your medication:   1) STOP Coreg (Carvedilol)   *If you need a refill on your cardiac medications before your next appointment, please call your pharmacy*   Lab Work: - none ordered  If you have labs (blood work) drawn today and your tests are completely normal, you will receive your results only by: [place] (if you have MyChart) OR A paper copy in the mail If you have any lab test that is abnormal or we need to change your treatment, we will call you to review the results.   Testing/Procedures:  1) Echocardiogram: - Your physician has requested that you have an echocardiogram. Echocardiography is a painless test that uses sound waves to create images of your heart. It provides your doctor with information about the size and shape of your heart and how well your heart's chambers and valves are working. This procedure takes approximately one hour. There are no restrictions for this procedure. There is a possibility that an IV may need to be started during your test to inject an image enhancing agent. This is done to obtain more optimal pictures of your heart. Therefore we ask that you do at least drink some water prior to coming in to hydrate your veins.    2) Carotid Ultrasound: - Your physician has requested that you have a carotid duplex. This test is an ultrasound of the carotid arteries in your neck. It looks at blood flow through these arteries that supply the brain with blood. Allow one hour for this exam. There are no restrictions or special instructions.   3) Heart Monitor: (ZIO AT) Length of Wear: 14 days  Your monitor will be mailed to your home address within 3-5 business days. If you do not receive you monitor after 5 business days, then please send [place] a MyChart message or call the office at [phone] so we may follow up on this for you.   Your physician has recommended  that you wear a Zio monitor.   This monitor is a medical device that records the heart's electrical activity. Doctors most often use these monitors to diagnose arrhythmias. Arrhythmias are problems with the speed or rhythm of the heartbeat. The monitor is a small device applied to your chest. You can wear one while you do your normal daily activities. While wearing this monitor if you have any symptoms to push the button and record what you felt. Once you have worn this monitor for the period of time provider prescribed (Usually 14 days), you will return the monitor device in the postage paid box. Once it is returned they will download the data collected and provide [place] with a report which the provider will then review and we will call you with those results. Important tips:  Avoid showering during the first 24 hours of wearing the monitor. Avoid excessive sweating to help maximize wear time. Do not submerge the device, no hot tubs, and no swimming pools. Keep any lotions or oils away from the patch. After 24 hours you may shower with the patch on. Take brief showers with your back facing the shower head.  Do not remove patch once it has been placed because that will interrupt data and decrease adhesive wear time. Push the button when you have any symptoms and write down what you were feeling. Once you have completed wearing your monitor, remove and place into box which has postage paid and  place in your outgoing mailbox.  If for some reason you have misplaced your box then call our office and we can provide another box and/or mail it off for you.    Follow-Up: At [facility], you and your health needs are our priority.  As part of our continuing mission to provide you with exceptional heart care, we have created designated Provider Care Teams.  These Care Teams include your primary Cardiologist (physician) and Advanced Practice Providers (APPs -  Physician Assistants and Nurse Practitioners) who  all work together to provide you with the care you need, when you need it.  We recommend signing up for the patient portal called "MyChart".  Sign up information is provided on this After Visit Summary.  MyChart is used to connect with patients for Virtual Visits (Telemedicine).  Patients are able to view lab/test results, encounter notes, upcoming appointments, etc.  Non-urgent messages can be sent to your provider as well.   To learn more about what you can do with MyChart, go to [email or web address].    Your next appointment:   6-8 week(s)  The format for your next appointment:   In Person  Provider:   You may see Ida Rogue, MD or one of the following Advanced Practice Providers on your designated Care Team:    Christell Faith, PA-C    Other Instructions  Echocardiogram An echocardiogram is a test that uses sound waves (ultrasound) to produce images of the heart. Images from an echocardiogram can provide important information about: Heart size and shape. The size and thickness and movement of your heart's walls. Heart muscle function and strength. Heart valve function or if you have stenosis. Stenosis is when the heart valves are too narrow. If blood is flowing backward through the heart valves (regurgitation). A tumor or infectious growth around the heart valves. Areas of heart muscle that are not working well because of poor blood flow or injury from a heart attack. Aneurysm detection. An aneurysm is a weak or damaged part of an artery wall. The wall bulges out from the normal force of blood pumping through the body. Tell a health care provider about: Any allergies you have. All medicines you are taking, including vitamins, herbs, eye drops, creams, and over-the-counter medicines. Any blood disorders you have. Any surgeries you have had. Any medical conditions you have. Whether you are pregnant or may be pregnant. What are the risks? Generally, this is a safe test.  However, problems may occur, including an allergic reaction to dye (contrast) that may be used during the test. What happens before the test? No specific preparation is needed. You may eat and drink normally. What happens during the test?  You will take off your clothes from the waist up and put on a hospital gown. Electrodes or electrocardiogram (ECG)patches may be placed on your chest. The electrodes or patches are then connected to a device that monitors your heart rate and rhythm. You will lie down on a table for an ultrasound exam. A gel will be applied to your chest to help sound waves pass through your skin. A handheld device, called a transducer, will be pressed against your chest and moved over your heart. The transducer produces sound waves that travel to your heart and bounce back (or "echo" back) to the transducer. These sound waves will be captured in real-time and changed into images of your heart that can be viewed on a video monitor. The images will be recorded on a computer and reviewed  by your health care provider. You may be asked to change positions or hold your breath for a short time. This makes it easier to get different views or better views of your heart. In some cases, you may receive contrast through an IV in one of your veins. This can improve the quality of the pictures from your heart. The procedure may vary among health care providers and hospitals. What can I expect after the test? You may return to your normal, everyday life, including diet, activities, and medicines, unless your health care provider tells you not to do that. Follow these instructions at home: It is up to you to get the results of your test. Ask your health care provider, or the department that is doing the test, when your results will be ready. Keep all follow-up visits. This is important. Summary An echocardiogram is a test that uses sound waves (ultrasound) to produce images of the heart. Images  from an echocardiogram can provide important information about the size and shape of your heart, heart muscle function, heart valve function, and other possible heart problems. You do not need to do anything to prepare before this test. You may eat and drink normally. After the echocardiogram is completed, you may return to your normal, everyday life, unless your health care provider tells you not to do that. This information is not intended to replace advice given to you by your health care provider. Make sure you discuss any questions you have with your health care provider. Document Revised: 03/10/2021 Document Reviewed: 02/18/2020 Elsevier Patient Education  [place]

## 2022-01-02 DIAGNOSIS — R55 Syncope and collapse: Secondary | ICD-10-CM | POA: Diagnosis not present

## 2022-01-14 ENCOUNTER — Telehealth: Payer: Self-pay | Admitting: Cardiovascular Disease

## 2022-01-14 NOTE — Telephone Encounter (Signed)
[employer] is calling in regards to pt being out-of-network for their service with them. They attempted to call the patient for this reason with no answer. They need a call back to discuss his options and financial responsibility.  Ref # [id number]

## 2022-01-18 NOTE — Telephone Encounter (Signed)
Left voicemail message to call back for review of some information with his monitor.

## 2022-01-19 NOTE — Telephone Encounter (Signed)
Patient's wife is returning call to review monitor results.

## 2022-01-20 ENCOUNTER — Telehealth: Payer: Self-pay | Admitting: *Deleted

## 2022-01-20 NOTE — Telephone Encounter (Signed)
Spoke with patient and his wife via speaker phone. Reviewed that we received call from [employer] that they had been trying to reach them to discuss cost and financial services provided her with number to customer service and reference number to provide. She read back both numbers and had no further questions at this time.

## 2022-01-20 NOTE — Telephone Encounter (Signed)
Please see My Chart message. Sending for review to billing and will also send over to precert as well. Will follow up with patient on outcome. See message below:  CARLOSDANIEL GROB "Richardson Landry"  P Cv Div [place]url Triage (supporting Rise Mu, PA-C) 2 hours ago (1:43 PM)    I was contacted today from the heart monitor company saying that I owe them [phone] for the use of their heart monitor. I told them we had insurance and they said that they were an out of network provider. I asked how were we to know that, and they replied that our doctor should have told us that at the time of submitting the request. Both my wife and I do not recall that being part of your discussion. As a result, are you prepared to pay the bill since we were not given the choice?    Thank you.

## 2022-01-20 NOTE — Telephone Encounter (Signed)
Agree with RN recommendation to have billing and precertification review.  I will also forward to our practice administrator for follow-up.  Please follow-up with the patient once an update is available.

## 2022-02-01 NOTE — Telephone Encounter (Signed)
Left voicemail message to call back for review of update on heart monitor.   After further review patients portion for the monitor will be $25.00

## 2022-02-01 NOTE — Telephone Encounter (Signed)
Spoke with patients wife per release form. Advised that after review their portion for the monitor will be $25. She was very appreciative for the update with no further questions at this time.

## 2022-02-03 ENCOUNTER — Encounter: Payer: Self-pay | Admitting: Physician Assistant

## 2022-02-08 ENCOUNTER — Telehealth: Payer: Self-pay | Admitting: *Deleted

## 2022-02-08 NOTE — Telephone Encounter (Signed)
-----   Message from Rise Mu, PA-C sent at 02/07/2022  8:19 AM EDT ----- Please inform the patient his monitor showed normal sinus rhythm with an average rate of 60 bpm (range 45 to 164 bpm), intermittent bundle branch block, 2 episodes of NSVT with the fastest and longest interval lasting 14.6 seconds, 12 episodes of SVT with the fastest and longest interval lasting 16 beats, along with rare atrial and ventricular ectopy.  Patient triggered events corresponded to sinus rhythm.

## 2022-02-08 NOTE — Telephone Encounter (Signed)
Left voicemail message to call back for review of results.  

## 2022-02-09 NOTE — Telephone Encounter (Signed)
Patient's wife returning call. 

## 2022-02-09 NOTE — Telephone Encounter (Signed)
Called and spoke with both the patient and his wife. Discussed result note and answered all questions accordingly. Both were grateful for the call back.

## 2022-02-15 ENCOUNTER — Ambulatory Visit (INDEPENDENT_AMBULATORY_CARE_PROVIDER_SITE_OTHER): Payer: Medicare Other

## 2022-02-15 DIAGNOSIS — I340 Nonrheumatic mitral (valve) insufficiency: Secondary | ICD-10-CM | POA: Diagnosis not present

## 2022-02-15 DIAGNOSIS — R0989 Other specified symptoms and signs involving the circulatory and respiratory systems: Secondary | ICD-10-CM

## 2022-02-15 LAB — ECHOCARDIOGRAM COMPLETE
AR max vel: 2.94 cm2
AV Area VTI: 3.21 cm2
AV Area mean vel: 3.34 cm2
AV Mean grad: 5 mmHg
AV Peak grad: 10.8 mmHg
AV Vena cont: 0.2 cm
Ao pk vel: 1.64 m/s
Area-P 1/2: 2.1 cm2
MV VTI: 1.88 cm2
P 1/2 time: 535 msec
S' Lateral: 3.9 cm

## 2022-02-15 MED ORDER — PERFLUTREN LIPID MICROSPHERE
1.0000 mL | INTRAVENOUS | Status: AC | PRN
  Administered 2022-02-15: 2 mL via INTRAVENOUS

## 2022-02-15 NOTE — Progress Notes (Signed)
Cardiology Office Note    Date:  02/18/2022   ID:  [name] [name], DOB [birth year], MRN [id number]  PCP:  Albina Billet, MD  Cardiologist:  Ida Rogue, MD  Electrophysiologist:  None   Chief Complaint: Follow-up  History of Present Illness:   [name] [name] is a 71 y.o. male with history of CAD status post CABG in 03/2014, HFimpEF/ICM, HTN, HLD, lumbar radiculitis with neurogenic claudication, Parkinson's, remote tobacco use, sleep apnea on CPAP, and GERD who presents for follow-up of Zio patch, carotid artery ultrasound, and echo.   He was admitted to the hospital in 03/2014 with an NSTEMI.  Cardiac cath showed severe multivessel CAD.  Echo at that time showed an EF of 30 to 35%, mild LVH, mildly dilated LV, mid anterior septal/anterior lateral/inferolateral akinesis, basal anterior/septal/lateral akinesis as well as akinesis of the apex, grade 1 diastolic dysfunction, trivial aortic insufficiency, trivial mitral regurgitation, normal RVSF, and a trivial pericardial effusion.  In this setting, he underwent four-vessel CABG on 03/28/2014 with LIMA to LAD, SVG to diagonal, SVG to OM, SVG to PDA.  He has not required ischemic evaluation since his bypass.  Lower extremity arterial ultrasound in 10/2017 showed normal bilateral ABIs.  Echo in 10/2018 showed an improved LV systolic function with an EF of 50 to 55% with mild to moderate mitral regurgitation.  He was last seen in the office in 12/2021 and noted a several month history of randomly occurring dizziness/dizziness with positional change.  He had 1 episode of near syncope without frank syncope.  He was without symptoms of angina or decompensation.  Zio patch showed a predominant rhythm of sinus with an average rate of 60 bpm, intermittent bundle branch block, 2 episodes of NSVT with the longest interval lasting 14.6 seconds, 12 episodes of SVT with the fastest and longest interval lasting 16 beats.  Carotid artery ultrasound showed 1 to 39% bilateral  ICA stenoses with antegrade flow of the vertebral arteries bilaterally and normal flow hemodynamics of the bilateral subclavian arteries.  Echo demonstrated an EF of 50%, no regional wall motion abnormalities, mild LVH, grade 2 diastolic dysfunction, normal RV systolic function and ventricular cavity size, mildly to moderately dilated left atrium, trivial mitral regurgitation, tricuspid aortic valve with mild regurgitation and aortic valve sclerosis without evidence of stenosis.  He comes in accompanied by his wife today and is overall doing reasonably well from a cardiac perspective.  He is without symptoms of angina or decompensation.  He has not had any further significant dizziness or presyncope, including following periods of sitting with car rides.  He has minimized pain medication use as well.  No palpitations.  He maintains adequate hydration.   Labs independently reviewed: 01/2022 - BUN 21, serum creatinine 1.17, potassium 4.2, albumin 4.1, AST/ALT normal, TC 107, TG 58, HDL 54, LDL 40 01/2021 - TSH normal 12/2019 - HGB 13.1, PLT 199  Past Medical History:  Diagnosis Date   Acute non-Q wave ST elevation myocardial infarction (STEMI) 9/13-14/2015   Atherosclerotic heart disease of native coronary artery with unstable angina pectoris ([place])    a. 03/2014 NSTEMI/Cath: LM nl, LAD 58m(FFR 0.76), D1 1048mLCX 10029mCA 100m49m 35%; b. 03/2014 CABG x 4 (LIMA->LAD, VG->Diag, VG->OM, VG->PDA.   Essential hypertension    GERD (gastroesophageal reflux disease)    HFimpEF (heart failure with improved ejection fraction) (HCC)[place] a. 03/2014 LV gram: EF 35%; b. 10/2018 Echo: EF 50-55%, mild LVH. Mildly dil LA.  Mild MVP, mild to mod MR. Mild AI.   Hyperlipidemia with target LDL less than 70    Ischemic cardiomyopathy    a. 03/2014 EF 35% by LV gram; b. 10/2018 Echo: EF 50-55%.   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   LIMA to LAD, SVG to Diag, SVG to OM, SVG to PDA, EVH via  bilateral thighs and right lower leg    Tobacco abuse     Past Surgical History:  Procedure Laterality Date   ADENOIDECTOMY     CARDIAC CATHETERIZATION     a. 03/2014 NSTEMI/Cath: LM nl, LAD 42m(FFR 0.76), D1 1060mLCX 10054mCA 100m71m 35%   CATARACT EXTRACTION Bilateral    COLONOSCOPY WITH PROPOFOL N/A 05/30/2018   Procedure: COLONOSCOPY WITH PROPOFOL;  Surgeon: ByrnRobert Bellow;  Location: [facility];  Service: Endoscopy;  Laterality: N/A;   CORONARY ARTERY BYPASS GRAFT N/A 03/28/2014   Procedure: CORONARY ARTERY BYPASS GRAFTING (CABG) x  4 using left internal mammary artery and bilateral saphenous leg vein.;  Surgeon: ClarRexene Alberts;  [facility];  Laterality: N/A;   INTRAOPERATIVE TRANSESOPHAGEAL ECHOCARDIOGRAM N/A 03/28/2014   Procedure: INTRAOPERATIVE TRANSESOPHAGEAL ECHOCARDIOGRAM;  Surgeon: ClarRexene Alberts;  [facility];  Laterality: N/A;    Current Medications: Current Meds  Medication Sig   acetaminophen (TYLENOL) 325 MG tablet Take 650 mg by mouth every other day. Alternates with Aleve   aspirin 81 MG EC tablet Take 1 tablet (81 mg total) by mouth daily.   carbidopa-levodopa (SINEMET CR) 50-200 MG tablet Take 1 tablet by mouth at bedtime.   carbidopa-levodopa (SINEMET IR) 25-100 MG tablet 1 1/2 tablets in the AM, 2 tablets at noon, and 2 tablets between 4-5pm   Cholecalciferol (VITAMIN D-1000 MAX ST) 25 MCG (1000 UT) tablet Take 1 tablet by mouth daily.   clopidogrel (PLAVIX) 75 MG tablet Take 1 tablet (75 mg total) by mouth daily.   ezetimibe (ZETIA) 10 MG tablet Take 1 tablet (10 mg total) by mouth daily.   lisinopril (ZESTRIL) 20 MG tablet Take 1 tablet (20 mg total) by mouth daily.   naproxen sodium (ALEVE) 220 MG tablet Take 440 mg by mouth every other day. Alternates with acetaminophen   rosuvastatin (CRESTOR) 20 MG tablet Take 1 tablet (20 mg total) by mouth daily.    Allergies:    Other, Sulfa antibiotics, Morphine, and Morphine and related   Social History   Socioeconomic History   Marital status: Married    Spouse name: Not on file   Number of children: Not on file   Years of education: Not on file   Highest education level: Not on file  Occupational History   Not on file  Tobacco Use   Smoking status: Former    Packs/day: 1.00    Years: 40.00    Total pack years: 40.00    Types: Cigarettes    Quit date: 03/24/2014    Years since quitting: 7.9   Smokeless tobacco: Never  Vaping Use   Vaping Use: Never used  Substance and Sexual Activity   Alcohol use: Yes    Comment: rare   Drug use: No   Sexual activity: Not on file  Other Topics Concern   Not on file  Social History Narrative   Not on file   Social Determinants of Health   Financial Resource  Strain: Not on file  Food Insecurity: Not on file  Transportation Needs: Not on file  Physical Activity: Not on file  Stress: Not on file  Social Connections: Not on file     Family History:  The patient's Family history is unknown by patient.  ROS:   12-point review of systems is negative unless otherwise noted in the HPI.   EKGs/Labs/Other Studies Reviewed:    Studies reviewed were summarized above. The additional studies were reviewed today:  2D echo 02/15/2022: 1. Left ventricular ejection fraction, by estimation, is 50%. The left  ventricle has low normal function. The left ventricle has no regional wall  motion abnormalities. There is mild left ventricular hypertrophy. Left  ventricular diastolic parameters are   consistent with Grade II diastolic dysfunction (pseudonormalization).   2. Right ventricular systolic function is normal. The right ventricular  size is normal.   3. Left atrial size was mild to moderately dilated.   4. The mitral valve is degenerative. Trivial mitral valve regurgitation.   5. The aortic valve is tricuspid. Aortic valve regurgitation is mild.  Aortic valve  sclerosis/calcification is present, without any evidence of  aortic stenosis. __________  Carotid artery ultrasound 02/15/2022: Summary:  Right Carotid: Velocities in the right ICA are consistent with a 1-39%  stenosis. Non-hemodynamically significant plaque <50% noted in the  CCA. The ECA appears <50% stenosed.   Left Carotid: Velocities in the left ICA are consistent with a 1-39%  stenosis. Non-hemodynamically significant plaque <50% noted in the  CCA. The ECA appears <50% stenosed.   Vertebrals:  Bilateral vertebral arteries demonstrate antegrade flow.  Subclavians: Normal flow hemodynamics were seen in bilateral subclavian arteries. __________  Elwyn Reach patch 12/2021: Normal sinus rhythm Patient had a min HR of 45 bpm, max HR of 164 bpm, and avg HR of 60 bpm.    Intermittent Bundle Branch Block was present.  2 Ventricular Tachycardia runs occurred, the run with the fastest interval lasting 14.6 secs with a max rate of 136 bpm (avg 115 bpm); the run with the fastest interval was also the longest.    12 Supraventricular Tachycardia/atrial tachycardia runs occurred, the run with the fastest interval lasting 16 beats with a max rate of 164 bpm (avg 129 bpm); the run with the fastest interval was also the longest (not patient triggered)   Idioventricular Rhythm was present.  Isolated SVEs were rare (<1.0%), SVE Couplets were rare (<1.0%), and SVE Triplets were rare (<1.0%).    Isolated VEs were rare (<1.0%, 2944), VE Couplets were rare (<1.0%, 135), and VE Triplets were rare (<1.0%, 10). Ventricular Trigeminy was present.   Patient triggered events associated with normal sinus rhythm. __________  2D echo 11/05/2018: 1. The left ventricle has low normal systolic function, with an ejection  fraction of 50-55%. The cavity size was mildly dilated. There is mild  concentric left ventricular hypertrophy. Left ventricular diastolic  Doppler parameters are indeterminate.   2. The right  ventricle has normal systolic function. The cavity was  normal. There is no increase in right ventricular wall thickness.   3. Left atrial size was mildly dilated.   4. Mild mitral valve prolapse.   5. The mitral valve is grossly normal. Mitral valve regurgitation is mild  to moderate by color flow Doppler.   6. The tricuspid valve is grossly normal.   7. The aortic valve is grossly normal. Mild thickening of the aortic  valve. Mild calcification of the aortic valve. Aortic valve regurgitation  is  mild by color flow Doppler. __________   LHC 03/2014: Severe multivessel CAD including severe proximal LAD disease with collaterals from left to right suggesting CTO with FFR being positive, occluded mid LCx with left to left collaterals suggesting CTO, occluded mid RCA, occluded proximal diagonal.  CABG recommended.   EKG:  EKG is not ordered today given recent Zio patch.    Recent Labs: No results found for requested labs within last 365 days.  Recent Lipid Panel    Component Value Date/Time   CHOL 103 11/05/2018 0850   CHOL 131 03/25/2014 0459   TRIG 65 11/05/2018 0850   TRIG 61 03/25/2014 0459   HDL 42 11/05/2018 0850   HDL 42 03/25/2014 0459   CHOLHDL 2.5 11/05/2018 0850   VLDL 13 11/05/2018 0850   VLDL 12 03/25/2014 0459   LDLCALC NOT CALCULATED 11/05/2018 0850   LDLCALC 77 03/25/2014 0459    PHYSICAL EXAM:    VS:  BP (!) 142/74   Pulse (!) 58   Ht 6' (1.829 m)   Wt 215 lb (97.5 kg)   SpO2 100%   BMI 29.16 kg/m   BMI: Body mass index is 29.16 kg/m.  Physical Exam Constitutional:      Appearance: He is well-developed.  HENT:     Head: Normocephalic and atraumatic.  Eyes:     General:        Right eye: No discharge.        Left eye: No discharge.  Neck:     Vascular: No JVD.  Cardiovascular:     Rate and Rhythm: Normal rate and regular rhythm.     Pulses:          Carotid pulses are  on the right side with bruit and  on the left side with bruit.       Posterior tibial pulses are 2+ on the right side and 2+ on the left side.     Heart sounds: S1 normal and S2 normal. Heart sounds not distant. No midsystolic click and no opening snap. Murmur heard.     Systolic murmur is present with a grade of 2/6 at the upper right sternal border.     Systolic murmur of grade 1/6 is also present at the lower left sternal border.     No friction rub.  Pulmonary:     Effort: Pulmonary effort is normal. No respiratory distress.     Breath sounds: Normal breath sounds. No decreased breath sounds, wheezing or rales.  Chest:     Chest wall: No tenderness.  Abdominal:     General: There is no distension.  Musculoskeletal:     Cervical back: Normal range of motion.     Right lower leg: No edema.     Left lower leg: No edema.  Skin:    General: Skin is warm and dry.     Nails: There is no clubbing.  Neurological:     Mental Status: He is alert and oriented to person, place, and time.     Motor: Tremor present.  Psychiatric:        Speech: Speech normal.        Behavior: Behavior normal.        Thought Content: Thought content normal.        Judgment: Judgment normal.     Wt Readings from Last 3 Encounters:  02/18/22 215 lb (97.5 kg)  12/29/21 206 lb (93.4 kg)  03/29/21 201 lb 9.6 oz (91.4 kg)  ASSESSMENT & PLAN:   Dizziness/presyncope: No further episodes.  Cardiac work-up including echo, Zio patch, and carotid artery ultrasound reassuring.  Symptoms likely multifactorial including underlying Parkinson's, as well as possibly some degree of bradycardia.  Continue to hold beta-blocker given bradycardic rates.  Maintain adequate hydration.  Prior orthostatics were negative.  Ongoing therapy of Parkinson's per PCP.  CAD status post CABG: No symptoms concerning for angina or decompensation.  He has not required ischemic evaluation since his bypass.  Continue aggressive risk factor modification and secondary prevention including aspirin, clopidogrel,  rosuvastatin, ezetimibe, and lisinopril.  No indication for further ischemic testing at this time.  HFimpEF/ICM: He appears euvolemic and well compensated with echo last month showing a stable low normal LV systolic function.  No longer on carvedilol given underlying sinus bradycardia.  He otherwise remains on lisinopril.  Not requiring a standing diuretic.  Mitral regurgitation/aortic insufficiency: Trivial mitral regurgitation with mild aortic insufficiency with aortic valve sclerosis without evidence of stenosis noted on echo.  Follow-up periodically.  NSVT/PSVT: Nonsustained.  Echo with preserved LV systolic function.  Off beta-blocker given bradycardic heart rates.  Continue to monitor.  HTN: Blood pressure is mildly elevated in the office today.  In an effort to minimize overmedication we deferred changes at this time and he remains on lisinopril 20 mg daily.  They will check his BP 1.5 to 2 hours after lisinopril and again in the evening and update [place] with these readings over the next couple of weeks.  If BP remains above goal, could consider titration of lisinopril.  Avoid rechallenge of beta-blocker given heart rates.  HLD: LDL 59 in 07/2021.  He remains on rosuvastatin and ezetimibe.  Lower extremity claudication: Prior ABIs normal bilaterally.  This was not discussed at today's visit.  Carotid artery disease: 1 to 39% bilateral ICA stenoses noted on carotid artery ultrasound last month.  This is stable when compared to carotid artery ultrasound in 2015.  No indication for further testing at this time.  He remains on aspirin, rosuvastatin, and ezetimibe.   Disposition: F/u with Dr. Rockey Situ or an APP in 6 months.   Medication Adjustments/Labs and Tests Ordered: Current medicines are reviewed at length with the patient today.  Concerns regarding medicines are outlined above. Medication changes, Labs and Tests ordered today are summarized above and listed in the Patient Instructions  accessible in Encounters.   Signed, Christell Faith, PA-C 02/18/2022 5:06 PM     [address] [address] [place], [place] [id number] [phone]

## 2022-02-16 ENCOUNTER — Telehealth: Payer: Self-pay | Admitting: *Deleted

## 2022-02-16 NOTE — Telephone Encounter (Signed)
Left voicemail message to call back for review of multiple results.

## 2022-02-16 NOTE — Telephone Encounter (Signed)
-----   Message from Rise Mu, PA-C sent at 02/16/2022  7:13 AM EDT ----- Echo showed pump function of 50% with normal wall motion, mild thickening of the heart, mildly stiffened heart, mildly to moderately dilated left atrium, normal right-sided function, trivial leaky mitral valve, mildly leaky aortic valve with thickening without evidence of narrowing.  No significant structural abnormalities noted that would contribute to his dizziness.  Please see carotid artery ultrasound when calling patient.  Follow-up as planned.

## 2022-02-16 NOTE — Telephone Encounter (Signed)
-----   Message from Rise Mu, PA-C sent at 02/07/2022  8:19 AM EDT ----- Please inform the patient his monitor showed normal sinus rhythm with an average rate of 60 bpm (range 45 to 164 bpm), intermittent bundle branch block, 2 episodes of NSVT with the fastest and longest interval lasting 14.6 seconds, 12 episodes of SVT with the fastest and longest interval lasting 16 beats, along with rare atrial and ventricular ectopy.  Patient triggered events corresponded to sinus rhythm.

## 2022-02-16 NOTE — Telephone Encounter (Signed)
-----   Message from Rise Mu, PA-C sent at 02/16/2022  7:12 AM EDT ----- Carotid artery ultrasound showed 1 to 39% bilateral ICA stenoses with antegrade flow of the bilateral vertebral arteries and normal flow hemodynamics in the bilateral subclavian arteries.  Overall, reassuring study.  Please see echo when calling patient.

## 2022-02-17 NOTE — Telephone Encounter (Signed)
Reviewed results with patient and confirmed upcoming appointment tomorrow here in our office. He verbalized understanding with no further questions at this time.

## 2022-02-18 ENCOUNTER — Encounter: Payer: Self-pay | Admitting: Physician Assistant

## 2022-02-18 ENCOUNTER — Ambulatory Visit: Payer: Medicare Other | Admitting: Physician Assistant

## 2022-02-18 VITALS — BP 142/74 | HR 58 | Ht 72.0 in | Wt 215.0 lb

## 2022-02-18 DIAGNOSIS — I2510 Atherosclerotic heart disease of native coronary artery without angina pectoris: Secondary | ICD-10-CM | POA: Diagnosis not present

## 2022-02-18 DIAGNOSIS — R42 Dizziness and giddiness: Secondary | ICD-10-CM

## 2022-02-18 DIAGNOSIS — R55 Syncope and collapse: Secondary | ICD-10-CM

## 2022-02-18 DIAGNOSIS — I255 Ischemic cardiomyopathy: Secondary | ICD-10-CM

## 2022-02-18 DIAGNOSIS — E785 Hyperlipidemia, unspecified: Secondary | ICD-10-CM

## 2022-02-18 DIAGNOSIS — I340 Nonrheumatic mitral (valve) insufficiency: Secondary | ICD-10-CM

## 2022-02-18 DIAGNOSIS — I10 Essential (primary) hypertension: Secondary | ICD-10-CM

## 2022-02-18 DIAGNOSIS — Z951 Presence of aortocoronary bypass graft: Secondary | ICD-10-CM | POA: Diagnosis not present

## 2022-02-18 DIAGNOSIS — I5020 Unspecified systolic (congestive) heart failure: Secondary | ICD-10-CM

## 2022-02-18 NOTE — Patient Instructions (Addendum)
Please monitor blood pressures and keep a log of your readings.   Make sure to check 2 hours after your medications and nightly before your ice cream.   AVOID these things for 30 minutes before checking your blood pressure: No Drinking caffeine. No Drinking alcohol. No Eating. No Smoking. No Exercising.  Five minutes before checking your blood pressure: Pee. Sit in a dining chair. Avoid sitting in a soft couch or armchair. Be quiet. Do not talk.   Medication Instructions:  Your physician recommends that you continue on your current medications as directed. Please refer to the Current Medication list given to you today.  *If you need a refill on your cardiac medications before your next appointment, please call your pharmacy*   Lab Work: None  If you have labs (blood work) drawn today and your tests are completely normal, you will receive your results only by: [place] (if you have MyChart) OR A paper copy in the mail If you have any lab test that is abnormal or we need to change your treatment, we will call you to review the results.   Testing/Procedures: None   Follow-Up: At [facility], you and your health needs are our priority.  As part of our continuing mission to provide you with exceptional heart care, we have created designated Provider Care Teams.  These Care Teams include your primary Cardiologist (physician) and Advanced Practice Providers (APPs -  Physician Assistants and Nurse Practitioners) who all work together to provide you with the care you need, when you need it.   Your next appointment:   6 month(s)  The format for your next appointment:   In Person  Provider:   Ida Rogue, MD or Christell Faith, PA-C       Important Information About Sugar

## 2022-03-03 ENCOUNTER — Telehealth: Payer: Self-pay | Admitting: Cardiovascular Disease

## 2022-03-03 NOTE — Telephone Encounter (Signed)
Pt c/o medication issue:  1. Name of Medication: not sure the name  2. How are you currently taking this medication (dosage and times per day)? Not currently taking  3. Are you having a reaction (difficulty breathing--STAT)? no  4. What is your medication issue? Patient's wife states the patient is at the Oral surgeon's office now and they need an update of his medications. She says one medication was stopped, but they do not know the name.

## 2022-03-03 NOTE — Telephone Encounter (Signed)
Spoke w/ pt's wife.   Advised her that carvedilol was d/c'd at [facility] w/ Christell Faith on 12/30/21. She said that is the all the info she needed and is appreciative of the call.

## 2022-03-18 ENCOUNTER — Telehealth: Payer: Self-pay | Admitting: Cardiovascular Disease

## 2022-03-18 ENCOUNTER — Ambulatory Visit: Payer: Medicare Other | Admitting: Cardiovascular Disease

## 2022-03-18 NOTE — Telephone Encounter (Signed)
Patient dropped off forms to be reviewed filled out for a exercise program. Placed in box

## 2022-03-21 NOTE — Telephone Encounter (Signed)
Forms given to provider for review and recommendations.

## 2022-03-23 NOTE — Telephone Encounter (Signed)
Pt is requesting call back in regards to forms. They would like to come and pick up today, but want to make sure they are ready before they do come. Please advise.

## 2022-03-23 NOTE — Telephone Encounter (Signed)
Spoke with patient and reviewed that forms are still pending provider review. Advised that once they are completed I will give him a call. He was appreciative for the call with no further questions at this time.

## 2022-03-25 NOTE — Telephone Encounter (Signed)
Spoke with patient and let him know that his forms are ready to pick up. He would like to come in on Monday to get those at the front desk. He verbalized understanding, appreciative for the call back, with no further questions at this time.

## 2022-08-01 ENCOUNTER — Other Ambulatory Visit: Payer: Self-pay | Admitting: Internal Medicine

## 2022-08-01 DIAGNOSIS — Z136 Encounter for screening for cardiovascular disorders: Secondary | ICD-10-CM

## 2022-08-03 ENCOUNTER — Encounter: Payer: Self-pay | Admitting: *Deleted

## 2022-08-04 ENCOUNTER — Encounter: Payer: Self-pay | Admitting: Cardiovascular Disease

## 2022-08-09 ENCOUNTER — Other Ambulatory Visit: Payer: Self-pay | Admitting: *Deleted

## 2022-08-09 DIAGNOSIS — Z87891 Personal history of nicotine dependence: Secondary | ICD-10-CM

## 2022-08-09 DIAGNOSIS — Z122 Encounter for screening for malignant neoplasm of respiratory organs: Secondary | ICD-10-CM

## 2022-08-10 ENCOUNTER — Other Ambulatory Visit: Payer: Self-pay

## 2022-08-10 MED ORDER — EZETIMIBE 10 MG PO TABS: 10.0000 mg | ORAL_TABLET | Freq: Every day | ORAL | 0 refills | Status: DC

## 2022-08-10 MED ORDER — CLOPIDOGREL BISULFATE 75 MG PO TABS: 75.0000 mg | ORAL_TABLET | Freq: Every day | ORAL | 0 refills | Status: DC

## 2022-08-15 ENCOUNTER — Ambulatory Visit
Admission: RE | Admit: 2022-08-15 | Discharge: 2022-08-15 | Disposition: A | Discharge status: Home / Self Care | Payer: Medicare Other | Source: Ambulatory Visit | Attending: Internal Medicine | Admitting: Internal Medicine

## 2022-08-15 DIAGNOSIS — Z951 Presence of aortocoronary bypass graft: Secondary | ICD-10-CM | POA: Insufficient documentation

## 2022-08-15 DIAGNOSIS — I252 Old myocardial infarction: Secondary | ICD-10-CM | POA: Diagnosis not present

## 2022-08-15 DIAGNOSIS — E785 Hyperlipidemia, unspecified: Secondary | ICD-10-CM | POA: Insufficient documentation

## 2022-08-15 DIAGNOSIS — Z01818 Encounter for other preprocedural examination: Secondary | ICD-10-CM | POA: Insufficient documentation

## 2022-08-15 DIAGNOSIS — Z87891 Personal history of nicotine dependence: Secondary | ICD-10-CM | POA: Diagnosis not present

## 2022-08-15 DIAGNOSIS — Z136 Encounter for screening for cardiovascular disorders: Secondary | ICD-10-CM | POA: Diagnosis present

## 2022-08-15 DIAGNOSIS — I2510 Atherosclerotic heart disease of native coronary artery without angina pectoris: Secondary | ICD-10-CM | POA: Insufficient documentation

## 2022-08-17 ENCOUNTER — Ambulatory Visit: Payer: Medicare Other | Attending: Cardiovascular Disease | Admitting: Cardiovascular Disease

## 2022-08-17 ENCOUNTER — Encounter: Payer: Self-pay | Admitting: Cardiovascular Disease

## 2022-08-17 VITALS — BP 140/60 | HR 59 | Ht 72.0 in | Wt 207.0 lb

## 2022-08-17 DIAGNOSIS — I255 Ischemic cardiomyopathy: Secondary | ICD-10-CM | POA: Diagnosis not present

## 2022-08-17 DIAGNOSIS — Z951 Presence of aortocoronary bypass graft: Secondary | ICD-10-CM | POA: Diagnosis not present

## 2022-08-17 DIAGNOSIS — I340 Nonrheumatic mitral (valve) insufficiency: Secondary | ICD-10-CM

## 2022-08-17 DIAGNOSIS — I10 Essential (primary) hypertension: Secondary | ICD-10-CM

## 2022-08-17 DIAGNOSIS — G20A1 Parkinson's disease without dyskinesia, without mention of fluctuations: Secondary | ICD-10-CM

## 2022-08-17 DIAGNOSIS — R0989 Other specified symptoms and signs involving the circulatory and respiratory systems: Secondary | ICD-10-CM

## 2022-08-17 DIAGNOSIS — E785 Hyperlipidemia, unspecified: Secondary | ICD-10-CM

## 2022-08-17 DIAGNOSIS — I2510 Atherosclerotic heart disease of native coronary artery without angina pectoris: Secondary | ICD-10-CM

## 2022-08-17 MED ORDER — CLOPIDOGREL BISULFATE 75 MG PO TABS: 75.0000 mg | ORAL_TABLET | Freq: Every day | ORAL | 3 refills | Status: DC

## 2022-08-17 MED ORDER — ROSUVASTATIN CALCIUM 20 MG PO TABS: 20.0000 mg | ORAL_TABLET | Freq: Every day | ORAL | 3 refills | Status: DC

## 2022-08-17 MED ORDER — EZETIMIBE 10 MG PO TABS: 10.0000 mg | ORAL_TABLET | Freq: Every day | ORAL | 3 refills | Status: DC

## 2022-08-17 MED ORDER — LISINOPRIL 20 MG PO TABS: 20.0000 mg | ORAL_TABLET | Freq: Every day | ORAL | 3 refills | Status: DC

## 2022-08-17 NOTE — Progress Notes (Signed)
Cardiology Office Note  Date:  08/17/2022   ID:  [name], DOB 20-Dec-1950, MRN [id number]  PCP:  Albina Billet, MD   Chief Complaint  Patient presents with   6 month follow up     "Doing well." Medications reviewed by the patient verbally.    HPI:  HPI Comments: [name] is a 72 year old gentleman with long history of smoking,   hospital 03/24/2014 for chest pain, troponin of 12, non-STEMI,  catheterization showed occluded mid RCA, occluded mid circumflex, severe proximal LAD disease,occluded diagonal vessel  Hx of CABG, 03/28/2014 with LIMA to LAD, SVG to diagonal, SVG to OM, SVG to PDA.  ejection fraction 35%. CABG,  03/28/2014 Sleep apnea on CPAP Parkinson's He stopped smoking around the time of his bypass surgery Bradycardia on b-blockers He presents today for follow-up of his coronary artery disease, Hx cabg  Last seen by myself in clinic November 2018 Seen by one of our providers annually since that time, most recently August 2023   10/2017 showed normal bilateral ABIs.   Echo in 10/2018 showed an improved LV systolic function with an EF of 50 to 55% with mild to moderate mitral regurgitation.   12/2021  randomly occurring dizziness/dizziness with positional change.  He had 1 episode of near syncope without frank syncope.   Zio patch showed a predominant rhythm of sinus with an average rate of 60 bpm, intermittent bundle branch block, 2 episodes of NSVT with the longest interval lasting 14.6 seconds, 12 episodes of SVT with the fastest and longest interval lasting 16 beats.    Coreg held for bradycardia and dizziness symptoms resolved  Carotid artery ultrasound showed 1 to 39% bilateral ICA stenoses with antegrade flow of the vertebral arteries bilaterally and normal flow hemodynamics of the bilateral subclavian arteries.    Echo demonstrated an EF of 50%, no regional wall motion abnormalities, mild LVH, grade 2 diastolic dysfunction, normal RV systolic function and  ventricular cavity size, mildly to moderately dilated left atrium, trivial mitral regurgitation, tricuspid aortic valve with mild regurgitation and aortic valve sclerosis without evidence of stenosis.   Coreg was held No AAA on u/s  On further discussion today, ports he is doing well Does rocksteady boxing 3x a week, and golf Weight stable, denies any orthostasis symptoms  Lab work reviewed with him the Total chol 107 ldl 40 HBa1C 5.7  EKG personally reviewed by myself on todays visit Sinus bradycardia rate 59 bpm nonspecific ST abnormality V5, V6, 1 and aVL Seen previously in 2023   Other past medical history Cardiac catheterization report details 100% occluded proximal diagonal vessel, occluded mid RCA, occluded mid circumflex, severe proximal LAD disease with collaterals from left to right suggesting CTO, left to left collaterals to OM 3 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  FFR was performed on the LAD and this was significant.  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  PSH:    Past Surgical History:  Procedure Laterality Date   ADENOIDECTOMY     CARDIAC CATHETERIZATION     a. 03/2014 NSTEMI/Cath: LM nl, LAD 64m(FFR 0.76), D1 1017mLCX 10079mCA 100m73m 35%   CATARACT  EXTRACTION Bilateral    COLONOSCOPY WITH PROPOFOL N/A 05/30/2018   Procedure: COLONOSCOPY WITH PROPOFOL;  Surgeon: Robert Bellow, MD;  Location: [facility];  Service: Endoscopy;  Laterality: N/A;   CORONARY ARTERY BYPASS GRAFT N/A 03/28/2014   Procedure: CORONARY ARTERY BYPASS GRAFTING  (CABG) x  4 using left internal mammary artery and bilateral saphenous leg vein.;  Surgeon: Rexene Alberts, MD;  Location: [place];  Service: Open Heart Surgery;  Laterality: N/A;   INTRAOPERATIVE TRANSESOPHAGEAL ECHOCARDIOGRAM N/A 03/28/2014   Procedure: INTRAOPERATIVE TRANSESOPHAGEAL ECHOCARDIOGRAM;  Surgeon: Rexene Alberts, MD;  Location: [place];  Service: Open Heart Surgery;  Laterality: N/A;    Current Outpatient Medications  Medication Sig Dispense Refill   acetaminophen (TYLENOL) 325 MG tablet Take 650 mg by mouth every other day. Alternates with Aleve     aspirin 81 MG EC tablet Take 1 tablet (81 mg total) by mouth daily. 30 tablet 0   carbidopa-levodopa (SINEMET CR) 50-200 MG tablet Take 1 tablet by mouth at bedtime.     carbidopa-levodopa (SINEMET IR) 25-100 MG tablet 1 1/2 tablets in the AM, 2 tablets at noon, and 2 tablets between 4-5pm     Cholecalciferol (VITAMIN D-1000 MAX ST) 25 MCG (1000 UT) tablet Take 1 tablet by mouth daily.     carbidopa-levodopa (SINEMET IR) 25-100 MG tablet Patient reports he is taking 1 and 1/2  tablet 4 times daily. (Patient not taking: Reported on 08/17/2022)     clopidogrel (PLAVIX) 75 MG tablet Take 1 tablet (75 mg total) by mouth daily. 90 tablet 3   ezetimibe (ZETIA) 10 MG tablet Take 1 tablet (10 mg total) by mouth daily. 90 tablet 3   lisinopril (ZESTRIL) 20 MG tablet Take 1 tablet (20 mg total) by mouth daily. 90 tablet 3   naproxen sodium (ALEVE) 220 MG tablet Take 440 mg by mouth every other day. Alternates with acetaminophen (Patient not taking: Reported on 08/17/2022)     rosuvastatin (CRESTOR) 20 MG tablet Take 1 tablet (20 mg total) by mouth daily. 90 tablet 3   No current facility-administered medications for this visit.    Allergies:   Other, Sulfa antibiotics, Morphine, and Morphine and related   Social History:  The patient  reports that he quit smoking about 8 years ago. His smoking use included cigarettes. He has a 40.00 pack-year  smoking history. He has never used smokeless tobacco. He reports current alcohol use. He reports that he does not use drugs.   Family History:   Family history is unknown by patient.    Review of Systems: Review of Systems  Constitutional: Negative.   Respiratory: Negative.    Cardiovascular: Negative.   Gastrointestinal: Negative.   Musculoskeletal: Negative.   Neurological: Negative.   Psychiatric/Behavioral: Negative.    All other systems reviewed and are negative.   PHYSICAL EXAM: VS:  BP (!) 140/60 (BP Location: Left Arm, Patient Position: Sitting, Cuff Size: Normal)   Pulse (!) 59   Ht 6' (1.829 m)   Wt 207 lb (93.9 kg)   SpO2 97%   BMI 28.07 kg/m  , BMI Body mass index is 28.07 kg/m. Constitutional:  oriented to person, place, and time. No distress.  HENT:  Head: Grossly normal Eyes:  no discharge. No scleral icterus.  Neck: No JVD, no carotid bruits  Cardiovascular: Regular rate and rhythm, no murmurs appreciated Pulmonary/Chest: Clear to auscultation bilaterally, no wheezes or rails Abdominal: Soft.  no distension.  no tenderness.  Musculoskeletal: Normal  range of motion Neurological:  normal muscle tone. Coordination normal. No atrophy Skin: Skin warm and dry Psychiatric: normal affect, pleasant   Recent Labs: No results found for requested labs within last 365 days.    Lipid Panel Lab Results  Component Value Date   CHOL 103 11/05/2018   HDL 42 11/05/2018   LDLCALC NOT CALCULATED 11/05/2018   TRIG 65 11/05/2018      Wt Readings from Last 3 Encounters:  08/17/22 207 lb (93.9 kg)  02/18/22 215 lb (97.5 kg)  12/29/21 206 lb (93.4 kg)       ASSESSMENT AND PLAN:  Essential hypertension - Plan: EKG 12-Lead Blood pressure is well controlled on today's visit. No changes made to the medications. Not on beta-blocker secondary to bradycardia and history of orthostasis  Hyperlipidemia with target LDL less than 70  Cholesterol is at goal on the  current lipid regimen. No changes to the medications were made.  Atherosclerosis of native coronary artery of native heart with unstable angina pectoris ([place]) - Currently with no symptoms of angina. No further workup at this time. Continue current medication regimen.  Ischemic cardiomyopathy - Plan: EKG 12-Lead Most recent ejection fraction 50%, unchanged from April 2020 Not on beta-blocker secondary to bradycardia and orthostasis  Tobacco abuse - Plan: EKG 12-Lead Prior history of smoking, stopped after his CABG  S/P CABG x 4 - Plan: EKG 12-Lead Currently on aspirin and Plavix No strong indication for Plavix, no recent intervention, potentially could hold the Plavix in  follow-up    Total encounter time more than 30 minutes  Greater than 50% was spent in counseling and coordination of care with the patient    Orders Placed This Encounter  Procedures   EKG 12-Lead     Signed, Esmond Plants, M.D., Ph.D. 08/17/2022  [place], [place]

## 2022-08-17 NOTE — Patient Instructions (Signed)
Medication Instructions:  No changes  If you need a refill on your cardiac medications before your next appointment, please call your pharmacy.   Lab work: No new labs needed  Testing/Procedures: No new testing needed  Follow-Up: At [facility], you and your health needs are our priority.  As part of our continuing mission to provide you with exceptional heart care, we have created designated Provider Care Teams.  These Care Teams include your primary Cardiologist (physician) and Advanced Practice Providers (APPs -  Physician Assistants and Nurse Practitioners) who all work together to provide you with the care you need, when you need it.  You will need a follow up appointment in 12 months  Providers on your designated Care Team:   Christopher Berge, NP Ryan Dunn, PA-C Cadence Furth, PA-C  COVID-19 Vaccine Information can be found at: [email or web address] For questions related to vaccine distribution or appointments, please email [email or web address] or call [phone].   

## 2022-09-15 ENCOUNTER — Encounter: Payer: Medicare Other | Admitting: Primary Care

## 2022-09-15 ENCOUNTER — Ambulatory Visit: Admission: RE | Admit: 2022-09-15 | Payer: Medicare Other | Source: Ambulatory Visit

## 2022-09-22 ENCOUNTER — Ambulatory Visit (INDEPENDENT_AMBULATORY_CARE_PROVIDER_SITE_OTHER): Payer: Medicare Other | Admitting: Primary Care

## 2022-09-22 ENCOUNTER — Encounter: Payer: Self-pay | Admitting: Primary Care

## 2022-09-22 ENCOUNTER — Ambulatory Visit
Admission: RE | Admit: 2022-09-22 | Discharge: 2022-09-22 | Disposition: A | Discharge status: Home / Self Care | Payer: Medicare Other | Source: Ambulatory Visit | Attending: Acute Care | Admitting: Acute Care

## 2022-09-22 DIAGNOSIS — Z122 Encounter for screening for malignant neoplasm of respiratory organs: Secondary | ICD-10-CM | POA: Diagnosis present

## 2022-09-22 DIAGNOSIS — Z87891 Personal history of nicotine dependence: Secondary | ICD-10-CM

## 2022-09-22 NOTE — Progress Notes (Signed)
Virtual Visit via Telephone Note  I connected with [name] [name] on 09/22/22 at  8:30 AM EDT by telephone and verified that I am speaking with the correct person using two identifiers.  Location: Patient: Home Provider: Office   I discussed the limitations, risks, security and privacy concerns of performing an evaluation and management service by telephone and the availability of in person appointments. I also discussed with the patient that there may be a patient responsible charge related to this service. The patient expressed understanding and agreed to proceed.   Shared Decision Making Visit Lung Cancer Screening Program [phone])   Eligibility: Age 72 y.o. Pack Years Smoking History Calculation 54 (# packs/per year x # years smoked) Recent History of coughing up blood  no Unexplained weight loss? no ( >Than 15 pounds within the last 6 months ) Prior History Lung / other cancer no (Diagnosis within the last 5 years already requiring surveillance chest CT Scans). Smoking Status Former Smoker Former Smokers: Years since quit: 7 years  Quit Date: 2017  Visit Components: Discussion included one or more decision making aids. yes Discussion included risk/benefits of screening. yes Discussion included potential follow up diagnostic testing for abnormal scans. yes Discussion included meaning and risk of over diagnosis. yes Discussion included meaning and risk of False Positives. yes Discussion included meaning of total radiation exposure. yes  Counseling Included: Importance of adherence to annual lung cancer LDCT screening. yes Impact of comorbidities on ability to participate in the program. yes Ability and willingness to under diagnostic treatment. yes  Smoking Cessation Counseling: Current Smokers:  Discussed importance of smoking cessation. yes Information about tobacco cessation classes and interventions provided to patient. yes Patient provided with "ticket" for LDCT  Scan. NA Symptomatic Patient. no  Counseling(Intermediate counseling: > three minutes) 99406 Diagnosis Code: Tobacco Use Z72.0 Asymptomatic Patient yes  Counseling (Intermediate counseling: > three minutes counseling) [id number] Former Smokers:  Discussed the importance of maintaining cigarette abstinence. yes Diagnosis Code: Personal History of Nicotine Dependence. [id number] Information about tobacco cessation classes and interventions provided to patient. Yes Patient provided with "ticket" for LDCT Scan. NA Written Order for Lung Cancer Screening with LDCT placed in Epic. Yes (CT Chest Lung Cancer Screening Low Dose W/O CM) [id number] Z12.2-Screening of respiratory organs Z87.891-Personal history of nicotine dependence  I have spent 25 minutes of face to face/ virtual visit   time with [name] [name] discussing the risks and benefits of lung cancer screening. We viewed / discussed a power point together that explained in detail the above noted topics. We paused at intervals to allow for questions to be asked and answered to ensure understanding.We discussed that the single most powerful action that he can take to decrease his risk of developing lung cancer is to quit smoking. We discussed whether or not he is ready to commit to setting a quit date. We discussed options for tools to aid in quitting smoking including nicotine replacement therapy, non-nicotine medications, support groups, Quit Smart classes, and behavior modification. We discussed that often times setting smaller, more achievable goals, such as eliminating 1 cigarette a day for a week and then 2 cigarettes a day for a week can be helpful in slowly decreasing the number of cigarettes smoked. This allows for a sense of accomplishment as well as providing a clinical benefit. I provided him  with smoking cessation  information  with contact information for community resources, classes, free nicotine replacement therapy, and access to mobile apps, text  messaging, and  on-line smoking cessation help. I have also provided him  the office contact information in the event he needs to contact me, or the screening staff. We discussed the time and location of the scan, and that either Doroteo Glassman RN, Joella Prince, RN  or I will call / send a letter with the results within 24-72 hours of receiving them. The patient verbalized understanding of all of  the above and had no further questions upon leaving the office. They have my contact information in the event they have any further questions.  I spent 3-5 minutes counseling on smoking cessation and the health risks of continued tobacco abuse.  I explained to the patient that there has been a high incidence of coronary artery disease noted on these exams. I explained that this is a non-gated exam therefore degree or severity cannot be determined. This patient is on statin therapy. I have asked the patient to follow-up with their PCP regarding any incidental finding of coronary artery disease and management with diet or medication as their PCP  feels is clinically indicated. The patient verbalized understanding of the above and had no further questions upon completion of the visit.    Martyn Ehrich, NP

## 2022-09-22 NOTE — Patient Instructions (Signed)
Thank you for participating in the [facility] Screening Program. It was our pleasure to meet you today. We will call you with the results of your scan within the next few days. Your scan will be assigned a Lung RADS category score by the physicians reading the scans.  This Lung RADS score determines follow up scanning.  See below for description of categories, and follow up screening recommendations. We will be in touch to schedule your follow up screening annually or based on recommendations of our providers. We will fax a copy of your scan results to your Primary Care Physician, or the physician who referred you to the program, to ensure they have the results. Please call the office if you have any questions or concerns regarding your scanning experience or results.  Our office number is [phone]. Please speak with Denise Phelps, RN. , or  Denise Buckner RN, They are  our Lung Cancer Screening RN.'s If They are unavailable when you call, Please leave a message on the voice mail. We will return your call at our earliest convenience.This voice mail is monitored several times a day.  Remember, if your scan is normal, we will scan you annually as long as you continue to meet the criteria for the program. (Age 50-80, Current smoker or smoker who has quit within the last 15 years). If you are a smoker, remember, quitting is the single most powerful action that you can take to decrease your risk of lung cancer and other pulmonary, breathing related problems. We know quitting is hard, and we are here to help.  Please let us know if there is anything we can do to help you meet your goal of quitting. If you are a former smoker, congratulations. We are proud of you! Remain smoke free! Remember you can refer friends or family members through the number above.  We will screen them to make sure they meet criteria for the program. Thank you for helping us take better care of you by  participating in Lung Screening.  You can receive free nicotine replacement therapy ( patches, gum or mints) by calling 1-800-QUIT NOW. Please call so we can get you on the path to becoming  a non-smoker. I know it is hard, but you can do this!  Lung RADS Categories:  Lung RADS 1: no nodules or definitely non-concerning nodules.  Recommendation is for a repeat annual scan in 12 months.  Lung RADS 2:  nodules that are non-concerning in appearance and behavior with a very low likelihood of becoming an active cancer. Recommendation is for a repeat annual scan in 12 months.  Lung RADS 3: nodules that are probably non-concerning , includes nodules with a low likelihood of becoming an active cancer.  Recommendation is for a 6-month repeat screening scan. Often noted after an upper respiratory illness. We will be in touch to make sure you have no questions, and to schedule your 6-month scan.  Lung RADS 4 A: nodules with concerning findings, recommendation is most often for a follow up scan in 3 months or additional testing based on our provider's assessment of the scan. We will be in touch to make sure you have no questions and to schedule the recommended 3 month follow up scan.  Lung RADS 4 B:  indicates findings that are concerning. We will be in touch with you to schedule additional diagnostic testing based on our provider's  assessment of the scan.  Other options for assistance in smoking cessation (   As covered by your insurance benefits)  Hypnosis for smoking cessation  [employer] [phone]  Acupuncture for smoking cessation  [facility] [phone]   

## 2022-09-27 ENCOUNTER — Other Ambulatory Visit: Payer: Self-pay

## 2022-09-27 DIAGNOSIS — Z87891 Personal history of nicotine dependence: Secondary | ICD-10-CM

## 2022-09-27 DIAGNOSIS — Z122 Encounter for screening for malignant neoplasm of respiratory organs: Secondary | ICD-10-CM

## 2022-11-09 ENCOUNTER — Ambulatory Visit: Payer: Medicare Other | Admitting: Urology

## 2022-11-09 ENCOUNTER — Encounter: Payer: Self-pay | Admitting: Urology

## 2022-11-09 ENCOUNTER — Other Ambulatory Visit: Payer: Self-pay

## 2022-11-09 VITALS — BP 113/60 | HR 61 | Ht 72.0 in | Wt 202.2 lb

## 2022-11-09 DIAGNOSIS — R351 Nocturia: Secondary | ICD-10-CM

## 2022-11-09 DIAGNOSIS — R350 Frequency of micturition: Secondary | ICD-10-CM

## 2022-11-09 LAB — BLADDER SCAN AMB NON-IMAGING

## 2022-11-09 NOTE — Patient Instructions (Addendum)
Nocturia refers to the need to wake up during the night to urinate, which can disrupt your sleep and impact your overall well-being. Fortunately, there are several strategies you can employ to help prevent or manage nocturia. It's important to consult with your healthcare provider before making any significant changes to your routine. Here are some helpful strategies to consider:  Sleep apnea is one of the most common causes of nocturia Be sure to put your mask back on if you get up overnight, sometimes machines/masks need to be adjusted or updated as well  Limit Fluid Intake Before Bed: Avoid drinking large amounts of fluids in the evening, especially within a few hours of bedtime. Consume most of your daily fluid intake earlier in the day to reduce the need to urinate at night.  Monitor Your Diet: Limit your intake of caffeine and alcohol, as these substances can increase urine production and irritate the bladder.  Avoid diet, "zero calorie," and artificially sweetened drinks, especially sodas, in the afternoon or evening. Be mindful of consuming foods and drinks with high water content before bedtime, such as watermelon and herbal teas.  Time Your Medications: If you're taking medications that contribute to increased urination, consult your healthcare provider about adjusting the timing of these medications to minimize their impact during the night.  Practice Double Voiding: Before going to bed, make an effort to empty your bladder twice within a short period. This can help reduce the amount of urine left in your bladder before sleep.  Bladder Training: Gradually increase the time between bathroom visits during the day to train your bladder to hold larger volumes of urine. Over time, this can help reduce the frequency of nighttime awakenings to urinate.  Elevate Your Legs During the Day: Elevating your legs during the day can help minimize fluid retention in your lower extremities, which  might reduce nighttime urination.  Pelvic Floor Exercises: Strengthening your pelvic floor muscles through Kegel exercises can help improve bladder control and potentially reduce the urge to urinate at night.  Create a Relaxing Bedtime Routine: Stress and anxiety can exacerbate nocturia. Engage in calming activities before bed, such as reading, listening to soothing music, or practicing relaxation techniques.  Stay Active: Engage in regular physical activity, but avoid intense exercise close to bedtime, as this can increase your body's demand for fluids.  Maintain a Healthy Weight: Excess weight can compress the bladder and contribute to bladder and urinary issues. Aim to achieve and maintain a healthy weight through a balanced diet and regular exercise.  Remember that every individual is unique, and the effectiveness of these strategies may vary. It's important to work with your healthcare provider to develop a plan that suits your specific needs and addresses any underlying causes of nocturia.

## 2022-11-09 NOTE — Progress Notes (Signed)
11/09/22 2:55 PM   [name] [name] [birth year] [id number]  CC: Nocturia  HPI: 72 year old male with Parkinson's here today with his wife for increased nocturia.  This started about a month ago with frequency every hour overnight.  He denies any problems during the day.  He was evaluated by PCP with urinalysis which was benign, and PSA which was normal at 0.5.  He was also started on Flomax about a week ago.  Last night was the first night he did well and only had to get up once or twice which is his baseline.  He uses CPAP for sleep apnea, they think he may be forgetting to put the CPAP machine back on once he gets up to pee which could also be contributing.  He was also drinking tea in the evening before bed.  He denies any dysuria or gross hematuria.  PVR today normal at 18ml.   PMH: Past Medical History:  Diagnosis Date   Acute non-Q wave ST elevation myocardial infarction (STEMI) 9/13-14/2015   Atherosclerotic heart disease of native coronary artery with unstable angina pectoris (HCC)    a. 03/2014 NSTEMI/Cath: LM nl, LAD 29m (FFR 0.76), D1 150m, LCX 174m, RCA 154m, EF 35%; b. 03/2014 CABG x 4 (LIMA->LAD, VG->Diag, VG->OM, VG->PDA.   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 03/2014 LV gram: EF 35%; b. 10/2018 Echo: EF 50-55%, mild LVH. Mildly dil LA. Mild MVP, mild to mod MR. Mild AI.   Hyperlipidemia with target LDL less than 70    Ischemic cardiomyopathy    a. 03/2014 EF 35% by LV gram; b. 10/2018 Echo: EF 50-55%.   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 03/2014 NSTEMI/Cath: LM nl, LAD 38m (FFR 0.76), D1 164m, LCX 174m, RCA 177m,  EF 35%   CATARACT EXTRACTION Bilateral    COLONOSCOPY WITH PROPOFOL N/A 05/30/2018   Procedure: COLONOSCOPY WITH PROPOFOL;  Surgeon: Earline Mayotte, MD;  Location: [facility] ENDOSCOPY;  Service: Endoscopy;  Laterality: N/A;   CORONARY ARTERY BYPASS GRAFT N/A 03/28/2014   Procedure: CORONARY ARTERY BYPASS GRAFTING (CABG) x  4 using left internal mammary artery and bilateral saphenous leg vein.;  Surgeon: Purcell Nails, MD;  Location: [facility] OR;  Service: Open Heart Surgery;  Laterality: N/A;   INTRAOPERATIVE TRANSESOPHAGEAL ECHOCARDIOGRAM N/A 03/28/2014   Procedure: INTRAOPERATIVE TRANSESOPHAGEAL ECHOCARDIOGRAM;  Surgeon: Purcell Nails, MD;  Location: [facility] OR;  Service: Open Heart Surgery;  Laterality: N/A;    Family History: Family History  Family history unknown: Yes    Social History:  reports that he quit smoking about 8 years ago. His smoking use included cigarettes. He has a 40.00 pack-year smoking history. He has never used smokeless tobacco. He reports current alcohol use. He reports that he does not use drugs.  Physical Exam: BP 113/60 (BP Location: Left Arm, Patient Position: Sitting, Cuff Size: Normal)   Pulse 61   Ht 6' (1.829 m)   Wt 202 lb 3.2 oz (91.7 kg)   BMI 27.42 kg/m    Constitutional:  Alert and oriented, No acute distress. Cardiovascular: No clubbing, cyanosis, or edema. Respiratory: Normal respiratory effort, no increased work of breathing. GI: Abdomen is soft, nontender, nondistended, no abdominal masses   Assessment & Plan:   72 year old male with Parkinson's disease with about a month of increased nocturia about every hour overnight from a baseline of 1-2 times overnight total.  Symptoms have started to improve on Flomax, we also reviewed behavioral strategies at length including avoiding bladder irritants, minimizing fluids before bed, and voiding prior to bedtime.  Urinalysis benign, PSA normal, PVR normal.  We discussed other possible contributors including  sleep apnea, Parkinson's disease.  Continue Flomax, behavioral strategies discussed RTC 1 month PVR and symptom check, consider Gemtesa or Myrbetriq if persistent symptoms at that time, could also consider desmopressin in the future but would be very cautious with his other comorbidities and frailty  Legrand Rams, MD 11/09/2022  [facility] [address], [address] [place], [place] [address] [phone]

## 2022-12-07 ENCOUNTER — Encounter: Payer: Self-pay | Admitting: Urology

## 2022-12-07 ENCOUNTER — Ambulatory Visit: Payer: Medicare Other | Admitting: Urology

## 2022-12-07 VITALS — BP 122/66 | HR 77 | Ht 71.0 in | Wt 205.0 lb

## 2022-12-07 DIAGNOSIS — R351 Nocturia: Secondary | ICD-10-CM | POA: Diagnosis not present

## 2022-12-07 LAB — BLADDER SCAN AMB NON-IMAGING

## 2022-12-07 NOTE — Progress Notes (Signed)
   12/07/2022 3:56 PM   [name] [name] [birth year] [id number]  Reason for visit: Follow up nocturia  HPI: 72 year old male with Parkinson's disease here with his wife for follow-up of nocturia.  I originally saw him on 11/09/2022 and he had been having about a month of symptoms at that time with urinary frequency every hour overnight.  He was started on Flomax and he had originally felt that was helpful, but he feels like improvement has plateaued.  Currently getting up 2-4 times overnight which is increased from his baseline of once or twice overnight.  He uses CPAP, but he may be having some problems remembering to put the CPAP back on after he urinates overnight.  PVR was normal previously at 18, and normal again today at 0ml.  PSA was normal at 0.5.  We previously had discussed behavioral strategies including minimizing fluids before bedtime, avoiding bladder irritants, CPAP compliance.  They are not happy with his symptoms on the Flomax at that time and are interested in other treatment options.  I think it is reasonable to try Gemtesa 75 mg daily, and we discussed at length the impact of Parkinson's disease as well as sleep apnea on nocturia.  In the future could consider trial of desmopressin if persistent severe nocturia despite Othella Boyer, MD  [facility] [address] [place], [place] [address] [phone]

## 2023-01-18 ENCOUNTER — Ambulatory Visit: Payer: Medicare Other | Admitting: Urology

## 2023-01-18 ENCOUNTER — Encounter: Payer: Self-pay | Admitting: Urology

## 2023-01-18 VITALS — BP 132/50 | HR 61 | Ht 71.0 in | Wt 205.0 lb

## 2023-01-18 DIAGNOSIS — R310 Gross hematuria: Secondary | ICD-10-CM | POA: Diagnosis not present

## 2023-01-18 DIAGNOSIS — R351 Nocturia: Secondary | ICD-10-CM

## 2023-01-18 DIAGNOSIS — N3281 Overactive bladder: Secondary | ICD-10-CM | POA: Diagnosis not present

## 2023-01-18 LAB — BLADDER SCAN AMB NON-IMAGING

## 2023-01-18 MED ORDER — MIRABEGRON ER 50 MG PO TB24: 50.0000 mg | ORAL_TABLET | Freq: Every day | ORAL | 11 refills | Status: DC

## 2023-01-18 MED ORDER — GEMTESA 75 MG PO TABS: 75.0000 mg | ORAL_TABLET | Freq: Every day | ORAL | 11 refills | Status: DC

## 2023-01-18 NOTE — Progress Notes (Signed)
   01/18/2023 11:13 AM   [name] [name] 01-Oct-1950 [id number]  Reason for visit: Follow up nocturia, new gross hematuria  HPI: 72 year old male with Parkinson's disease here with his wife for follow-up of nocturia.  I originally saw him on 11/09/2022 and he had been having about a month of symptoms at that time with urinary frequency every hour overnight.  He was trialed on Flomax originally with only minimal improvement.  At our last visit he was still getting up 2-4 times per night, which had increased from his baseline of 1-2 times.  He uses CPAP, but he may be having some problems remembering to put the CPAP back on after he urinates overnight.  PVR's have been normal and less than 25 mL, including 34ml today.  PSA was normal at 0.5.  At our last visit we had discussed behavioral strategies including avoiding bladder irritants, minimizing fluid prior to bedtime, and CPAP compliance.  We also provided samples of Gemtesa 75 mg daily.  He does think he has had some significant improvement on the [place] and would like to continue with this medication.  He still has an occasional night while he will get up for 5 times, but overall improved.  He also reports 2 episodes of painless gross hematuria a few weeks ago.  We reviewed AUA guidelines regarding evaluation and management of patients with blood in the urine, and recommendations for CT urogram and cystoscopy.  They deferred cystoscopy but were amenable to CT, and will call with those results.  Finally, we discussed other options like desmopressin, but I recommended avoiding that at this time as he has had some improvement on the [place].  We also discussed at length the relationship between Parkinson's and overactive bladder/nocturia  Continue Gemtesa, prescription sent, okay to change to mirabegron if not affordable CT urogram for hematuria, call with results  Sondra Come, MD  [facility] [address] [place], [place] [address] [phone]

## 2023-01-18 NOTE — Patient Instructions (Signed)
We sent in both Gemtesa and Myrbetriq to your pharmacy, you only need to be on 1 of these medications.  I would recommend the Gemtesa, but if this is not affordable, you can try the Myrbetriq.  Myrbetriq should be a generic available within the next 2 to 3 months  We will call with CT scan results

## 2023-01-27 ENCOUNTER — Ambulatory Visit
Admission: RE | Admit: 2023-01-27 | Discharge: 2023-01-27 | Disposition: A | Discharge status: Home / Self Care | Payer: Medicare Other | Source: Ambulatory Visit | Attending: Urology | Admitting: Urology

## 2023-01-27 DIAGNOSIS — R351 Nocturia: Secondary | ICD-10-CM | POA: Diagnosis present

## 2023-01-27 DIAGNOSIS — I700 Atherosclerosis of aorta: Secondary | ICD-10-CM | POA: Diagnosis not present

## 2023-01-27 DIAGNOSIS — K5730 Diverticulosis of large intestine without perforation or abscess without bleeding: Secondary | ICD-10-CM | POA: Diagnosis not present

## 2023-01-27 DIAGNOSIS — K8020 Calculus of gallbladder without cholecystitis without obstruction: Secondary | ICD-10-CM | POA: Diagnosis not present

## 2023-01-27 DIAGNOSIS — R310 Gross hematuria: Secondary | ICD-10-CM | POA: Insufficient documentation

## 2023-01-27 MED ORDER — SODIUM CHLORIDE 0.9 % IV BOLUS
250.0000 mL | Freq: Once | INTRAVENOUS | Status: AC
  Administered 2023-01-27: 250 mL via INTRAVENOUS

## 2023-01-27 MED ORDER — IOHEXOL 300 MG/ML  SOLN
125.0000 mL | Freq: Once | INTRAMUSCULAR | Status: AC | PRN
  Administered 2023-01-27: 125 mL via INTRAVENOUS

## 2023-03-27 NOTE — Progress Notes (Unsigned)
[facility] [address] [place], [place] [address]  Pulmonary Sleep Medicine   Office Visit Note  Patient Name: [name] [name] DOB: [birth year] MRN [id number]    Chief Complaint: Obstructive Sleep Apnea visit  Brief History:  [name] [name] is seen today for an initial consult for for CPAP@ 6 cmH2O The patient has a 9 year history of sleep apnea. Patient is using PAP nightly.  The patient feels rested after sleeping with PAP.  The patient reports benefiting from PAP use. Reported sleepiness is  improved and the Epworth Sleepiness Score is 11 out of 24. The patient will occasionally take naps. The patient complains of the following: none.  The compliance download shows 83% compliance with an average use time of 5 hours 6 minutes. The AHI is 1.3.  The patient does not complain of limb movements disrupting sleep. The patient continues to require PAP therapy in order to eliminate sleep apnea.   ROS  General: (-) fever, (-) chills, (-) night sweat Nose and Sinuses: (-) nasal stuffiness or itchiness, (-) postnasal drip, (-) nosebleeds, (-) sinus trouble. Mouth and Throat: (-) sore throat, (-) hoarseness. Neck: (-) swollen glands, (-) enlarged thyroid, (-) neck pain. Respiratory: + cough, - shortness of breath, - wheezing. Neurologic: - numbness, - tingling. Psychiatric: - anxiety, - depression   Current Medication: Outpatient Encounter Medications as of 03/28/2023  Medication Sig   acetaminophen (TYLENOL) 325 MG tablet Take 650 mg by mouth every other day. Alternates with Aleve   aspirin 81 MG EC tablet Take 1 tablet (81 mg total) by mouth daily.   carbidopa-levodopa (SINEMET CR) 50-200 MG tablet Take 1 tablet by mouth at bedtime.   carbidopa-levodopa (SINEMET IR) 25-100 MG tablet 2 tablets in the AM, 2 tablets at noon, and 2 tablets between 4-5pm   Cholecalciferol (VITAMIN D-1000 MAX ST) 25 MCG (1000 UT) tablet Take 1 tablet by mouth daily.   clopidogrel (PLAVIX) 75 MG tablet  Take 1 tablet (75 mg total) by mouth daily.   ezetimibe (ZETIA) 10 MG tablet Take 1 tablet (10 mg total) by mouth daily.   lisinopril (ZESTRIL) 20 MG tablet Take 1 tablet (20 mg total) by mouth daily.   mirabegron ER (MYRBETRIQ) 50 MG TB24 tablet Take 1 tablet (50 mg total) by mouth daily.   rosuvastatin (CRESTOR) 20 MG tablet Take 1 tablet (20 mg total) by mouth daily.   [DISCONTINUED] Vibegron (GEMTESA) 75 MG TABS Take 1 tablet (75 mg total) by mouth daily.   No facility-administered encounter medications on file as of 03/28/2023.    Surgical History: Past Surgical History:  Procedure Laterality Date   ADENOIDECTOMY     CARDIAC CATHETERIZATION     a. 03/2014 NSTEMI/Cath: LM nl, LAD 8m (FFR 0.76), D1 165m, LCX 1105m, RCA 185m, EF 35%   CATARACT EXTRACTION Bilateral    COLONOSCOPY WITH PROPOFOL N/A 05/30/2018   Procedure: COLONOSCOPY WITH PROPOFOL;  Surgeon: Earline Mayotte, MD;  Location: [facility] ENDOSCOPY;  Service: Endoscopy;  Laterality: N/A;   CORONARY ARTERY BYPASS GRAFT N/A 03/28/2014   Procedure: CORONARY ARTERY BYPASS GRAFTING (CABG) x  4 using left internal mammary artery and bilateral saphenous leg vein.;  Surgeon: Purcell Nails, MD;  Location: [facility] OR;  Service: Open Heart Surgery;  Laterality: N/A;   INTRAOPERATIVE TRANSESOPHAGEAL ECHOCARDIOGRAM N/A 03/28/2014   Procedure: INTRAOPERATIVE TRANSESOPHAGEAL ECHOCARDIOGRAM;  Surgeon: Purcell Nails, MD;  Location: [facility] OR;  Service: Open Heart Surgery;  Laterality: N/A;    Medical History: Past Medical  History:  Diagnosis Date   Acute non-Q wave ST elevation myocardial infarction (STEMI) 9/13-14/2015   Atherosclerotic heart disease of native coronary artery with unstable angina pectoris (HCC)    a. 03/2014 NSTEMI/Cath: LM nl, LAD 44m (FFR 0.76), D1 121m, LCX 145m, RCA 143m, EF 35%; b. 03/2014 CABG x 4 (LIMA->LAD, VG->Diag, VG->OM, VG->PDA.   Essential hypertension    GERD (gastroesophageal reflux disease)    HFimpEF (heart failure  with improved ejection fraction) (HCC)    a. 03/2014 LV gram: EF 35%; b. 10/2018 Echo: EF 50-55%, mild LVH. Mildly dil LA. Mild MVP, mild to mod MR. Mild AI.   Hyperlipidemia with target LDL less than 70    Ischemic cardiomyopathy    a. 03/2014 EF 35% by LV gram; b. 10/2018 Echo: EF 50-55%.   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   LIMA to LAD, SVG to Diag, SVG to OM, SVG to PDA, EVH via bilateral thighs and right lower leg    Tobacco abuse     Family History: Non contributory to the present illness  Social History: Social History   Socioeconomic History   Marital status: Married    Spouse name: Not on file   Number of children: Not on file   Years of education: Not on file   Highest education level: Not on file  Occupational History   Not on file  Tobacco Use   Smoking status: Former    Current packs/day: 0.00    Average packs/day: 1 pack/day for 40.0 years (40.0 ttl pk-yrs)    Types: Cigarettes    Start date: 03/24/1974    Quit date: 03/24/2014    Years since quitting: 9.0    Passive exposure: Past   Smokeless tobacco: Never  Vaping Use   Vaping status: Never Used  Substance and Sexual Activity   Alcohol use: Yes    Comment: rare   Drug use: No   Sexual activity: Not on file  Other Topics Concern   Not on file  Social History Narrative   Not on file   Social Determinants of Health   Financial Resource Strain: Not on file  Food Insecurity: Not on file  Transportation Needs: Not on file  Physical Activity: Not on file  Stress: Not on file  Social Connections: Not on file  Intimate Partner Violence: Not on file    Vital Signs: Blood pressure 136/68, pulse (!) 51, resp. rate 16, height 5\' 10"  (1.778 m), weight 205 lb (93 kg), SpO2 97%. Body mass index is 29.41 kg/m.    Examination: General Appearance: The patient is well-developed, well-nourished, and in no distress. Neck Circumference: 39 cm Skin: Gross inspection of skin  unremarkable. Head: normocephalic, no gross deformities. Eyes: no gross deformities noted. ENT: ears appear grossly normal Neurologic: Alert and oriented. No involuntary movements.  STOP BANG RISK ASSESSMENT S (snore) Have you been told that you snore?     NO   T (tired) Are you often tired, fatigued, or sleepy during the day?   NO  O (obstruction) Do you stop breathing, choke, or gasp during sleep? NO   P (pressure) Do you have or are you being treated for high blood pressure? YES   B (BMI) Is your body index greater than 35 kg/m? NO   A (age) Are you 20 years old or older? YES   N (neck) Do you have a neck circumference greater than 16 inches?  NO   G (gender) Are you a male? YES   TOTAL STOP/BANG "YES" ANSWERS 3       A STOP-Bang score of 2 or less is considered low risk, and a score of 5 or more is high risk for having either moderate or severe OSA. For people who score 3 or 4, doctors may need to perform further assessment to determine how likely they are to have OSA.         EPWORTH SLEEPINESS SCALE:  Scale:  (0)= no chance of dozing; (1)= slight chance of dozing; (2)= moderate chance of dozing; (3)= high chance of dozing  Chance  Situtation    Sitting and reading: 2    Watching TV: 2    Sitting Inactive in public: 1    As a passenger in car: 2      Lying down to rest: 2    Sitting and talking: 0    Sitting quielty after lunch: 2    In a car, stopped in traffic: 0   TOTAL SCORE:   11 out of 24    SLEEP STUDIES:  PSG (08/2014) AHI 37.3/hr, min SpO2 87% Titration (11/2014) CPAP@ 6 cmH2O   CPAP COMPLIANCE DATA:  Date Range: 01/23/2023-03/23/2023  Average Daily Use: 5 hours 6 minutes  Median Use: 5 hours 9 minutes  Compliance for > 4 Hours: 83%  AHI: 1.3 respiratory events per hour  Days Used: 60/60 days  Mask Leak: 18.1  95th Percentile Pressure: 6         LABS: Recent Results (from the past 2160 hour(s))  Bladder Scan  (Post Void Residual) in office     Status: None   Collection Time: 01/18/23 11:24 AM  Result Value Ref Range   Scan Result 4ml     Radiology: CT HEMATURIA WORKUP  Result Date: 02/05/2023 CLINICAL DATA:  Gross hematuria. EXAM: CT ABDOMEN AND PELVIS WITHOUT AND WITH CONTRAST TECHNIQUE: Multidetector CT imaging of the abdomen and pelvis was performed following the standard protocol before and following the bolus administration of intravenous contrast. RADIATION DOSE REDUCTION: This exam was performed according to the departmental dose-optimization program which includes automated exposure control, adjustment of the mA and/or kV according to patient size and/or use of iterative reconstruction technique. CONTRAST:  OMNIPAQUE IOHEXOL 300 MG/ML  SOLN COMPARISON:  None Available. FINDINGS: Lower chest: Findings. Hepatobiliary: No suspicious focal abnormality within the liver parenchyma. Tiny layering calcified gallstones evident. No intrahepatic or extrahepatic biliary dilation. Pancreas: No focal mass lesion. No dilatation of the main duct. No intraparenchymal cyst. No peripancreatic edema. Spleen: No splenomegaly. No suspicious focal mass lesion. Adrenals/Urinary Tract: No adrenal nodule or mass. Precontrast imaging shows no stones in either kidney. Vascular calcification noted in the hilum of both kidneys. No ureteral or bladder stones. Imaging after IV contrast administration shows no suspicious enhancing lesion in either kidney. Delayed post-contrast imaging shows no wall thickening or soft tissue filling defect in either intrarenal collecting system or renal pelvis. Distal ureters are unopacified. Within this limitation there is no ureteral wall thickening or focal ureteral dilatation evident. No soft tissue mass seen along the course of either ureter. Delayed imaging of the bladder shows no focal wall thickening or mass lesion. Stomach/Bowel: Stomach is unremarkable. No gastric wall thickening. No  evidence of outlet obstruction. Duodenum is normally positioned as is the ligament of Treitz. No small bowel wall thickening. No small bowel dilatation. The terminal ileum is normal. The appendix is normal. No gross colonic  mass. No colonic wall thickening. Diverticular changes are noted in the left colon without evidence of diverticulitis. Vascular/Lymphatic: There is advanced atherosclerotic calcification of the abdominal aorta without aneurysm. There is no gastrohepatic or hepatoduodenal ligament lymphadenopathy. No retroperitoneal or mesenteric lymphadenopathy. No pelvic sidewall lymphadenopathy. Reproductive: The prostate gland and seminal vesicles are unremarkable. Other: No intraperitoneal free fluid. Musculoskeletal: No worrisome lytic or sclerotic osseous abnormality. IMPRESSION: 1. No acute findings in the abdomen or pelvis. Specifically, no findings to explain the patient's history of hematuria. 2. Cholelithiasis. 3. Left colonic diverticulosis without diverticulitis. 4.  Aortic Atherosclerosis (ICD10-I70.0). Electronically Signed   By: Kennith Center M.D.   On: 02/05/2023 15:35    No results found.  No results found.    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 The patient was reminded how to adjust mask fit and advised to change supplies regularly. The patient was also counselled on nightly use. The compliance is good. The AHI is 1.3.  Patient continues to require PAP to treat their apnea and is medically necessary. Pt did have 1 min of cheyne stokes on download and did have 2 nights of high apnea after set up where he reported frequent night disturbances and had a medication change which helped this and now numbers look good. Given copy of download to show cardiology.  1. OSA (obstructive sleep apnea) Continue nightly use  2. CPAP use counseling CPAP couseling-Discussed importance of adequate CPAP use as well as proper care and cleaning techniques of machine and all supplies.  3. Parkinson's disease, unspecified whether dyskinesia present, unspecified whether manifestations fluctuate Followed by neurology  4. Essential hypertension Continue current medication and f/u with PCP.  5. History of MI (myocardial infarction) Followed by cardiology   General Counseling: I have discussed the findings of the evaluation and examination with [name] [name].  I have also discussed any further diagnostic evaluation thatmay be needed or ordered today. [name] [name] verbalizes understanding of the findings of todays visit. We also reviewed his medications today and discussed drug interactions and side effects including but not limited excessive drowsiness and altered mental states. We also discussed that there is always a risk not just to him but also people around him. he has been encouraged to call the office with any questions or concerns that should arise related to todays visit.  No orders of the defined types were placed in this encounter.       I have personally obtained a history, examined the patient, evaluated laboratory and imaging results, formulated the assessment and plan and placed orders.  This patient was seen by Lynn Ito, PA-C in collaboration with Dr. Freda Munro as a part of collaborative care agreement.  Yevonne Pax, MD [facility] Diplomate ABMS Pulmonary Critical Care Medicine and Sleep Medicine

## 2023-03-28 ENCOUNTER — Ambulatory Visit (INDEPENDENT_AMBULATORY_CARE_PROVIDER_SITE_OTHER): Payer: Medicare Other | Admitting: Internal Medicine

## 2023-03-28 VITALS — BP 136/68 | HR 51 | Resp 16 | Ht 70.0 in | Wt 205.0 lb

## 2023-03-28 DIAGNOSIS — G20A1 Parkinson's disease without dyskinesia, without mention of fluctuations: Secondary | ICD-10-CM | POA: Diagnosis not present

## 2023-03-28 DIAGNOSIS — I10 Essential (primary) hypertension: Secondary | ICD-10-CM | POA: Diagnosis not present

## 2023-03-28 DIAGNOSIS — G4733 Obstructive sleep apnea (adult) (pediatric): Secondary | ICD-10-CM

## 2023-03-28 DIAGNOSIS — Z7189 Other specified counseling: Secondary | ICD-10-CM

## 2023-03-28 DIAGNOSIS — I252 Old myocardial infarction: Secondary | ICD-10-CM

## 2023-03-28 NOTE — Patient Instructions (Signed)

## 2023-08-09 ENCOUNTER — Ambulatory Visit: Payer: Medicare Other | Admitting: Urology

## 2023-08-16 ENCOUNTER — Ambulatory Visit: Payer: Medicare Other | Admitting: Urology

## 2023-08-16 VITALS — BP 115/71 | HR 61 | Ht 70.0 in | Wt 209.4 lb

## 2023-08-16 DIAGNOSIS — R351 Nocturia: Secondary | ICD-10-CM

## 2023-08-16 LAB — BLADDER SCAN AMB NON-IMAGING

## 2023-08-16 MED ORDER — MIRABEGRON ER 50 MG PO TB24: 50.0000 mg | ORAL_TABLET | Freq: Every day | ORAL | 11 refills | Status: AC

## 2023-08-16 NOTE — Patient Instructions (Signed)

## 2023-08-16 NOTE — Progress Notes (Addendum)
   08/16/2023 10:43 AM   Elspeth JAYSON Ingle February 21, 1951 [id number]  Reason for visit: Follow up nocturia  HPI: 73 year old male with Parkinson's disease here with his wife for follow-up of nocturia.  I originally saw him on 11/09/2022 and he had been having about a month of symptoms at that time with urinary frequency every hour overnight.  PVRs have always been normal.  PSA normal at 0.5.  He was trialed on Flomax by an outside provider with minimal improvement.  We worked on behavioral strategies and added Myrbetriq  50 mg daily, which has helped significantly.  He felt the Myrbetriq  was more helpful than Gemtesa .  He continues compliance with his CPAP machine.  Nocturia currently at 2-3x a night which is manageable.  He also had an episode of possible gross hematuria in June 2024, CT urogram was benign and he deferred cystoscopy.  We discussed other alternatives like a voiding diary to see if he is having nocturnal polyuria that may benefit from desmopressin, but he is not bothered enough at this time to consider additional medications.  Myrbetriq  50 mg daily refilled If worsening nocturia would recommend voiding diary to check volumes prior to considering desmopressin RTC 1 year PVR, sooner if problems   Redell JAYSON Burnet, MD  [facility] [address], [address] [place], [place] [address] [phone]

## 2023-09-26 ENCOUNTER — Ambulatory Visit
Admission: RE | Admit: 2023-09-26 | Discharge: 2023-09-26 | Disposition: A | Discharge status: Home / Self Care | Payer: Medicare Other | Source: Ambulatory Visit | Attending: Acute Care | Admitting: Acute Care

## 2023-09-26 DIAGNOSIS — Z87891 Personal history of nicotine dependence: Secondary | ICD-10-CM | POA: Insufficient documentation

## 2023-09-26 DIAGNOSIS — Z122 Encounter for screening for malignant neoplasm of respiratory organs: Secondary | ICD-10-CM | POA: Insufficient documentation

## 2023-10-20 ENCOUNTER — Telehealth: Payer: Self-pay | Admitting: Cardiovascular Disease

## 2023-10-20 MED ORDER — LISINOPRIL 20 MG PO TABS
20.0000 mg | ORAL_TABLET | Freq: Every day | ORAL | 0 refills | Status: DC
Start: 2023-10-20 — End: 2024-01-22

## 2023-10-20 MED ORDER — ROSUVASTATIN CALCIUM 20 MG PO TABS: 20.0000 mg | ORAL_TABLET | Freq: Every day | ORAL | 0 refills | Status: DC

## 2023-10-20 NOTE — Addendum Note (Signed)
 Addended by: Ihor Dow on: 10/20/2023 04:15 PM   Modules accepted: Orders

## 2023-10-20 NOTE — Telephone Encounter (Signed)
*  STAT* If patient is at the pharmacy, call can be transferred to refill team.   1. Which medications need to be refilled? (please list name of each medication and dose if known) lisinopril (ZESTRIL) 20 MG tablet   rosuvastatin (CRESTOR) 20 MG tablet   2. Which pharmacy/location (including street and city if local pharmacy) is medication to be sent to? [facility] - [place], [place] - [address]   3. Do they need a 30 day or 90 day supply? 90

## 2023-10-20 NOTE — Telephone Encounter (Signed)
Pt is scheduled on 4/21.

## 2023-10-30 ENCOUNTER — Encounter: Payer: Self-pay | Admitting: Physician Assistant

## 2023-10-30 ENCOUNTER — Ambulatory Visit: Attending: Physician Assistant | Admitting: Physician Assistant

## 2023-10-30 VITALS — BP 136/66 | HR 63 | Ht 71.0 in | Wt 213.0 lb

## 2023-10-30 DIAGNOSIS — R011 Cardiac murmur, unspecified: Secondary | ICD-10-CM

## 2023-10-30 DIAGNOSIS — I358 Other nonrheumatic aortic valve disorders: Secondary | ICD-10-CM

## 2023-10-30 DIAGNOSIS — I2510 Atherosclerotic heart disease of native coronary artery without angina pectoris: Secondary | ICD-10-CM | POA: Diagnosis not present

## 2023-10-30 DIAGNOSIS — E785 Hyperlipidemia, unspecified: Secondary | ICD-10-CM

## 2023-10-30 DIAGNOSIS — I255 Ischemic cardiomyopathy: Secondary | ICD-10-CM

## 2023-10-30 DIAGNOSIS — Z951 Presence of aortocoronary bypass graft: Secondary | ICD-10-CM

## 2023-10-30 DIAGNOSIS — I10 Essential (primary) hypertension: Secondary | ICD-10-CM

## 2023-10-30 DIAGNOSIS — I5032 Chronic diastolic (congestive) heart failure: Secondary | ICD-10-CM

## 2023-10-30 NOTE — Patient Instructions (Signed)
 Medication Instructions:  Your Physician recommend you continue on your current medication as directed.    *If you need a refill on your cardiac medications before your next appointment, please call your pharmacy*  Lab Work: None ordered at this time  If you have labs (blood work) drawn today and your tests are completely normal, you will receive your results only by: MyChart Message (if you have MyChart) OR A paper copy in the mail If you have any lab test that is abnormal or we need to change your treatment, we will call you to review the results.  Testing/Procedures: Your physician has requested that you have an echocardiogram. Echocardiography is a painless test that uses sound waves to create images of your heart. It provides your doctor with information about the size and shape of your heart and how well your heart's chambers and valves are working.   You may receive an ultrasound enhancing agent through an IV if needed to better visualize your heart during the echo. This procedure takes approximately one hour.  There are no restrictions for this procedure.  This will take place at [address] [facility]) [address], [place] [address]  Please note: We ask at that you not bring children with you during ultrasound (echo/ vascular) testing. Due to room size and safety concerns, children are not allowed in the ultrasound rooms during exams. Our front office staff cannot provide observation of children in our lobby area while testing is being conducted. An adult accompanying a patient to their appointment will only be allowed in the ultrasound room at the discretion of the ultrasound technician under special circumstances. We apologize for any inconvenience.   Follow-Up: At [facility], you and your health needs are our priority.  As part of our continuing mission to provide you with exceptional heart care, our providers are all part of one team.  This team includes  your primary Cardiologist (physician) and Advanced Practice Providers or APPs (Physician Assistants and Nurse Practitioners) who all work together to provide you with the care you need, when you need it.  Your next appointment:   12 month(s)  Provider:   You may see Timothy Gollan, MD or Varney Gentleman, PA-C

## 2023-10-30 NOTE — Progress Notes (Signed)
 Cardiology Office Note    Date:  10/30/2023   ID:  EULA MAZZOLA, DOB 1951-05-23, MRN [id number]  PCP:  Westley Hammers, MD  Cardiologist:  Belva Boyden, MD  Electrophysiologist:  None   Chief Complaint: Follow up  History of Present Illness:   [name] [name] is a 73 y.o. male with history of CAD status post CABG in 03/2014, HFimpEF/ICM, HTN, HLD, NSVT, nonsustained SVT, lumbar radiculitis with neurogenic claudication, Parkinson's, remote tobacco use, sleep apnea on CPAP, and GERD who presents for follow-up of CAD.   He was admitted to the hospital in 03/2014 with an NSTEMI.  Cardiac cath showed severe multivessel CAD.  Echo at that time showed an EF of 30 to 35%, mild LVH, mildly dilated LV, mid anterior septal/anterior lateral/inferolateral akinesis, basal anterior/septal/lateral akinesis as well as akinesis of the apex, grade 1 diastolic dysfunction, trivial aortic insufficiency, trivial mitral regurgitation, normal RVSF, and a trivial pericardial effusion.  In this setting, he underwent four-vessel CABG on 03/28/2014 with LIMA to LAD, SVG to diagonal, SVG to OM, SVG to PDA.  He has not required ischemic evaluation since his bypass.  Lower extremity arterial ultrasound in 10/2017 showed normal bilateral ABIs.  Echo in 10/2018 showed an improved LV systolic function with an EF of 50 to 55% with mild to moderate mitral regurgitation.  He was seen in the office in 12/2021 and noted a several month history of randomly occurring dizziness/dizziness with positional change.  Zio patch showed a predominant rhythm of sinus with an average rate of 60 bpm, intermittent bundle branch block, 2 episodes of NSVT with the longest interval lasting 14.6 seconds, 12 episodes of SVT with the fastest and longest interval lasting 16 beats.  Carotid artery ultrasound showed 1 to 39% bilateral ICA stenoses with antegrade flow of the vertebral arteries bilaterally and normal flow hemodynamics of the bilateral subclavian  arteries.  Echo demonstrated an EF of 50%, no regional wall motion abnormalities, mild LVH, grade 2 diastolic dysfunction, normal RV systolic function and ventricular cavity size, mildly to moderately dilated left atrium, trivial mitral regurgitation, tricuspid aortic valve with mild regurgitation and aortic valve sclerosis without evidence of stenosis.  Carvedilol  was held for bradycardia with improvement in dizziness.  He was last seen in the office in 08/2022 and was doing well from a cardiac perspective.  No changes in pharmacotherapy were pursued at that time.  He comes in accompanied by his wife today and is doing well from a cardiac perspective.  No symptoms of angina or cardiac decompensation.  No presyncope or syncope.  No falls.  No symptoms concerning for bleeding.  No progressive orthopnea.  He does note some chronic longstanding right ankle edema that is stable.  His wife does try and have him elevate his legs when sitting.  Unable to wear compression socks due to underlying Parkinson's and comorbidities with wife's health.  No acute cardiac concerns at this time.   Labs independently reviewed: 08/2023 - Hgb 13.2, PLT 206, BUN 27, serum creatinine 1.39, potassium 4.6, TC 121, TG 52, HDL 45, LDL 64, A1c 5.9, TSH normal   Past Medical History:  Diagnosis Date   Acute non-Q wave ST elevation myocardial infarction (STEMI) 9/13-14/2015   Atherosclerotic heart disease of native coronary artery with unstable angina pectoris (HCC)    a. 03/2014 NSTEMI/Cath: LM nl, LAD 90m (FFR 0.76), D1 142m, LCX 19m, RCA 143m, EF 35%; b. 03/2014 CABG x 4 (LIMA->LAD, VG->Diag, VG->OM, VG->PDA.   Essential  hypertension    GERD (gastroesophageal reflux disease)    HFimpEF (heart failure with improved ejection fraction) (HCC)    a. 03/2014 LV gram: EF 35%; b. 10/2018 Echo: EF 50-55%, mild LVH. Mildly dil LA. Mild MVP, mild to mod MR. Mild AI.   Hyperlipidemia with target LDL less than 70    Ischemic cardiomyopathy     a. 03/2014 EF 35% by LV gram; b. 10/2018 Echo: EF 50-55%.   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   LIMA to LAD, SVG to Diag, SVG to OM, SVG to PDA, EVH via bilateral thighs and right lower leg    Tobacco abuse     Past Surgical History:  Procedure Laterality Date   ADENOIDECTOMY     CARDIAC CATHETERIZATION     a. 03/2014 NSTEMI/Cath: LM nl, LAD 65m (FFR 0.76), D1 127m, LCX 124m, RCA 186m, EF 35%   CATARACT EXTRACTION Bilateral    COLONOSCOPY WITH PROPOFOL  N/A 05/30/2018   Procedure: COLONOSCOPY WITH PROPOFOL ;  Surgeon: Marshall Skeeter, MD;  Location: [facility] ENDOSCOPY;  Service: Endoscopy;  Laterality: N/A;   CORONARY ARTERY BYPASS GRAFT N/A 03/28/2014   Procedure: CORONARY ARTERY BYPASS GRAFTING (CABG) x  4 using left internal mammary artery and bilateral saphenous leg vein.;  Surgeon: Gardenia Jump, MD;  Location: [facility] OR;  Service: Open Heart Surgery;  Laterality: N/A;   INTRAOPERATIVE TRANSESOPHAGEAL ECHOCARDIOGRAM N/A 03/28/2014   Procedure: INTRAOPERATIVE TRANSESOPHAGEAL ECHOCARDIOGRAM;  Surgeon: Gardenia Jump, MD;  Location: [facility] OR;  Service: Open Heart Surgery;  Laterality: N/A;    Current Medications: Current Meds  Medication Sig   acetaminophen  (TYLENOL ) 325 MG tablet Take 650 mg by mouth every other day. Alternates with Aleve   aspirin  81 MG EC tablet Take 1 tablet (81 mg total) by mouth daily.   carbidopa-levodopa (SINEMET CR) 50-200 MG tablet Take 1 tablet by mouth at bedtime.   carbidopa-levodopa (SINEMET IR) 25-100 MG tablet 2 tablets in the AM, 2 tablets at noon, and 2 tablets between 4-5pm   Cholecalciferol (VITAMIN D-1000 MAX ST) 25 MCG (1000 UT) tablet Take 1 tablet by mouth daily.   clopidogrel  (PLAVIX ) 75 MG tablet Take 1 tablet (75 mg total) by mouth daily.   ezetimibe  (ZETIA ) 10 MG tablet Take 1 tablet (10 mg total) by mouth daily.   lisinopril  (ZESTRIL ) 20 MG tablet Take 1 tablet (20 mg total) by mouth daily.   mirabegron  ER  (MYRBETRIQ ) 50 MG TB24 tablet Take 1 tablet (50 mg total) by mouth daily.   rosuvastatin  (CRESTOR ) 20 MG tablet Take 1 tablet (20 mg total) by mouth daily.    Allergies:   Sulfa antibiotics, Morphine , and Morphine  and codeine   Social History   Socioeconomic History   Marital status: Married    Spouse name: Not on file   Number of children: Not on file   Years of education: Not on file   Highest education level: Not on file  Occupational History   Not on file  Tobacco Use   Smoking status: Former    Current packs/day: 0.00    Average packs/day: 1 pack/day for 40.0 years (40.0 ttl pk-yrs)    Types: Cigarettes    Start date: 03/24/1974    Quit date: 03/24/2014    Years since quitting: 9.6    Passive exposure: Past   Smokeless tobacco: Never  Vaping Use   Vaping status: Never Used  Substance and Sexual Activity  Alcohol use: Yes    Comment: rare   Drug use: No   Sexual activity: Not on file  Other Topics Concern   Not on file  Social History Narrative   Not on file   Social Drivers of Health   Financial Resource Strain: Not on file  Food Insecurity: Not on file  Transportation Needs: Not on file  Physical Activity: Not on file  Stress: Not on file  Social Connections: Not on file     Family History:  The patient's Family history is unknown by patient.  ROS:   12-point review of systems is negative unless otherwise noted in the HPI.   EKGs/Labs/Other Studies Reviewed:    Studies reviewed were summarized above. The additional studies were reviewed today:  2D echo 02/15/2022: 1. Left ventricular ejection fraction, by estimation, is 50%. The left  ventricle has low normal function. The left ventricle has no regional wall  motion abnormalities. There is mild left ventricular hypertrophy. Left  ventricular diastolic parameters are   consistent with Grade II diastolic dysfunction (pseudonormalization).   2. Right ventricular systolic function is normal. The right  ventricular  size is normal.   3. Left atrial size was mild to moderately dilated.   4. The mitral valve is degenerative. Trivial mitral valve regurgitation.   5. The aortic valve is tricuspid. Aortic valve regurgitation is mild.  Aortic valve sclerosis/calcification is present, without any evidence of  aortic stenosis. __________   Carotid artery ultrasound 02/15/2022: Summary:  Right Carotid: Velocities in the right ICA are consistent with a 1-39%  stenosis. Non-hemodynamically significant plaque <50% noted in the  CCA. The ECA appears <50% stenosed.   Left Carotid: Velocities in the left ICA are consistent with a 1-39%  stenosis. Non-hemodynamically significant plaque <50% noted in the  CCA. The ECA appears <50% stenosed.   Vertebrals:  Bilateral vertebral arteries demonstrate antegrade flow.  Subclavians: Normal flow hemodynamics were seen in bilateral subclavian arteries. __________   Zio patch 12/2021: Normal sinus rhythm Patient had a min HR of 45 bpm, max HR of 164 bpm, and avg HR of 60 bpm.    Intermittent Bundle Branch Block was present.  2 Ventricular Tachycardia runs occurred, the run with the fastest interval lasting 14.6 secs with a max rate of 136 bpm (avg 115 bpm); the run with the fastest interval was also the longest.    12 Supraventricular Tachycardia/atrial tachycardia runs occurred, the run with the fastest interval lasting 16 beats with a max rate of 164 bpm (avg 129 bpm); the run with the fastest interval was also the longest (not patient triggered)   Idioventricular Rhythm was present.  Isolated SVEs were rare (<1.0%), SVE Couplets were rare (<1.0%), and SVE Triplets were rare (<1.0%).    Isolated VEs were rare (<1.0%, 2944), VE Couplets were rare (<1.0%, 135), and VE Triplets were rare (<1.0%, 10). Ventricular Trigeminy was present.   Patient triggered events associated with normal sinus rhythm. __________   2D echo 11/05/2018: 1. The left ventricle has  low normal systolic function, with an ejection  fraction of 50-55%. The cavity size was mildly dilated. There is mild  concentric left ventricular hypertrophy. Left ventricular diastolic  Doppler parameters are indeterminate.   2. The right ventricle has normal systolic function. The cavity was  normal. There is no increase in right ventricular wall thickness.   3. Left atrial size was mildly dilated.   4. Mild mitral valve prolapse.   5. The mitral valve  is grossly normal. Mitral valve regurgitation is mild  to moderate by color flow Doppler.   6. The tricuspid valve is grossly normal.   7. The aortic valve is grossly normal. Mild thickening of the aortic  valve. Mild calcification of the aortic valve. Aortic valve regurgitation  is mild by color flow Doppler. __________   LHC 03/2014: Severe multivessel CAD including severe proximal LAD disease with collaterals from left to right suggesting CTO with FFR being positive, occluded mid LCx with left to left collaterals suggesting CTO, occluded mid RCA, occluded proximal diagonal.  CABG recommended.   EKG:  EKG is ordered today.  The EKG ordered today demonstrates NSR, 63 bpm, LVH, baseline artifact, lateral T wave inversion and nonspecific changes consistent with prior tracings  Recent Labs: No results found for requested labs within last 365 days.  Recent Lipid Panel    Component Value Date/Time   CHOL 103 11/05/2018 0850   CHOL 131 03/25/2014 0459   TRIG 65 11/05/2018 0850   TRIG 61 03/25/2014 0459   HDL 42 11/05/2018 0850   HDL 42 03/25/2014 0459   CHOLHDL 2.5 11/05/2018 0850   VLDL 13 11/05/2018 0850   VLDL 12 03/25/2014 0459   LDLCALC NOT CALCULATED 11/05/2018 0850   LDLCALC 77 03/25/2014 0459    PHYSICAL EXAM:    VS:  BP 136/66 (BP Location: Left Arm, Patient Position: Sitting, Cuff Size: Normal)   Pulse 63   Ht 5\' 11"  (1.803 m)   Wt 213 lb (96.6 kg)   SpO2 99%   BMI 29.71 kg/m   BMI: Body mass index is 29.71  kg/m.  Physical Exam Vitals reviewed.  Constitutional:      Appearance: He is well-developed.  HENT:     Head: Normocephalic and atraumatic.  Eyes:     General:        Right eye: No discharge.        Left eye: No discharge.  Cardiovascular:     Rate and Rhythm: Normal rate and regular rhythm.     Heart sounds: S1 normal and S2 normal. Heart sounds not distant. No midsystolic click and no opening snap. Murmur heard.     Systolic murmur is present with a grade of 2/6 at the upper right sternal border.     No friction rub.  Pulmonary:     Effort: Pulmonary effort is normal. No respiratory distress.     Breath sounds: Normal breath sounds. No decreased breath sounds, wheezing, rhonchi or rales.  Chest:     Chest wall: No tenderness.  Musculoskeletal:     Cervical back: Normal range of motion.     Comments: Trivial bilateral ankle edema with the right being greater than the left.  Skin:    General: Skin is warm and dry.     Nails: There is no clubbing.  Neurological:     Mental Status: He is alert and oriented to person, place, and time.  Psychiatric:        Speech: Speech normal.        Behavior: Behavior normal.        Thought Content: Thought content normal.        Judgment: Judgment normal.     Wt Readings from Last 3 Encounters:  10/30/23 213 lb (96.6 kg)  08/16/23 209 lb 6.4 oz (95 kg)  03/28/23 205 lb (93 kg)     ASSESSMENT & PLAN:   CAD status post CABG without angina: He is without symptoms  of angina or cardiac decompensation.  He has not required ischemic evaluation since bypass.  Continue aggressive risk factor modification and secondary prevention including aspirin , clopidogrel , rosuvastatin , ezetimibe , and lisinopril .  No indication for ischemic testing at this time.  HFimpEF secondary to ICM: Euvolemic and well compensated on lisinopril  20 mg.  Not requiring standing loop diuretic.  No longer on beta-blocker given bradycardia.  Defer further escalation of  GDMT given improvement in LV systolic function and lack of heart failure symptoms.  This will need to be revisited should he redevelop cardiomyopathy.  Cardiac murmur: Echo from 02/2022 with trivial mitral regurgitation and aortic valve sclerosis without evidence of stenosis with mild insufficiency.  Update echo.  HTN: Blood pressure is reasonably controlled in the office today.  He remains on lisinopril  20 mg.  HLD: LDL 64 in 08/2023.  He remains on rosuvastatin  20 mg and ezetimibe  10 mg.     Disposition: F/u with Dr. Gollan or an APP in 12 months, sooner if needed.   Medication Adjustments/Labs and Tests Ordered: Current medicines are reviewed at length with the patient today.  Concerns regarding medicines are outlined above. Medication changes, Labs and Tests ordered today are summarized above and listed in the Patient Instructions accessible in Encounters.   Signed, Varney Gentleman, PA-C 10/30/2023 11:44 AM     [facility] [address] [place], [place] [address] [phone]

## 2023-11-03 ENCOUNTER — Other Ambulatory Visit: Payer: Self-pay

## 2023-11-03 DIAGNOSIS — Z87891 Personal history of nicotine dependence: Secondary | ICD-10-CM

## 2023-11-03 DIAGNOSIS — Z122 Encounter for screening for malignant neoplasm of respiratory organs: Secondary | ICD-10-CM

## 2023-11-09 ENCOUNTER — Encounter: Payer: Self-pay | Admitting: Cardiovascular Disease

## 2023-11-09 MED ORDER — EZETIMIBE 10 MG PO TABS: 10.0000 mg | ORAL_TABLET | Freq: Every day | ORAL | 0 refills | Status: DC

## 2023-11-09 MED ORDER — CLOPIDOGREL BISULFATE 75 MG PO TABS: 75.0000 mg | ORAL_TABLET | Freq: Every day | ORAL | 0 refills | Status: DC

## 2023-12-22 ENCOUNTER — Other Ambulatory Visit: Payer: Self-pay | Admitting: Cardiovascular Disease

## 2023-12-23 ENCOUNTER — Encounter: Payer: Self-pay | Admitting: Cardiovascular Disease

## 2023-12-25 ENCOUNTER — Other Ambulatory Visit: Payer: Self-pay | Admitting: *Deleted

## 2023-12-25 MED ORDER — CLOPIDOGREL BISULFATE 75 MG PO TABS: 75.0000 mg | ORAL_TABLET | Freq: Every day | ORAL | 3 refills | Status: AC

## 2023-12-25 MED ORDER — EZETIMIBE 10 MG PO TABS: 10.0000 mg | ORAL_TABLET | Freq: Every day | ORAL | 3 refills | Status: AC

## 2023-12-25 NOTE — Progress Notes (Signed)
Rx sent as requested for Zetia

## 2023-12-25 NOTE — Progress Notes (Signed)
 Spoke with pt and pt's wife (speaker phone) regarding refills.  Both Plavix  and Zetia  were sent to [facility]) mail order.  They are back from vacation and are resuming their 90 day refills.  Pt and wife appreciative of call back and follow up.

## 2023-12-27 ENCOUNTER — Other Ambulatory Visit

## 2023-12-27 ENCOUNTER — Ambulatory Visit: Attending: Physician Assistant

## 2023-12-27 DIAGNOSIS — R011 Cardiac murmur, unspecified: Secondary | ICD-10-CM

## 2023-12-27 DIAGNOSIS — I358 Other nonrheumatic aortic valve disorders: Secondary | ICD-10-CM

## 2023-12-27 LAB — ECHOCARDIOGRAM COMPLETE
AR max vel: 3.14 cm2
AV Area VTI: 3.39 cm2
AV Area mean vel: 3.2 cm2
AV Mean grad: 3 mmHg
AV Peak grad: 6.6 mmHg
Ao pk vel: 1.28 m/s
Area-P 1/2: 2.95 cm2
S' Lateral: 3.72 cm

## 2023-12-28 ENCOUNTER — Ambulatory Visit: Payer: Self-pay | Admitting: Physician Assistant

## 2024-01-20 ENCOUNTER — Encounter: Payer: Self-pay | Admitting: Cardiovascular Disease

## 2024-01-22 MED ORDER — ROSUVASTATIN CALCIUM 20 MG PO TABS: 20.0000 mg | ORAL_TABLET | Freq: Every day | ORAL | 3 refills | Status: AC

## 2024-01-22 MED ORDER — LISINOPRIL 20 MG PO TABS: 20.0000 mg | ORAL_TABLET | Freq: Every day | ORAL | 3 refills | Status: DC

## 2024-04-28 NOTE — Progress Notes (Unsigned)
 [facility] [address] [place], [place] [address]  Pulmonary Sleep Medicine   Office Visit Note  Patient Name: [name] [name] DOB: May 21, 1951 MRN [id number]    Chief Complaint: Obstructive Sleep Apnea visit  Brief History:  [name] [name] is seen today for an annual follow up visit for CPAP@ 6 cmH2O. The patient has a 10 year history of sleep apnea. Patient is not using PAP nightly.  The patient feels rested after sleeping with PAP.  The patient reports benefiting from PAP use. Reported sleepiness is  improved and the Epworth Sleepiness Score is 13 out of 24. The patient will occasionally take naps. The patient complains of the following: none. Pt has Parkinson's Disease and Nocturia and is not able to stay asleep throughout the night. Patient reports always waking up at 0400 am and unable to return to sleep.  The compliance download shows 55% compliance with an average use time of 4 hours 14 minutes. The AHI is 1.5.  The patient does complain of limb movements disrupting sleep due to his parkinson's disease. He does occasionally have dream enactment. The patient continues to require PAP therapy in order to eliminate sleep apnea.   ROS  General: (-) fever, (-) chills, (-) night sweat Nose and Sinuses: (-) nasal stuffiness or itchiness, (-) postnasal drip, (-) nosebleeds, (-) sinus trouble. Mouth and Throat: (-) sore throat, (-) hoarseness. Neck: (-) swollen glands, (-) enlarged thyroid, (-) neck pain. Respiratory: - cough, - shortness of breath, - wheezing. Neurologic: - numbness, - tingling. Psychiatric: - anxiety, - depression   Current Medication: Outpatient Encounter Medications as of 04/29/2024  Medication Sig   acetaminophen  (TYLENOL ) 325 MG tablet Take 650 mg by mouth every other day. Alternates with Aleve   aspirin  81 MG EC tablet Take 1 tablet (81 mg total) by mouth daily.   carbidopa-levodopa (SINEMET CR) 50-200 MG tablet Take 1 tablet by mouth at bedtime.    carbidopa-levodopa (SINEMET IR) 25-100 MG tablet 2 tablets in the AM, 2 tablets at noon, and 2 tablets between 4-5pm   Cholecalciferol (VITAMIN D-1000 MAX ST) 25 MCG (1000 UT) tablet Take 1 tablet by mouth daily.   clopidogrel  (PLAVIX ) 75 MG tablet Take 1 tablet (75 mg total) by mouth daily.   ezetimibe  (ZETIA ) 10 MG tablet Take 1 tablet (10 mg total) by mouth daily.   lisinopril  (ZESTRIL ) 20 MG tablet Take 1 tablet (20 mg total) by mouth daily.   mirabegron  ER (MYRBETRIQ ) 50 MG TB24 tablet Take 1 tablet (50 mg total) by mouth daily.   rosuvastatin  (CRESTOR ) 20 MG tablet Take 1 tablet (20 mg total) by mouth daily.   No facility-administered encounter medications on file as of 04/29/2024.    Surgical History: Past Surgical History:  Procedure Laterality Date   ADENOIDECTOMY     CARDIAC CATHETERIZATION     a. 03/2014 NSTEMI/Cath: LM nl, LAD 87m (FFR 0.76), D1 173m, LCX 118m, RCA 138m, EF 35%   CATARACT EXTRACTION Bilateral    COLONOSCOPY WITH PROPOFOL  N/A 05/30/2018   Procedure: COLONOSCOPY WITH PROPOFOL ;  Surgeon: Dessa Reyes ORN, MD;  Location: [facility] ENDOSCOPY;  Service: Endoscopy;  Laterality: N/A;   CORONARY ARTERY BYPASS GRAFT N/A 03/28/2014   Procedure: CORONARY ARTERY BYPASS GRAFTING (CABG) x  4 using left internal mammary artery and bilateral saphenous leg vein.;  Surgeon: Sudie VEAR Laine, MD;  Location: [facility] OR;  Service: Open Heart Surgery;  Laterality: N/A;   INTRAOPERATIVE TRANSESOPHAGEAL ECHOCARDIOGRAM N/A 03/28/2014   Procedure: INTRAOPERATIVE TRANSESOPHAGEAL ECHOCARDIOGRAM;  Surgeon: Sudie VEAR Laine, MD;  Location: [facility] OR;  Service: Open Heart Surgery;  Laterality: N/A;    Medical History: Past Medical History:  Diagnosis Date   Acute non-Q wave ST elevation myocardial infarction (STEMI) 9/13-14/2015   Atherosclerotic heart disease of native coronary artery with unstable angina pectoris (HCC)    a. 03/2014 NSTEMI/Cath: LM nl, LAD 75m (FFR 0.76), D1 136m, LCX 145m, RCA 115m,  EF 35%; b. 03/2014 CABG x 4 (LIMA->LAD, VG->Diag, VG->OM, VG->PDA.   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 03/2014 LV gram: EF 35%; b. 10/2018 Echo: EF 50-55%, mild LVH. Mildly dil LA. Mild MVP, mild to mod MR. Mild AI.   Hyperlipidemia with target LDL less than 70    Ischemic cardiomyopathy    a. 03/2014 EF 35% by LV gram; b. 10/2018 Echo: EF 50-55%.   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   LIMA to LAD, SVG to Diag, SVG to OM, SVG to PDA, EVH via bilateral thighs and right lower leg    Tobacco abuse     Family History: Non contributory to the present illness  Social History: Social History   Socioeconomic History   Marital status: Married    Spouse name: Not on file   Number of children: Not on file   Years of education: Not on file   Highest education level: Not on file  Occupational History   Not on file  Tobacco Use   Smoking status: Former    Current packs/day: 0.00    Average packs/day: 1 pack/day for 40.0 years (40.0 ttl pk-yrs)    Types: Cigarettes    Start date: 03/24/1974    Quit date: 03/24/2014    Years since quitting: 10.1    Passive exposure: Past   Smokeless tobacco: Never  Vaping Use   Vaping status: Never Used  Substance and Sexual Activity   Alcohol use: Yes    Comment: rare   Drug use: No   Sexual activity: Not on file  Other Topics Concern   Not on file  Social History Narrative   Not on file   Social Drivers of Health   Financial Resource Strain: Low Risk  (03/20/2024)   Received from [facility]   Overall Financial Resource Strain (CARDIA)    Difficulty of Paying Living Expenses: Not hard at all  Food Insecurity: No Food Insecurity (03/20/2024)   Received from [facility]   Hunger Vital Sign    Within the past 12 months, you worried that your food would run out before you got the money to  buy more.: Never true    Within the past 12 months, the food you bought just didn't last and you didn't have money to get more.: Never true  Transportation Needs: No Transportation Needs (03/20/2024)   Received from [facility] - Transportation    In the past 12 months, has lack of transportation kept you from medical appointments or from getting medications?: No    Lack of Transportation (Non-Medical): No  Physical Activity: Not on file  Stress: Not on file  Social Connections: Not on file  Intimate Partner Violence: Not on file    Vital Signs: There were no vitals taken for this visit. There is no height or weight on file to calculate BMI.  Examination: General Appearance: The patient is well-developed, well-nourished, and in no distress. Neck Circumference: 39 cm Skin: Gross inspection of skin unremarkable. Head: normocephalic, no gross deformities. Eyes: no gross deformities noted. ENT: ears appear grossly normal Neurologic: Alert and oriented. No involuntary movements.  STOP BANG RISK ASSESSMENT S (snore) Have you been told that you snore?     YES   T (tired) Are you often tired, fatigued, or sleepy during the day?   YES  O (obstruction) Do you stop breathing, choke, or gasp during sleep? NO   P (pressure) Do you have or are you being treated for high blood pressure? YES   B (BMI) Is your body index greater than 35 kg/m? NO   A (age) Are you 51 years old or older? YES   N (neck) Do you have a neck circumference greater than 16 inches?   NO   G (gender) Are you a male? YES   TOTAL STOP/BANG "YES" ANSWERS 5       A STOP-Bang score of 2 or less is considered low risk, and a score of 5 or more is high risk for having either moderate or severe OSA. For people who score 3 or 4, doctors may need to perform further assessment to determine how likely they are to have OSA.         EPWORTH SLEEPINESS SCALE:  Scale:  (0)= no chance of dozing;  (1)= slight chance of dozing; (2)= moderate chance of dozing; (3)= high chance of dozing  Chance  Situtation    Sitting and reading: 1    Watching TV: 3    Sitting Inactive in public: 0    As a passenger in car: 2      Lying down to rest: 3    Sitting and talking: 1    Sitting quielty after lunch: 3    In a car, stopped in traffic: 0   TOTAL SCORE:   13 out of 24    SLEEP STUDIES:  PSG (08/2014) AHI 37.3/hr, min SpO2 87% Titration (11/2014) CPAP@ 6 cmH2O   CPAP COMPLIANCE DATA:  Date Range: 04/27/2023-04/25/2024  Average Daily Use: 4 hours 14 minutes  Median Use: 4 hours 18 minutes  Compliance for > 4 Hours: 55%  AHI: 1.5 respiratory events per hour  Days Used: 353/365 days  Mask Leak: 19.7  95th Percentile Pressure: 6         LABS: No results found for this or any previous visit (from the past 2160 hours).  Radiology: CT CHEST LUNG CA SCREEN LOW DOSE W/O CM Result Date: 11/02/2023 CLINICAL DATA:  7 1 pack-year smoking history/quit 7 years ago EXAM: CT CHEST WITHOUT CONTRAST LOW-DOSE FOR LUNG CANCER SCREENING TECHNIQUE: Multidetector CT imaging of the chest was performed following the standard protocol without IV contrast. RADIATION DOSE REDUCTION: This exam was performed according to the departmental dose-optimization program which includes automated exposure control, adjustment of the mA and/or kV according to patient size and/or use of iterative reconstruction technique. COMPARISON:  09/22/2022 FINDINGS: Cardiovascular: Aortic atherosclerosis. Upper normal ascending aortic caliber 3.9 cm. Tortuous thoracic aorta. Moderate cardiomegaly with remote left ventricular apical infarct. Median sternotomy for CABG. Aortic valve calcification. Mediastinum/Nodes: No mediastinal or hilar adenopathy, given limitations of unenhanced CT. Lungs/Pleura: No pleural fluid. Mild centrilobular and paraseptal emphysema. Biapical pleuroparenchymal scarring. Right-sided  calcified granulomas of maximally 2.5 mm. Upper Abdomen: Probable dependent gallstone or stones. Normal imaged portions of the liver, stomach, pancreas, spleen, adrenal glands, kidneys. Musculoskeletal:  Intact sternotomy wires. IMPRESSION: 1. Lung-RADS 1, negative. Continue annual screening with low-dose chest CT without contrast in 12 months. 2. Probable cholelithiasis. 3. Aortic Atherosclerosis (ICD10-I70.0) and Emphysema (ICD10-J43.9). 4. Aortic valvular calcifications. Consider echocardiography to evaluate for valvular dysfunction. Electronically Signed   By: Rockey Kilts M.D.   On: 11/02/2023 12:02    No results found.  No results found.    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 OSA (obstructive sleep apnea) (Primary) The patient doe tolerate PAP and reports  benefit from PAP use. The patient was reminded how to clean equipment and advised to replace supplies routinely. The patient was also counselled on weight loss. The compliance is fair. The AHI is 1.5.   OSA on cpap- controlled. Increase compliance with pap. CPAP continues to be medically necessary to treat this patient's OSA. F/u one year.    2. CPAP use counseling CPAP Counseling: had a lengthy discussion with the patient regarding the importance of PAP therapy in management of the sleep apnea. Patient appears to understand the risk factor reduction and also understands the risks associated with untreated sleep apnea. Patient will try to  make a good faith effort to remain compliant with therapy. Also instructed the patient on proper cleaning of the device including the water  must be changed daily if possible and use of distilled water  is preferred. Patient understands that the machine should be regularly cleaned with appropriate recommended cleaning solutions that do not damage the PAP machine for example given white vinegar and water  rinses. Other methods such as ozone treatment may not be as good as these simple methods to achieve cleaning.   3. Essential hypertension Controlled with lisinopril , continue.      General Counseling: I have discussed the findings of the evaluation and examination with Elspeth.  I have also discussed any further diagnostic evaluation thatmay be needed or ordered today. Zion verbalizes understanding of the findings of todays visit. We also reviewed his medications today and discussed drug interactions and side effects including but not limited excessive drowsiness and altered mental states. We also discussed that there is always a risk not just to him but also people around him. he has been encouraged to call the office with any questions or concerns that should arise related to todays visit.  No orders of the defined types were placed in this encounter.       I have personally obtained a history, examined the patient, evaluated laboratory and imaging results, formulated the assessment and plan and placed orders. This patient was seen today by Lauraine Lay, PA-C in collaboration with Dr. Elfreda Bathe.   Elfreda DELENA Bathe, MD [facility] Diplomate ABMS Pulmonary Critical Care Medicine and Sleep Medicine

## 2024-04-29 ENCOUNTER — Ambulatory Visit (INDEPENDENT_AMBULATORY_CARE_PROVIDER_SITE_OTHER): Admitting: Internal Medicine

## 2024-04-29 VITALS — BP 118/61 | HR 62 | Resp 16 | Ht 71.0 in | Wt 211.0 lb

## 2024-04-29 DIAGNOSIS — Z7189 Other specified counseling: Secondary | ICD-10-CM

## 2024-04-29 DIAGNOSIS — I10 Essential (primary) hypertension: Secondary | ICD-10-CM

## 2024-04-29 DIAGNOSIS — G4733 Obstructive sleep apnea (adult) (pediatric): Secondary | ICD-10-CM

## 2024-04-29 NOTE — Patient Instructions (Signed)

## 2024-06-17 ENCOUNTER — Other Ambulatory Visit: Payer: Self-pay | Admitting: Family

## 2024-06-17 DIAGNOSIS — G8929 Other chronic pain: Secondary | ICD-10-CM

## 2024-06-17 DIAGNOSIS — M48061 Spinal stenosis, lumbar region without neurogenic claudication: Secondary | ICD-10-CM

## 2024-07-24 ENCOUNTER — Encounter: Payer: Self-pay | Admitting: Cardiovascular Disease

## 2024-07-25 ENCOUNTER — Inpatient Hospital Stay
Admission: EM | Admit: 2024-07-25 | Discharge: 2024-08-11 | DRG: 853 | Disposition: E | Attending: Pulmonary Disease | Admitting: Pulmonary Disease

## 2024-07-25 ENCOUNTER — Emergency Department

## 2024-07-25 ENCOUNTER — Other Ambulatory Visit: Payer: Self-pay

## 2024-07-25 DIAGNOSIS — I5023 Acute on chronic systolic (congestive) heart failure: Secondary | ICD-10-CM | POA: Diagnosis present

## 2024-07-25 DIAGNOSIS — I4901 Ventricular fibrillation: Secondary | ICD-10-CM | POA: Diagnosis present

## 2024-07-25 DIAGNOSIS — G9341 Metabolic encephalopathy: Secondary | ICD-10-CM | POA: Diagnosis present

## 2024-07-25 DIAGNOSIS — D62 Acute posthemorrhagic anemia: Secondary | ICD-10-CM | POA: Diagnosis present

## 2024-07-25 DIAGNOSIS — Z7982 Long term (current) use of aspirin: Secondary | ICD-10-CM

## 2024-07-25 DIAGNOSIS — K921 Melena: Secondary | ICD-10-CM | POA: Diagnosis present

## 2024-07-25 DIAGNOSIS — I472 Ventricular tachycardia, unspecified: Secondary | ICD-10-CM | POA: Diagnosis present

## 2024-07-25 DIAGNOSIS — K922 Gastrointestinal hemorrhage, unspecified: Secondary | ICD-10-CM | POA: Diagnosis not present

## 2024-07-25 DIAGNOSIS — I214 Non-ST elevation (NSTEMI) myocardial infarction: Secondary | ICD-10-CM | POA: Diagnosis not present

## 2024-07-25 DIAGNOSIS — I462 Cardiac arrest due to underlying cardiac condition: Secondary | ICD-10-CM | POA: Diagnosis present

## 2024-07-25 DIAGNOSIS — Z7902 Long term (current) use of antithrombotics/antiplatelets: Secondary | ICD-10-CM

## 2024-07-25 DIAGNOSIS — R578 Other shock: Secondary | ICD-10-CM | POA: Diagnosis present

## 2024-07-25 DIAGNOSIS — Z1152 Encounter for screening for COVID-19: Secondary | ICD-10-CM | POA: Diagnosis not present

## 2024-07-25 DIAGNOSIS — E875 Hyperkalemia: Secondary | ICD-10-CM | POA: Diagnosis present

## 2024-07-25 DIAGNOSIS — R570 Cardiogenic shock: Secondary | ICD-10-CM | POA: Diagnosis present

## 2024-07-25 DIAGNOSIS — Z951 Presence of aortocoronary bypass graft: Secondary | ICD-10-CM

## 2024-07-25 DIAGNOSIS — R34 Anuria and oliguria: Secondary | ICD-10-CM | POA: Diagnosis present

## 2024-07-25 DIAGNOSIS — N170 Acute kidney failure with tubular necrosis: Secondary | ICD-10-CM | POA: Diagnosis present

## 2024-07-25 DIAGNOSIS — N179 Acute kidney failure, unspecified: Secondary | ICD-10-CM

## 2024-07-25 DIAGNOSIS — J9602 Acute respiratory failure with hypercapnia: Secondary | ICD-10-CM | POA: Diagnosis present

## 2024-07-25 DIAGNOSIS — J9601 Acute respiratory failure with hypoxia: Secondary | ICD-10-CM | POA: Diagnosis present

## 2024-07-25 DIAGNOSIS — I469 Cardiac arrest, cause unspecified: Secondary | ICD-10-CM | POA: Diagnosis not present

## 2024-07-25 DIAGNOSIS — I2510 Atherosclerotic heart disease of native coronary artery without angina pectoris: Secondary | ICD-10-CM | POA: Diagnosis present

## 2024-07-25 DIAGNOSIS — A419 Sepsis, unspecified organism: Secondary | ICD-10-CM | POA: Diagnosis present

## 2024-07-25 DIAGNOSIS — E872 Acidosis, unspecified: Secondary | ICD-10-CM | POA: Diagnosis present

## 2024-07-25 DIAGNOSIS — I21A1 Myocardial infarction type 2: Secondary | ICD-10-CM | POA: Diagnosis present

## 2024-07-25 DIAGNOSIS — G20B1 Parkinson's disease with dyskinesia, without mention of fluctuations: Secondary | ICD-10-CM | POA: Diagnosis present

## 2024-07-25 DIAGNOSIS — R042 Hemoptysis: Secondary | ICD-10-CM | POA: Diagnosis present

## 2024-07-25 DIAGNOSIS — R001 Bradycardia, unspecified: Secondary | ICD-10-CM | POA: Diagnosis present

## 2024-07-25 DIAGNOSIS — I255 Ischemic cardiomyopathy: Secondary | ICD-10-CM | POA: Diagnosis present

## 2024-07-25 DIAGNOSIS — Z66 Do not resuscitate: Secondary | ICD-10-CM | POA: Diagnosis present

## 2024-07-25 DIAGNOSIS — Z515 Encounter for palliative care: Secondary | ICD-10-CM | POA: Diagnosis not present

## 2024-07-25 DIAGNOSIS — I447 Left bundle-branch block, unspecified: Secondary | ICD-10-CM | POA: Diagnosis present

## 2024-07-25 DIAGNOSIS — Z79899 Other long term (current) drug therapy: Secondary | ICD-10-CM

## 2024-07-25 DIAGNOSIS — R739 Hyperglycemia, unspecified: Secondary | ICD-10-CM | POA: Diagnosis present

## 2024-07-25 DIAGNOSIS — R6521 Severe sepsis with septic shock: Secondary | ICD-10-CM | POA: Diagnosis present

## 2024-07-25 DIAGNOSIS — G253 Myoclonus: Secondary | ICD-10-CM | POA: Diagnosis present

## 2024-07-25 LAB — PROTIME-INR
INR: 1.3 — ABNORMAL HIGH (ref 0.8–1.2)
INR: 1.5 — ABNORMAL HIGH (ref 0.8–1.2)
Prothrombin Time: 16.4 s — ABNORMAL HIGH (ref 11.4–15.2)
Prothrombin Time: 18.9 s — ABNORMAL HIGH (ref 11.4–15.2)

## 2024-07-25 LAB — TECHNOLOGIST SMEAR REVIEW: Plt Morphology: NORMAL

## 2024-07-25 LAB — URINALYSIS, W/ REFLEX TO CULTURE (INFECTION SUSPECTED)
Bacteria, UA: NONE SEEN
Bilirubin Urine: NEGATIVE
Glucose, UA: NEGATIVE mg/dL
Hgb urine dipstick: NEGATIVE
Ketones, ur: NEGATIVE mg/dL
Nitrite: NEGATIVE
Protein, ur: 30 mg/dL — AB
Specific Gravity, Urine: 1.021 (ref 1.005–1.030)
Squamous Epithelial / HPF: 0 /HPF (ref 0–5)
pH: 5 (ref 5.0–8.0)

## 2024-07-25 LAB — TROPONIN T, HIGH SENSITIVITY
Troponin T High Sensitivity: 1309 ng/L (ref 0–19)
Troponin T High Sensitivity: 3616 ng/L (ref 0–19)
Troponin T High Sensitivity: 7123 ng/L (ref 0–19)

## 2024-07-25 LAB — BASIC METABOLIC PANEL WITH GFR
Anion gap: 22 — ABNORMAL HIGH (ref 5–15)
BUN: 70 mg/dL — ABNORMAL HIGH (ref 8–23)
CO2: 16 mmol/L — ABNORMAL LOW (ref 22–32)
Calcium: 8.6 mg/dL — ABNORMAL LOW (ref 8.9–10.3)
Chloride: 104 mmol/L (ref 98–111)
Creatinine, Ser: 2.9 mg/dL — ABNORMAL HIGH (ref 0.61–1.24)
GFR, Estimated: 22 mL/min — ABNORMAL LOW
Glucose, Bld: 376 mg/dL — ABNORMAL HIGH (ref 70–99)
Potassium: 5.3 mmol/L — ABNORMAL HIGH (ref 3.5–5.1)
Sodium: 143 mmol/L (ref 135–145)

## 2024-07-25 LAB — URINE DRUG SCREEN
Amphetamines: NEGATIVE
Barbiturates: NEGATIVE
Benzodiazepines: NEGATIVE
Cocaine: NEGATIVE
Fentanyl: NEGATIVE
Methadone Scn, Ur: NEGATIVE
Opiates: NEGATIVE
Tetrahydrocannabinol: NEGATIVE

## 2024-07-25 LAB — CBC
HCT: 23.7 % — ABNORMAL LOW (ref 39.0–52.0)
HCT: 32.5 % — ABNORMAL LOW (ref 39.0–52.0)
Hemoglobin: 7.4 g/dL — ABNORMAL LOW (ref 13.0–17.0)
Hemoglobin: 10.2 g/dL — ABNORMAL LOW (ref 13.0–17.0)
MCH: 28 pg (ref 26.0–34.0)
MCH: 28.2 pg (ref 26.0–34.0)
MCHC: 31.2 g/dL (ref 30.0–36.0)
MCHC: 31.4 g/dL (ref 30.0–36.0)
MCV: 89.8 fL (ref 80.0–100.0)
MCV: 89.8 fL (ref 80.0–100.0)
Platelets: 224 K/uL (ref 150–400)
Platelets: 259 K/uL (ref 150–400)
RBC: 2.64 MIL/uL — ABNORMAL LOW (ref 4.22–5.81)
RBC: 3.62 MIL/uL — ABNORMAL LOW (ref 4.22–5.81)
RDW: 15.6 % — ABNORMAL HIGH (ref 11.5–15.5)
RDW: 14.9 % (ref 11.5–15.5)
WBC: 11.9 K/uL — ABNORMAL HIGH (ref 4.0–10.5)
WBC: 10.2 K/uL (ref 4.0–10.5)
nRBC: 0 % (ref 0.0–0.2)
nRBC: 0.5 % — ABNORMAL HIGH (ref 0.0–0.2)

## 2024-07-25 LAB — CBC WITH DIFFERENTIAL/PLATELET
Abs Immature Granulocytes: 0.1 K/uL — ABNORMAL HIGH (ref 0.00–0.07)
Basophils Absolute: 0 K/uL (ref 0.0–0.1)
Basophils Relative: 0 %
Eosinophils Absolute: 0 K/uL (ref 0.0–0.5)
Eosinophils Relative: 0 %
HCT: 18.1 % — ABNORMAL LOW (ref 39.0–52.0)
Hemoglobin: 5.4 g/dL — ABNORMAL LOW (ref 13.0–17.0)
Immature Granulocytes: 1 %
Lymphocytes Relative: 5 %
Lymphs Abs: 0.6 K/uL — ABNORMAL LOW (ref 0.7–4.0)
MCH: 27 pg (ref 26.0–34.0)
MCHC: 29.8 g/dL — ABNORMAL LOW (ref 30.0–36.0)
MCV: 90.5 fL (ref 80.0–100.0)
Monocytes Absolute: 0.4 K/uL (ref 0.1–1.0)
Monocytes Relative: 3 %
Neutro Abs: 12.1 K/uL — ABNORMAL HIGH (ref 1.7–7.7)
Neutrophils Relative %: 91 %
Platelets: 228 K/uL (ref 150–400)
RBC: 2 MIL/uL — ABNORMAL LOW (ref 4.22–5.81)
RDW: 15.6 % — ABNORMAL HIGH (ref 11.5–15.5)
WBC: 13.3 K/uL — ABNORMAL HIGH (ref 4.0–10.5)
nRBC: 0 % (ref 0.0–0.2)

## 2024-07-25 LAB — CBG MONITORING, ED
Glucose-Capillary: 126 mg/dL — ABNORMAL HIGH (ref 70–99)
Glucose-Capillary: 129 mg/dL — ABNORMAL HIGH (ref 70–99)
Glucose-Capillary: 175 mg/dL — ABNORMAL HIGH (ref 70–99)
Glucose-Capillary: 199 mg/dL — ABNORMAL HIGH (ref 70–99)

## 2024-07-25 LAB — COMPREHENSIVE METABOLIC PANEL WITH GFR
ALT: 5 U/L (ref 0–44)
AST: 39 U/L (ref 15–41)
Albumin: 4 g/dL (ref 3.5–5.0)
Alkaline Phosphatase: 40 U/L (ref 38–126)
Anion gap: 16 — ABNORMAL HIGH (ref 5–15)
BUN: 70 mg/dL — ABNORMAL HIGH (ref 8–23)
CO2: 17 mmol/L — ABNORMAL LOW (ref 22–32)
Calcium: 9 mg/dL (ref 8.9–10.3)
Chloride: 109 mmol/L (ref 98–111)
Creatinine, Ser: 2.25 mg/dL — ABNORMAL HIGH (ref 0.61–1.24)
GFR, Estimated: 30 mL/min — ABNORMAL LOW
Glucose, Bld: 143 mg/dL — ABNORMAL HIGH (ref 70–99)
Potassium: 5.5 mmol/L — ABNORMAL HIGH (ref 3.5–5.1)
Sodium: 141 mmol/L (ref 135–145)
Total Bilirubin: <0.2 mg/dL (ref 0.0–1.2)
Total Protein: 6 g/dL — ABNORMAL LOW (ref 6.5–8.1)

## 2024-07-25 LAB — BLOOD GAS, ARTERIAL
Acid-base deficit: 22.3 mmol/L — ABNORMAL HIGH (ref 0.0–2.0)
Acid-base deficit: 5.4 mmol/L — ABNORMAL HIGH (ref 0.0–2.0)
Bicarbonate: 10.7 mmol/L — ABNORMAL LOW (ref 20.0–28.0)
Bicarbonate: 20.6 mmol/L (ref 20.0–28.0)
FIO2: 100 %
FIO2: 100 %
MECHVT: 500 mL
MECHVT: 550 mL
Mechanical Rate: 20
Mechanical Rate: 20
O2 Saturation: 78.5 %
O2 Saturation: 93.1 %
PEEP: 5 cmH2O
PEEP: 10 cmH2O
Patient temperature: 37
Patient temperature: 37
RATE: 16 {breaths}/min
Spontaneous VT: 500 mL
pCO2 arterial: 60 mmHg — ABNORMAL HIGH (ref 32–48)
pCO2 arterial: 41 mmHg (ref 32–48)
pH, Arterial: <6.95 — CL (ref 7.35–7.45)
pH, Arterial: 7.31 — ABNORMAL LOW (ref 7.35–7.45)
pO2, Arterial: 61 mmHg — ABNORMAL LOW (ref 83–108)
pO2, Arterial: 64 mmHg — ABNORMAL LOW (ref 83–108)

## 2024-07-25 LAB — APTT: aPTT: 31 s (ref 24–36)

## 2024-07-25 LAB — MRSA NEXT GEN BY PCR, NASAL: MRSA by PCR Next Gen: DETECTED — AB

## 2024-07-25 LAB — DIFFERENTIAL
Abs Immature Granulocytes: 0.2 K/uL — ABNORMAL HIGH (ref 0.00–0.07)
Basophils Absolute: 0 K/uL (ref 0.0–0.1)
Basophils Relative: 0 %
Eosinophils Absolute: 0 K/uL (ref 0.0–0.5)
Eosinophils Relative: 0 %
Immature Granulocytes: 2 %
Lymphocytes Relative: 6 %
Lymphs Abs: 0.7 K/uL (ref 0.7–4.0)
Monocytes Absolute: 0.1 K/uL (ref 0.1–1.0)
Monocytes Relative: 1 %
Neutro Abs: 9.2 K/uL — ABNORMAL HIGH (ref 1.7–7.7)
Neutrophils Relative %: 91 %

## 2024-07-25 LAB — PREPARE RBC (CROSSMATCH)

## 2024-07-25 LAB — ABO/RH: ABO/RH(D): A POS

## 2024-07-25 LAB — LACTIC ACID, PLASMA
Lactic Acid, Venous: 5.2 mmol/L (ref 0.5–1.9)
Lactic Acid, Venous: 6.7 mmol/L (ref 0.5–1.9)
Lactic Acid, Venous: >9 mmol/L (ref 0.5–1.9)
Lactic Acid, Venous: 8.7 mmol/L (ref 0.5–1.9)

## 2024-07-25 LAB — RESP PANEL BY RT-PCR (RSV, FLU A&B, COVID)  RVPGX2
Influenza A by PCR: NEGATIVE
Influenza B by PCR: NEGATIVE
Resp Syncytial Virus by PCR: NEGATIVE
SARS Coronavirus 2 by RT PCR: NEGATIVE

## 2024-07-25 LAB — FIBRINOGEN: Fibrinogen: 258 mg/dL (ref 210–475)

## 2024-07-25 LAB — BETA-HYDROXYBUTYRIC ACID: Beta-Hydroxybutyric Acid: 0.1 mmol/L (ref 0.05–0.27)

## 2024-07-25 LAB — MAGNESIUM: Magnesium: 2.4 mg/dL (ref 1.7–2.4)

## 2024-07-25 MED ORDER — NOREPINEPHRINE 16 MG/250ML-% IV SOLN
0.0000 ug/min | INTRAVENOUS | Status: DC
  Administered 2024-07-25: 31 ug/min via INTRAVENOUS
  Administered 2024-07-26: 29 ug/min via INTRAVENOUS
  Administered 2024-07-26: 30 ug/min via INTRAVENOUS
  Filled 2024-07-25 (×4): qty 250

## 2024-07-25 MED ORDER — AMIODARONE HCL IN DEXTROSE 360-4.14 MG/200ML-% IV SOLN
30.0000 mg/h | INTRAVENOUS | Status: DC
  Administered 2024-07-25 – 2024-07-26 (×2): 30 mg/h via INTRAVENOUS
  Filled 2024-07-25 (×2): qty 200

## 2024-07-25 MED ORDER — SENNA 8.6 MG PO TABS: 1.0000 | ORAL_TABLET | Freq: Two times a day (BID) | ORAL | Status: DC | PRN

## 2024-07-25 MED ORDER — CARBIDOPA-LEVODOPA 25-100 MG PO TABS
2.0000 | ORAL_TABLET | ORAL | Status: DC
  Filled 2024-07-25: qty 2

## 2024-07-25 MED ORDER — FENTANYL 2500MCG IN NS 250ML (10MCG/ML) PREMIX INFUSION
0.0000 ug/h | INTRAVENOUS | Status: DC
  Administered 2024-07-25: 30 ug/h via INTRAVENOUS
  Administered 2024-07-26: 350 ug/h via INTRAVENOUS
  Administered 2024-07-26: 300 ug/h via INTRAVENOUS
  Filled 2024-07-25 (×3): qty 250

## 2024-07-25 MED ORDER — PIPERACILLIN-TAZOBACTAM 3.375 G IVPB
3.3750 g | Freq: Three times a day (TID) | INTRAVENOUS | Status: DC
  Administered 2024-07-25 – 2024-07-26 (×3): 3.375 g via INTRAVENOUS
  Filled 2024-07-25 (×3): qty 50

## 2024-07-25 MED ORDER — POLYETHYLENE GLYCOL 3350 17 G PO PACK
17.0000 g | PACK | Freq: Every day | ORAL | Status: DC
  Administered 2024-07-26: 17 g
  Filled 2024-07-25 (×2): qty 1

## 2024-07-25 MED ORDER — INSULIN ASPART 100 UNIT/ML IJ SOLN
0.0000 [IU] | INTRAMUSCULAR | Status: DC
  Administered 2024-07-25: 2 [IU] via SUBCUTANEOUS
  Filled 2024-07-25: qty 2

## 2024-07-25 MED ORDER — PANTOPRAZOLE SODIUM 40 MG IV SOLR
40.0000 mg | Freq: Two times a day (BID) | INTRAVENOUS | Status: DC
  Administered 2024-07-26: 40 mg via INTRAVENOUS
  Filled 2024-07-25: qty 10

## 2024-07-25 MED ORDER — SODIUM CHLORIDE 0.9 % IV SOLN: Freq: Once | INTRAVENOUS | Status: DC

## 2024-07-25 MED ORDER — VASOPRESSIN 20 UNITS/100 ML INFUSION FOR SHOCK
0.0000 [IU]/min | INTRAVENOUS | Status: DC
  Administered 2024-07-25: 0.03 [IU]/min via INTRAVENOUS
  Administered 2024-07-26: 0.04 [IU]/min via INTRAVENOUS
  Filled 2024-07-25 (×3): qty 100

## 2024-07-25 MED ADMIN — Norepinephrine-Dextrose IV Solution 4 MG/250ML-5%: 8 ug/min | INTRAVENOUS | NDC 70004077240

## 2024-07-25 MED ADMIN — Norepinephrine-Dextrose IV Solution 4 MG/250ML-5%: 30 ug/min | INTRAVENOUS | NDC 00338011220

## 2024-07-25 MED ADMIN — Midazolam HCl Inj PF 2 MG/2ML (Base Equivalent): 2 mg | INTRAVENOUS | NDC 00409000101

## 2024-07-25 MED ADMIN — Midazolam HCl Inj PF 2 MG/2ML (Base Equivalent): 2 mg | INTRAVENOUS | NDC 00409000125

## 2024-07-25 MED ADMIN — Sodium Chloride IV Soln 0.9%: 500 mL | INTRAVENOUS | NDC 00338004903

## 2024-07-25 MED ADMIN — Amiodarone HCl Inj 150 MG/3ML (50 MG/ML): 150 mg | INTRAVENOUS | NDC 67457015303

## 2024-07-25 MED ADMIN — Vancomycin HCl IV Soln 2000 MG/400ML (Base Equivalent): 2000 mg | INTRAVENOUS | NDC 70594004401

## 2024-07-25 MED ADMIN — Epinephrine IV Soln Prefilled Syringe 1 MG/10ML (0.1 MG/ML): 1 mg | INTRAVENOUS | NDC 76329331801

## 2024-07-25 MED ADMIN — Fentanyl Citrate PF Soln Prefilled Syringe 50 MCG/ML: 50 ug | INTRAVENOUS | NDC 63323080801

## 2024-07-25 MED ADMIN — Furosemide Inj 10 MG/ML: 20 mg | INTRAVENOUS | NDC 25021032004

## 2024-07-25 MED ADMIN — Sodium Chloride IV Soln 0.9%: 500 mL | INTRAVENOUS | NDC 00264580210

## 2024-07-25 MED ADMIN — Dopamine in Dextrose 5% Inj 3.2 MG/ML: 10 ug/kg/min | INTRAVENOUS | NDC 00409781022

## 2024-07-25 MED ADMIN — Calcium Chloride Inj 10%: 1 g | INTRAVENOUS | NDC 76329330401

## 2024-07-25 MED ADMIN — Pantoprazole Sodium For IV Soln 40 MG (Base Equiv): 40 mg | INTRAVENOUS | NDC 00008092351

## 2024-07-25 MED ADMIN — Insulin Aspart Inj Soln 100 Unit/ML: 5 [IU] | SUBCUTANEOUS | NDC 73070010011

## 2024-07-25 MED ADMIN — Sodium Bicarbonate IV Soln 8.4%: 50 meq | INTRAVENOUS | NDC 76329335201

## 2024-07-25 MED ADMIN — Dextrose Inj 50%: 50 mL | INTRAVENOUS | NDC 76329330201

## 2024-07-25 MED ADMIN — Pantoprazole Sodium For IV Soln 40 MG (Base Equiv): 80 mg | INTRAVENOUS | NDC 00008092351

## 2024-07-25 MED ADMIN — Amiodarone HCl in Dextrose 4.14% IV Soln 360 MG/200ML: 60 mg/h | INTRAVENOUS | NDC 43066036020

## 2024-07-25 MED ADMIN — Insulin Aspart Inj Soln 100 Unit/ML: 9 [IU] | SUBCUTANEOUS | NDC 73070010011

## 2024-07-25 MED ADMIN — Insulin Aspart Inj Soln 100 Unit/ML: 2 [IU] | SUBCUTANEOUS | NDC 73070010011

## 2024-07-25 MED ADMIN — Epinephrine Soln Prefilled Syringe 0.1 MG/10ML (10 MCG/ML): 5 ug | INTRAVENOUS | NDC 69374054410

## 2024-07-25 MED ADMIN — Piperacillin Sod-Tazobactam Sod in Dex IV Sol 3-0.375GM/50ML: 3.375 g | INTRAVENOUS | NDC 00338963601

## 2024-07-25 MED ADMIN — Atropine Sulfate Soln Prefill Syr 1 MG/10ML (0.1 MG/ML): 1 mg | INTRAVENOUS | NDC 76329334001

## 2024-07-25 MED ADMIN — Sodium Bicarbonate IV Soln 8.4%: 100 meq | INTRAVENOUS | NDC 76329335201

## 2024-07-25 MED ADMIN — Sodium Zirconium Cyclosilicate For Susp Packet 10 GM: 10 g | NDC 00310111001

## 2024-07-25 MED ADMIN — Sodium Chloride IV Soln 0.9%: 1000 mL | INTRAVENOUS | NDC 00338004904

## 2024-07-25 MED ADMIN — Sodium Chloride IV Soln 0.9%: 1000 mL | INTRAVENOUS | NDC 00264580200

## 2024-07-25 MED ADMIN — CEFTRIAXONE 2 G/100 ML IVPB MIXTURE: 2 g | INTRAVENOUS | NDC 00781320990

## 2024-07-25 MED ADMIN — Insulin Aspart Inj Soln 100 Unit/ML: 10 [IU] | INTRAVENOUS | NDC 73070010011

## 2024-07-25 MED FILL — Water For Injection: INTRAMUSCULAR | Qty: 1000 | Status: AC

## 2024-07-25 MED FILL — Sodium Bicarbonate IV Soln 8.4%: INTRAVENOUS | Qty: 150 | Status: AC

## 2024-07-25 MED FILL — Insulin Aspart Inj Soln 100 Unit/ML: 10.0000 [IU] | INTRAMUSCULAR | Qty: 10 | Status: AC

## 2024-07-25 MED FILL — Dextrose Inj 50%: 1.0000 | INTRAVENOUS | Qty: 50 | Status: AC

## 2024-07-25 MED FILL — Midazolam HCl Inj 2 MG/2ML (Base Equivalent): 1.0000 mg | INTRAMUSCULAR | Qty: 2 | Status: AC

## 2024-07-25 MED FILL — Amiodarone HCl in Dextrose 4.14% IV Soln 360 MG/200ML: 60.0000 mg/h | INTRAVENOUS | Qty: 200 | Status: AC

## 2024-07-25 MED FILL — Sodium Zirconium Cyclosilicate For Susp Packet 10 GM: 10.0000 g | ORAL | Qty: 1 | Status: AC

## 2024-07-25 MED FILL — Ceftriaxone Sodium For Inj 2 GM: 2.0000 g | INTRAMUSCULAR | Qty: 20 | Status: AC

## 2024-07-25 MED FILL — Midazolam HCl Inj 2 MG/2ML (Base Equivalent): INTRAMUSCULAR | Qty: 2 | Status: AC

## 2024-07-25 MED FILL — Fentanyl Citrate PF Soln Prefilled Syringe 50 MCG/ML: INTRAMUSCULAR | Qty: 2 | Status: AC

## 2024-07-25 MED FILL — Furosemide Inj 10 MG/ML: 20.0000 mg | INTRAMUSCULAR | Qty: 4 | Status: AC

## 2024-07-25 MED FILL — Pantoprazole Sodium For IV Soln 40 MG (Base Equiv): 40.0000 mg | INTRAVENOUS | Qty: 10 | Status: AC

## 2024-07-25 MED FILL — Piperacillin Sod-Tazobactam Sod in Dex IV Sol 3-0.375GM/50ML: 3.3750 g | INTRAVENOUS | Qty: 50 | Status: AC

## 2024-07-25 MED FILL — Insulin Aspart Inj Soln 100 Unit/ML: 0.0000 [IU] | INTRAMUSCULAR | Qty: 2 | Status: AC

## 2024-07-25 MED FILL — Acetaminophen IV Soln 10 MG/ML: 1000.0000 mg | INTRAVENOUS | Qty: 100 | Status: AC

## 2024-07-25 MED FILL — Chlorhexidine Gluconate Pads 2%: 6.0000 | CUTANEOUS | Qty: 6 | Status: AC

## 2024-07-25 MED FILL — Norepinephrine-Dextrose IV Solution 4 MG/250ML-5%: 0.0000 ug/min | INTRAVENOUS | Qty: 250 | Status: AC

## 2024-07-25 MED FILL — Sennosides Tab 8.6 MG: 1.0000 | ORAL | Qty: 1 | Status: AC

## 2024-07-25 MED FILL — Insulin Aspart Inj Soln 100 Unit/ML: 5.0000 [IU] | INTRAMUSCULAR | Qty: 5 | Status: AC

## 2024-07-25 MED FILL — Pantoprazole Sodium For IV Soln 40 MG (Base Equiv): 80.0000 mg | INTRAVENOUS | Qty: 20 | Status: AC

## 2024-07-25 MED FILL — Vancomycin HCl IV Soln 2000 MG/400ML (Base Equivalent): 2000.0000 mg | INTRAVENOUS | Qty: 400 | Status: AC

## 2024-07-25 MED FILL — Rocuronium Bromide IV Soln Pref Syr 100 MG/10ML (10 MG/ML): INTRAVENOUS | Qty: 10 | Status: CN

## 2024-07-25 NOTE — ED Notes (Signed)
 2nd unit of emergent blood unit finished.

## 2024-07-25 NOTE — Consult Note (Signed)
 PHARMACY CONSULT NOTE - FOLLOW UP  Pharmacy Consult for Electrolyte Monitoring and Replacement   Recent Labs: Potassium (mmol/L)  Date Value  07/25/2024 5.5 (H)   Calcium  (mg/dL)  Date Value  98/84/7973 9.0   Albumin (g/dL)  Date Value  98/84/7973 4.0   Sodium (mmol/L)  Date Value  07/25/2024 141     Assessment: 74 y.o. male with history of Parkinson's disease who is brought to the ED due to generalized weakness, confusion, decreased level of alertness starting yesterday. Potassium elevated. Scr at 2.25, unsure if that is baseline or above baseline.   On dopamine  and NE infusion, fluid boluses were given.   Goal of Therapy:  WNL.   Plan:  No replacement needed at this time.  F/u with AM labs.   Cathaleen GORMAN Blanch ,PharmD Clinical Pharmacist 07/25/2024 4:08 PM

## 2024-07-25 NOTE — ED Notes (Signed)
 MRSA +, PA and NP made aware.

## 2024-07-25 NOTE — Code Documentation (Signed)
 Pt rolled and pads applied

## 2024-07-25 NOTE — Code Documentation (Signed)
 Femoral line being placed by NP.

## 2024-07-25 NOTE — Sepsis Progress Note (Signed)
 eLink is following this Code Sepsis.

## 2024-07-25 NOTE — ED Notes (Addendum)
 Pt wife's states BP normally over 100/50s. Wife states she held lisinopril this morning.

## 2024-07-25 NOTE — ED Notes (Signed)
CBG 199 

## 2024-07-25 NOTE — ED Provider Notes (Signed)
 "  [facility] Provider Note    Event Date/Time   First MD Initiated Contact with Patient 07/25/24 1108     (approximate)   History   Chief Complaint: Code Sepsis   HPI  [name] [name] is a 74 y.o. male with history of Parkinson's disease who is brought to the ED due to generalized weakness, confusion, decreased level of alertness starting yesterday.  Spouse reports usually this happens when he has a UTI.  No fever.  Has had issues with anemia in the past, takes an iron supplement and has chronically black stool.        No past medical history on file.  Current Outpatient Rx   Order #: [id number] Class: Historical Med   Order #: [id number] Class: Historical Med   Order #: [id number] Class: Historical Med   Order #: [id number] Class: Historical Med   Order #: [id number] Class: Historical Med   Order #: [id number] Class: Historical Med   Order #: [id number] Class: Historical Med      Physical Exam   Triage Vital Signs: ED Triage Vitals  Encounter Vitals Group     BP 07/25/24 1106 (!) 80/50     Girls Systolic BP Percentile --      Girls Diastolic BP Percentile --      Boys Systolic BP Percentile --      Boys Diastolic BP Percentile --      Pulse Rate 07/25/24 1105 99     Resp 07/25/24 1105 (!) 21     Temp 07/25/24 1059 98.2 F (36.8 C)     Temp Source 07/25/24 1059 Oral     SpO2 07/25/24 1105 100 %     Weight --      Height --      Head Circumference --      Peak Flow --      Pain Score --      Pain Loc --      Pain Education --      Exclude from Growth Chart --     Most recent vital signs: Vitals:   07/25/24 1548 07/25/24 1549  BP: (!) 82/42   Pulse:  (!) 41  Resp: (!) 22 20  Temp:    SpO2:  (!) 70%    General: Awake, ill-appearing CV:  Good peripheral perfusion.  Tachycardia heart rate 100 Resp:  Normal effort.  Mild tachypnea, respiratory rate 22.  Clear lungs. Abd:  No distention.  Soft nontender.  Melanotic stool, strongly Hemoccult  positive Other:  Dry oral mucosa.  Conjunctival pallor.   ED Results / Procedures / Treatments   Labs (all labs ordered are listed, but only abnormal results are displayed) Labs Reviewed  LACTIC ACID, PLASMA - Abnormal; Notable for the following components:      Result Value   Lactic Acid, Venous 5.2 (*)    All other components within normal limits  LACTIC ACID, PLASMA - Abnormal; Notable for the following components:   Lactic Acid, Venous 6.7 (*)    All other components within normal limits  COMPREHENSIVE METABOLIC PANEL WITH GFR - Abnormal; Notable for the following components:   Potassium 5.5 (*)    CO2 17 (*)    Glucose, Bld 143 (*)    BUN 70 (*)    Creatinine, Ser 2.25 (*)    Total Protein 6.0 (*)    GFR, Estimated 30 (*)    Anion gap 16 (*)    All other components within normal limits  CBC WITH DIFFERENTIAL/PLATELET -  Abnormal; Notable for the following components:   WBC 13.3 (*)    RBC 2.00 (*)    Hemoglobin 5.4 (*)    HCT 18.1 (*)    MCHC 29.8 (*)    RDW 15.6 (*)    Neutro Abs 12.1 (*)    Lymphs Abs 0.6 (*)    Abs Immature Granulocytes 0.10 (*)    All other components within normal limits  PROTIME-INR - Abnormal; Notable for the following components:   Prothrombin Time 16.4 (*)    INR 1.3 (*)    All other components within normal limits  URINALYSIS, W/ REFLEX TO CULTURE (INFECTION SUSPECTED) - Abnormal; Notable for the following components:   Color, Urine YELLOW (*)    APPearance CLOUDY (*)    Protein, ur 30 (*)    Leukocytes,Ua TRACE (*)    All other components within normal limits  CBG MONITORING, ED - Abnormal; Notable for the following components:   Glucose-Capillary 126 (*)    All other components within normal limits  CBG MONITORING, ED - Abnormal; Notable for the following components:   Glucose-Capillary 199 (*)    All other components within normal limits  RESP PANEL BY RT-PCR (RSV, FLU A&B, COVID)  RVPGX2  CULTURE, BLOOD (ROUTINE X 2)  CULTURE,  BLOOD (ROUTINE X 2)  CBC  TYPE AND SCREEN  PREPARE RBC (CROSSMATCH)  ABO/RH     EKG Interpreted by me Atrial fibrillation, rate of 110.  Normal axis, normal intervals.  Poor R wave progression, left bundle branch block.  No acute ischemic changes.   RADIOLOGY Chest x-ray interpreted by me, unremarkable.  Radiology report reviewed   PROCEDURES:  .Critical Care  Performed by: Viviann Pastor, MD Authorized by: Viviann Pastor, MD   Critical care provider statement:    Critical care time (minutes):  60   Critical care time was exclusive of:  Separately billable procedures and treating other patients   Critical care was necessary to treat or prevent imminent or life-threatening deterioration of the following conditions:  Shock   Critical care was time spent personally by me on the following activities:  Development of treatment plan with patient or surrogate, discussions with consultants, evaluation of patient's response to treatment, examination of patient, obtaining history from patient or surrogate, ordering and performing treatments and interventions, ordering and review of laboratory studies, ordering and review of radiographic studies, pulse oximetry, re-evaluation of patient's condition and review of old charts   Care discussed with: admitting provider   Comments:        Procedure Name: Intubation Date/Time: 07/25/2024 3:49 PM  Performed by: Viviann Pastor, MDPre-anesthesia Checklist: Patient identified, Patient being monitored, Emergency Drugs available, Timeout performed and Suction available Oxygen Delivery Method: Non-rebreather mask Preoxygenation: Pre-oxygenation with 100% oxygen Induction Type: Rapid sequence Ventilation: Mask ventilation without difficulty Laryngoscope Size: Glidescope and 3 Grade View: Grade I Tube size: 8.0 mm Number of attempts: 1 Airway Equipment and Method: Video-laryngoscopy Placement Confirmation: ETT inserted through vocal cords  under direct vision, CO2 detector and Breath sounds checked- equal and bilateral Secured at: 23 cm Tube secured with: ETT holder Dental Injury: Teeth and Oropharynx as per pre-operative assessment  Comments:       CPR  Date/Time: 07/25/2024 3:49 PM  Performed by: Viviann Pastor, MD Authorized by: Viviann Pastor, MD  CPR Procedure Details:      Amount of time prior to administration of ACLS/BLS (minutes):  1   ACLS/BLS initiated by EMS: No     CPR/ACLS  performed in the ED: Yes     Duration of CPR (minutes):  20   Outcome: ROSC obtained    CPR performed via ACLS guidelines under my direct supervision.  See RN documentation for details including defibrillator use, medications, doses and timing.    MEDICATIONS ORDERED IN ED: Medications  0.9 %  sodium chloride  infusion (0 mLs Intravenous Hold 07/25/24 1357)  carbidopa -levodopa  (SINEMET  IR) 25-100 MG per tablet immediate release 2 tablet (0 tablets Oral Hold 07/25/24 1411)  DOPamine  (INTROPIN ) 800 mg in dextrose  5 % 250 mL (3.2 mg/mL) infusion (15 mcg/kg/min  96.7 kg Intravenous Rate/Dose Change 07/25/24 1543)  norepinephrine  (LEVOPHED ) 4mg  in (0.016 mg/mL) premix infusion (10 mcg/min Intravenous Rate/Dose Change 07/25/24 1546)  calcium  chloride injection (1 g Intravenous Given 07/25/24 1546)  sodium bicarbonate  injection (50 mEq Intravenous Given 07/25/24 1547)  sodium chloride  0.9 % bolus 1,000 mL (0 mLs Intravenous Stopped 07/25/24 1225)    And  sodium chloride  0.9 % bolus 1,000 mL (0 mLs Intravenous Stopped 07/25/24 1349)  cefTRIAXone  (ROCEPHIN ) 2 g in sodium chloride  0.9 % 100 mL IVPB (0 g Intravenous Stopped 07/25/24 1147)  pantoprazole  (PROTONIX ) injection 40 mg (40 mg Intravenous Given 07/25/24 1221)  sodium chloride  0.9 % bolus 500 mL (500 mLs Intravenous New Bag/Given 07/25/24 1451)  EPINEPHrine  (ADRENALIN ) 1 MG/10ML injection (1 mg Intravenous Given 07/25/24 1506)  amiodarone  (CORDARONE ) injection (150 mg Intravenous  Given 07/25/24 1458)  atropine  1 MG/10ML injection (1 mg Intravenous Given 07/25/24 1500)  DOPamine  (INTROPIN ) 800 mg in dextrose  5 % 250 mL (3.2 mg/mL) infusion (0 mcg/kg/min  96.7 kg Intravenous Stopped 07/25/24 1542)  EPINEPHrine  (ADRENALIN ) 1 MG/10ML injection (1 mg Intravenous Given 07/25/24 1510)  EPINEPHrine  (ADRENALIN ) 1 MG/10ML injection (1 mg Intravenous Given 07/25/24 1523)  calcium  chloride injection (1 g Intravenous Given 07/25/24 1524)  sodium bicarbonate  injection (50 mEq Intravenous Given 07/25/24 1525)     IMPRESSION / MDM / ASSESSMENT AND PLAN / ED COURSE  I reviewed the triage vital signs and the nursing notes.  DDx: Upper GI bleed, hemorrhagic shock, dehydration, UTI, sepsis  Patient's presentation is most consistent with acute presentation with potential threat to life or bodily function.  Patient presents with hypotension, confusion and generalized weakness.  Fluid bolus and sepsis workup initiated.  Exam concerning for upper GI bleed.   Clinical Course as of 07/25/24 1550  Thu Jul 25, 2024  1204 Hemoglobin 5.  Rectal exam reveals melena.  Spouse has arrived to bedside, notes black stool chronically due to iron supplement use.  Not on blood thinners but is on aspirin and Plavix.  Will start transfusion. [PS]  1438 After 2 units RBC, blood pressure remains 90/50.  Improving but still borderline.  Will give additional IV fluids, plan to admit.  Recheck CBC. [PS]    Clinical Course User Index [PS] Viviann Pastor, MD     FINAL CLINICAL IMPRESSION(S) / ED DIAGNOSES   Final diagnoses:  Upper GI bleed  Hemorrhagic shock (HCC)  Parkinson's disease with dyskinesia, unspecified whether manifestations fluctuate (HCC)  AKI (acute kidney injury)     Rx / DC Orders   ED Discharge Orders     None        Note:  This document was prepared using Dragon voice recognition software and may include unintentional dictation errors.   Viviann Pastor, MD 07/25/24  1550  "

## 2024-07-25 NOTE — ED Notes (Signed)
 Lab called for COVID swabs

## 2024-07-25 NOTE — Consult Note (Signed)
 "      Rogelia Copping, MD [facility]  [address] [place], [place] [address] Phone: [phone] Fax : [phone]  Consultation  Referring Provider:     Dr. Viviann Primary Care Physician:  Pcp, No Primary Gastroenterologist: Sampson         Reason for Consultation:     Melena  Date of Admission:  07/25/2024 Date of Consultation:  07/25/2024         HPI:   [name] [name] is a 74 y.o. male who is on Plavix and aspirin and has a history of Parkinson disease and was brought to the emergency department for generalized weakness and confusion.  The patient's wife reported that she thought he may have a UTI since this happens when he has a UTI.  The patient was found to have anemia and his blood work showed hemoglobin of 5.4 with a white cell count of 13.3.  The patient also was found to have a lactic acidosis.  The patient's INR was also prolonged at 1.3. The patient was receiving blood transfusions and appears to have had a acute event in the emergency department with what appeared to be aspiration and arrest with V. tach resulting in the patient being intubated.  The patient is now intubated and not responsive.  The patient also received cardioversion during his arrest. I am told that the patient has a history of anemia and is on chronic iron with phonic black stools.  On admission today his stools were black soft and were reported to be heme positive.   No past medical history on file.    Prior to Admission medications  Medication Sig Start Date End Date Taking? Authorizing Provider  carbidopa -levodopa  (SINEMET  IR) 25-100 MG tablet Take 2 tablets by mouth 4 (four) times daily. 06/28/24  Yes [provider]  clopidogrel (PLAVIX) 75 MG tablet Take 75 mg by mouth daily. 06/10/24  Yes [provider]  ezetimibe (ZETIA) 10 MG tablet Take 10 mg by mouth daily. 06/10/24  Yes [provider]  lidocaine (LIDODERM) 5 % Place 1 patch onto the skin daily. 05/19/24  Yes  [provider]  lisinopril (ZESTRIL) 20 MG tablet Take 20 mg by mouth daily. 07/15/24  Yes [provider]  MYRBETRIQ 50 MG TB24 tablet Take 50 mg by mouth daily. 06/23/24  Yes [provider]  rosuvastatin (CRESTOR) 20 MG tablet Take 20 mg by mouth daily. 07/15/24  Yes [provider]    No family history on file.   Social History[1]  Allergies as of 07/25/2024   (Not on File)    Review of Systems:    All systems reviewed and negative except where noted in HPI.   Physical Exam:  Vital signs in last 24 hours: Temp:  [97.2 F (36.2 C)-98.2 F (36.8 C)] 97.6 F (36.4 C) (01/15 1417) Pulse Rate:  [41-105] 41 (01/15 1552) Resp:  [17-29] 21 (01/15 1552) BP: (66-203)/(28-167) 80/41 (01/15 1552) SpO2:  [51 %-100 %] 70 % (01/15 1549) FiO2 (%):  [100 %] 100 % (01/15 1515) Weight:  [96.7 kg] 96.7 kg (01/15 1118)   General: Patient intubated and unresponsive Head:  Normocephalic and atraumatic. Eyes:   No icterus.   Conjunctiva pink. PERRLA. Ears: Unable to assess. Neck: ET tube in place Rectal:  Not performed. Msk:  Symmetrical without gross deformities.    Extremities:  Without edema, cyanosis or clubbing. Neurologic: Unable to assess Skin:  Intact without significant lesions or rashes. Psych: Intubated and  unresponsive  LAB RESULTS: Recent Labs    07/25/24 1115  WBC 13.3*  HGB 5.4*  HCT 18.1*  PLT 228   BMET Recent Labs    07/25/24 1115  NA 141  K 5.5*  CL 109  CO2 17*  GLUCOSE 143*  BUN 70*  CREATININE 2.25*  CALCIUM  9.0   LFT Recent Labs    07/25/24 1115  PROT 6.0*  ALBUMIN 4.0  AST 39  ALT 5  ALKPHOS 40  BILITOT <0.2   PT/INR Recent Labs    07/25/24 1115  LABPROT 16.4*  INR 1.3*    STUDIES: DG Chest Port 1 View Result Date: 07/25/2024 CLINICAL DATA:  Possible sepsis. EXAM: PORTABLE CHEST 1 VIEW COMPARISON:  04/28/2014 FINDINGS: Lungs are somewhat hypoinflated demonstrate mild hazy perihilar vessels and  subtle Kerley B-lines suggesting mild degree of vascular congestion. No lobar consolidation or effusion. Cardiomediastinal silhouette and remainder of the exam is unchanged. IMPRESSION: Suggestion of mild vascular congestion. Electronically Signed   By: Toribio Agreste M.D.   On: 07/25/2024 11:51      Impression / Plan:   Assessment: Principal Problem:   Acute GI bleeding   [name] [name] is a 74 y.o. y/o male with a presumed upper GI bleed with dark stools and the patient being on aspirin and Plavix.  The patient decompensated in the emergency department with a presumed aspiration resulting in an arrhythmia with cardioversion and intubation.  The patient has received multiple units of blood.  Plan:  The patient will need to be stabilized prior to any GI intervention at this time.  The patient is also on Plavix which ideally would be held 5 days prior to any endoscopic intervention.  I am not planning any urgent procedures on this patient until he has been stabilized.  PPI IV twice daily  Continue serial CBCs and transfuse PRN Avoid NSAIDs Maintain 2 large-bore IV lines Please page GI with any acute hemodynamic changes, or signs of active GI bleeding   Thank you for involving me in the care of this patient.      LOS: 0 days   Rogelia Copping, MD, MD. NOLIA 07/25/2024, 3:54 PM,  Pager [phone] 7am-5pm  Check AMION for 5pm -7am coverage and on weekends   Note: This dictation was prepared with Dragon dictation along with smaller phrase technology. Any transcriptional errors that result from this process are unintentional.       [1]    "

## 2024-07-25 NOTE — ED Notes (Signed)
 Pt having multiple runs of Vtach while provider at bedside.

## 2024-07-25 NOTE — ED Notes (Signed)
 [facility] is their facility

## 2024-07-25 NOTE — IPAL (Signed)
" °  Interdisciplinary Goals of Care Family Meeting   Date carried out: 07/25/2024  Location of the meeting: Conference room  Member's involved: Family Member or next of kin and Other: Audiological Scientist or environmental health practitioner: Wife    Discussion: We discussed goals of care for [employer] .  I discussed with the wife, son and daughter in law the overall status of Mr. Uzelac. It was communicated he is very critically ill requiring multiple interventions. Given his chronic medical history, it was voiced per the family they would not like to put his body through additional stress with CPR if he cardiac arrest. It was agreed amongst the family to change his code status to DNR/DNI. If his clinical status continues to worsen despite aggressive medical care, a further goals of care conversation will be had. All questions answered during this meeting.  Code status:   Code Status: Limited: Do not attempt resuscitation (DNR) -DNR-LIMITED -Do Not Intubate/DNI    Disposition: Continue current acute care  Time spent for the meeting: 45 minutes    Mariane Burpee, PA-C  07/25/2024, 11:47 PM   "

## 2024-07-25 NOTE — ED Notes (Addendum)
 Pt had 2-3 sec of VT, converted to NSR, converted back to VT. Pulse felt.

## 2024-07-25 NOTE — Sepsis Progress Note (Signed)
 Notified provider of need to order fluid bolus to reach weight based goal.  Notified provider of need to order repeat lactic acid as the second is higher than the first.

## 2024-07-25 NOTE — Progress Notes (Signed)
 CODE SEPSIS - PHARMACY COMMUNICATION  **Broad Spectrum Antibiotics should be administered within 1 hour of Sepsis diagnosis**  Time Code Sepsis Called/Page Received: 1115  Antibiotics Ordered: ceftriaxone   Time of 1st antibiotic administration: 1123  Additional action taken by pharmacy: none taken  If necessary, Name of Provider/Nurse Contacted: none contacted, not necessary    [name] [name], PharmD Pharmacy Resident  07/25/2024 11:25 AM

## 2024-07-25 NOTE — Procedures (Signed)
 Central Venous Catheter Insertion Procedure Note  [name] [name]  [id number]  30-Mar-1951  Date:07/25/24  Time:3:58 PM   Provider Performing:Mose Colaizzi D Shellia   Procedure: Insertion of Non-tunneled Central Venous Catheter(36556) with US  guidance (23062)   Indication(s) Medication administration and Difficult access  Consent Unable to obtain consent due to emergent nature of procedure.  Anesthesia Topical only with 1% lidocaine   Timeout Verified patient identification, verified procedure, site/side was marked, verified correct patient position, special equipment/implants available, medications/allergies/relevant history reviewed, required imaging and test results available.  Sterile Technique Maximal sterile technique including full sterile barrier drape, hand hygiene, sterile gown, sterile gloves, mask, hair covering, sterile ultrasound probe cover (if used).  Procedure Description Area of catheter insertion was cleaned with chlorhexidine  and draped in sterile fashion.  With real-time ultrasound guidance a central venous catheter was placed into the right femoral vein. Nonpulsatile blood flow and easy flushing noted in all ports.  The catheter was sutured in place and sterile dressing applied.  Complications/Tolerance None; patient tolerated the procedure well. Chest X-ray is ordered to verify placement for internal jugular or subclavian cannulation.   Chest x-ray is not ordered for femoral cannulation.  EBL Minimal  Specimen(s) None    Line inserted to the 20 cm mark   Inge Shellia, AGACNP-BC [facility] Prefer epic messenger for cross cover needs If after hours, please call E-link .

## 2024-07-25 NOTE — Code Documentation (Signed)
 Pt breathing on own, in VT

## 2024-07-25 NOTE — ED Notes (Signed)
 2nd unit of emergent blood transfusion completed.

## 2024-07-25 NOTE — Code Documentation (Signed)
Shock delivered  

## 2024-07-25 NOTE — ED Triage Notes (Signed)
 Pt comes from an [facility]. Lives w/ wife who reports increased delirium, decreased mobility and appetite. Wife suspected UTI. Pt has hx of Parkinson's which has caused delirium for him in the past.  EMS vitals: HR 100 BP: 64/36 100% on RA 97.2 axillary temp 240 CBG 2x attempts at IV by EMS w/o success

## 2024-07-25 NOTE — Code Documentation (Signed)
 1st unit of emergent blood finished. Second set being hung per MD verbal order

## 2024-07-26 ENCOUNTER — Inpatient Hospital Stay

## 2024-07-26 ENCOUNTER — Other Ambulatory Visit: Payer: Self-pay | Admitting: Emergency Medicine

## 2024-07-26 DIAGNOSIS — N170 Acute kidney failure with tubular necrosis: Secondary | ICD-10-CM

## 2024-07-26 DIAGNOSIS — I4901 Ventricular fibrillation: Secondary | ICD-10-CM | POA: Diagnosis not present

## 2024-07-26 DIAGNOSIS — Z951 Presence of aortocoronary bypass graft: Secondary | ICD-10-CM | POA: Diagnosis not present

## 2024-07-26 DIAGNOSIS — I214 Non-ST elevation (NSTEMI) myocardial infarction: Secondary | ICD-10-CM

## 2024-07-26 DIAGNOSIS — R578 Other shock: Secondary | ICD-10-CM

## 2024-07-26 DIAGNOSIS — I469 Cardiac arrest, cause unspecified: Secondary | ICD-10-CM | POA: Diagnosis not present

## 2024-07-26 DIAGNOSIS — I2510 Atherosclerotic heart disease of native coronary artery without angina pectoris: Secondary | ICD-10-CM

## 2024-07-26 DIAGNOSIS — K922 Gastrointestinal hemorrhage, unspecified: Secondary | ICD-10-CM | POA: Diagnosis not present

## 2024-07-26 LAB — GLUCOSE, CAPILLARY
Glucose-Capillary: 113 mg/dL — ABNORMAL HIGH (ref 70–99)
Glucose-Capillary: 91 mg/dL (ref 70–99)
Glucose-Capillary: 71 mg/dL (ref 70–99)
Glucose-Capillary: 30 mg/dL — CL (ref 70–99)
Glucose-Capillary: 29 mg/dL — CL (ref 70–99)
Glucose-Capillary: 34 mg/dL — CL (ref 70–99)
Glucose-Capillary: 80 mg/dL (ref 70–99)
Glucose-Capillary: 169 mg/dL — ABNORMAL HIGH (ref 70–99)
Glucose-Capillary: 168 mg/dL — ABNORMAL HIGH (ref 70–99)

## 2024-07-26 LAB — COMPREHENSIVE METABOLIC PANEL WITH GFR
ALT: 61 U/L — ABNORMAL HIGH (ref 0–44)
AST: 681 U/L — ABNORMAL HIGH (ref 15–41)
Albumin: 3.3 g/dL — ABNORMAL LOW (ref 3.5–5.0)
Alkaline Phosphatase: 48 U/L (ref 38–126)
Anion gap: 14 (ref 5–15)
BUN: 76 mg/dL — ABNORMAL HIGH (ref 8–23)
CO2: 25 mmol/L (ref 22–32)
Calcium: 8 mg/dL — ABNORMAL LOW (ref 8.9–10.3)
Chloride: 103 mmol/L (ref 98–111)
Creatinine, Ser: 3.79 mg/dL — ABNORMAL HIGH (ref 0.61–1.24)
GFR, Estimated: 16 mL/min — ABNORMAL LOW
Glucose, Bld: 108 mg/dL — ABNORMAL HIGH (ref 70–99)
Potassium: 5.2 mmol/L — ABNORMAL HIGH (ref 3.5–5.1)
Sodium: 142 mmol/L (ref 135–145)
Total Bilirubin: 0.3 mg/dL (ref 0.0–1.2)
Total Protein: 5.2 g/dL — ABNORMAL LOW (ref 6.5–8.1)

## 2024-07-26 LAB — BLOOD GAS, VENOUS
Acid-base deficit: 2.8 mmol/L — ABNORMAL HIGH (ref 0.0–2.0)
Acid-base deficit: 0.3 mmol/L (ref 0.0–2.0)
Bicarbonate: 25.7 mmol/L (ref 20.0–28.0)
Bicarbonate: 27.7 mmol/L (ref 20.0–28.0)
FIO2: 90 %
FIO2: 80 %
MECHVT: 550 mL
MECHVT: 500 mL
Mechanical Rate: 24
O2 Saturation: 65.5 %
O2 Saturation: 57.1 %
PEEP: 8 cmH2O
PEEP: 5 cmH2O
Patient temperature: 37
Patient temperature: 37
RATE: 20 {breaths}/min
pCO2, Ven: 60 mmHg (ref 44–60)
pCO2, Ven: 59 mmHg (ref 44–60)
pH, Ven: 7.24 — ABNORMAL LOW (ref 7.25–7.43)
pH, Ven: 7.28 (ref 7.25–7.43)
pO2, Ven: 40 mmHg (ref 32–45)
pO2, Ven: 36 mmHg (ref 32–45)

## 2024-07-26 LAB — HEMOGLOBIN A1C
Hgb A1c MFr Bld: 5.9 % — ABNORMAL HIGH (ref 4.8–5.6)
Mean Plasma Glucose: 122.63 mg/dL

## 2024-07-26 LAB — THYROID PANEL WITH TSH
Free Thyroxine Index: 1.5 (ref 1.2–4.9)
T3 Uptake Ratio: 38 % (ref 24–39)
T4, Total: 3.9 ug/dL — ABNORMAL LOW (ref 4.5–12.0)
TSH: 1.81 u[IU]/mL (ref 0.450–4.500)

## 2024-07-26 LAB — CBC
HCT: 32.7 % — ABNORMAL LOW (ref 39.0–52.0)
Hemoglobin: 10.8 g/dL — ABNORMAL LOW (ref 13.0–17.0)
MCH: 28.6 pg (ref 26.0–34.0)
MCHC: 33 g/dL (ref 30.0–36.0)
MCV: 86.5 fL (ref 80.0–100.0)
Platelets: 241 K/uL (ref 150–400)
RBC: 3.78 MIL/uL — ABNORMAL LOW (ref 4.22–5.81)
RDW: 15.4 % (ref 11.5–15.5)
WBC: 28.3 K/uL — ABNORMAL HIGH (ref 4.0–10.5)
nRBC: 0.2 % (ref 0.0–0.2)

## 2024-07-26 LAB — ECHOCARDIOGRAM COMPLETE
AR max vel: 1.56 cm2
AV Area VTI: 1.61 cm2
AV Area mean vel: 1.49 cm2
AV Mean grad: 10 mmHg
AV Peak grad: 18 mmHg
Ao pk vel: 2.12 m/s
Area-P 1/2: 4.8 cm2
Height: 72 in
MV VTI: 1.59 cm2
P 1/2 time: 307 ms
S' Lateral: 4.6 cm
Weight: 3495.61 [oz_av]

## 2024-07-26 LAB — MAGNESIUM: Magnesium: 2.1 mg/dL (ref 1.7–2.4)

## 2024-07-26 LAB — PROTIME-INR
INR: 1.4 — ABNORMAL HIGH (ref 0.8–1.2)
Prothrombin Time: 18.3 s — ABNORMAL HIGH (ref 11.4–15.2)

## 2024-07-26 LAB — PREPARE FRESH FROZEN PLASMA

## 2024-07-26 LAB — TROPONIN T, HIGH SENSITIVITY: Troponin T High Sensitivity: >10000 ng/L (ref 0–19)

## 2024-07-26 LAB — BPAM PLATELET PHERESIS
Blood Product Expiration Date: 202601162359
ISSUE DATE / TIME: 202601151730
Unit Type and Rh: 6200

## 2024-07-26 LAB — PHOSPHORUS: Phosphorus: 6.8 mg/dL — ABNORMAL HIGH (ref 2.5–4.6)

## 2024-07-26 LAB — PREPARE PLATELET PHERESIS: Unit division: 0

## 2024-07-26 LAB — BPAM FFP
Blood Product Expiration Date: 202601202359
ISSUE DATE / TIME: 202601151730
Unit Type and Rh: 6200

## 2024-07-26 MED ORDER — LISINOPRIL 10 MG PO TABS: 10.0000 mg | ORAL_TABLET | Freq: Every day | ORAL | 3 refills | Status: AC

## 2024-07-26 MED ADMIN — Perflutren Lipid Microsphere IV Susp 1.1 MG/ML: 3 mL | INTRAVENOUS | NDC 99999100031

## 2024-07-26 MED ADMIN — Midazolam HCl Inj PF 2 MG/2ML (Base Equivalent): 2 mg | INTRAVENOUS | NDC 00409000101

## 2024-07-26 MED ADMIN — Ketamine HCl-NaCl IV Soln 1000 MG/100ML-0.69%: 0.5 mg/kg/h | INTRAVENOUS | NDC 71266908101

## 2024-07-26 MED ADMIN — Sennosides Tab 8.6 MG: 8.6 mg | NDC 00904725261

## 2024-07-26 MED ADMIN — Dextrose Inj 50%: 25 g | INTRAVENOUS | NDC 76329330201

## 2024-07-26 MED ADMIN — Dextrose Inj 50%: 50 mL | INTRAVENOUS | NDC 76329330201

## 2024-07-26 MED ADMIN — Furosemide Inj 10 MG/ML: 40 mg | INTRAVENOUS | NDC 71288020304

## 2024-07-26 MED ADMIN — Hydrocortisone Sodium Succinate PF For Inj 100 MG: 100 mg | INTRAVENOUS | NDC 00009001103

## 2024-07-26 MED ADMIN — Acetaminophen IV Soln 10 MG/ML: 1000 mg | INTRAVENOUS | NDC 63323043441

## 2024-07-26 MED FILL — Dextrose Inj 50%: 1.0000 | INTRAVENOUS | Qty: 50 | Status: AC

## 2024-07-26 MED FILL — Ketamine HCl-NaCl IV Soln 1000 MG/100ML-0.69%: 0.5000 mg/kg/h | INTRAVENOUS | Qty: 100 | Status: AC

## 2024-07-26 MED FILL — Hydrocortisone Sodium Succinate PF For Inj 100 MG: 100.0000 mg | INTRAMUSCULAR | Qty: 2 | Status: AC

## 2024-07-26 MED FILL — Dextrose Inj 50%: INTRAVENOUS | Qty: 50 | Status: AC

## 2024-07-26 MED FILL — Furosemide Inj 10 MG/ML: 40.0000 mg | INTRAMUSCULAR | Qty: 4 | Status: AC

## 2024-07-29 LAB — BPAM RBC
Blood Product Expiration Date: 202602072359
Blood Product Expiration Date: 202602092359
Blood Product Expiration Date: 202602132359
Blood Product Expiration Date: 202602152359
Blood Product Expiration Date: 202602152359
Blood Product Expiration Date: 202602152359
Blood Product Expiration Date: 202602152359
Blood Product Expiration Date: 202602152359
ISSUE DATE / TIME: 202601151223
ISSUE DATE / TIME: 202601151223
ISSUE DATE / TIME: 202601151501
ISSUE DATE / TIME: 202601151501
Unit Type and Rh: 5100
Unit Type and Rh: 5100
Unit Type and Rh: 6200
Unit Type and Rh: 6200
Unit Type and Rh: 6200
Unit Type and Rh: 6200
Unit Type and Rh: 6200
Unit Type and Rh: 6200

## 2024-07-29 LAB — TYPE AND SCREEN
ABO/RH(D): A POS
Antibody Screen: NEGATIVE
Unit division: 0
Unit division: 0
Unit division: 0
Unit division: 0
Unit division: 0
Unit division: 0
Unit division: 0
Unit division: 0

## 2024-08-11 NOTE — IPAL (Signed)
 GOALS OF CARE FAMILY CONFERENCE   Current clinical status, hospital findings and medical plan was reviewed with family.   Updated and notified of patients ongoing immediate critical medical problems.   I spoke with son and wife of patient. They understand his cardiac arrest and acute hypoxemia and critical ill status.  Wife tells me his parkinsons has advanced and his baseline has been poor.  Patietn has already expressed not wanting aggressive therapy including HD.  Nephrologist met with patient family declined HD. They understand his pulm edema and AKI will worsen and wish to proceed with comfort measures.   Patient remains unresponsive acutely comatose    Patient is unable to breathe independently, unable to protect airway and unable to mobilize secretions.    Explained to family course of therapy and the modalities    Family is appreciative of care and relate understanding that patient is severely critically ill with anticipation of passing away during this hospitalization.   They have consented and agreed to DNR/DNI  comfort care Code status   Family are satisfied with Plan of action and management. All questions answered  Additional Critical Care time 35 mins    Halina Picking, M.D.  Pulmonary & Critical Care Medicine  [facility]

## 2024-08-11 NOTE — Consult Note (Signed)
 " Cardiology Consultation:  Patient ID: [name] [name] MRN: [id number]; DOB: June 16, 1951  Admit date: 07/25/2024 Date of Consult: 07/26/2024  Primary Care Provider: Pcp, No Primary Cardiologist: None Dr. Perla Primary Electrophysiologist:  None   Patient Profile:  [name] [name] is a 74 y.o. male with a hx of CAD status post CABG 03/2014, ischemic cardiomyopathy, Parkinson disease, who is being seen today for the evaluation of NSTEMI at the request of Dr. Parris.  History of Present Illness:  [name] initially presented with generalized weakness and altered mental status.  Initial hemoglobin was 5 thought to be secondary to acute GI bleeding.  While in the ER, he unfortunately had a large aspiration event and had multiple VT/VF cardiac arrest requiring approximately 10 minutes of ACLS.   Presenting hemoglobin 5.4, now up to 10.8 after several transfusions.  Anuric.  Creatinine 2.25 -> 3.8. Lactic acidosis >9. pH <6.95, now up to 7.25. On bicarb gtt, amidoarone, NE, vaso. Troponin 1300 -> 3600 -> 7100 -> >10000.   Past Medical History: CAD s/p CABG ICM Parkinson  Past Surgical History: CABG 2015  Allergies:    Allergies[1]  Social History:   Social History   Socioeconomic History   Marital status: Married    Spouse name: Not on file   Number of children: Not on file   Years of education: Not on file   Highest education level: Not on file  Occupational History   Not on file  Tobacco Use   Smoking status: Not on file   Smokeless tobacco: Not on file  Substance and Sexual Activity   Alcohol use: Not on file   Drug use: Not on file   Sexual activity: Not on file  Other Topics Concern   Not on file  Social History Narrative   Not on file   Social Drivers of Health   Tobacco Use: Not on file  Financial Resource Strain: Not on file  Food Insecurity: Patient Unable To Answer (07/26/2024)   Epic    Worried About Programme Researcher, Broadcasting/film/video in the Last Year: Patient unable to answer     Ran Out of Food in the Last Year: Patient unable to answer  Transportation Needs: Not on file  Physical Activity: Not on file  Stress: Not on file  Social Connections: Unknown (07/26/2024)   Social Connection and Isolation Panel    Frequency of Communication with Friends and Family: Not on file    Frequency of Social Gatherings with Friends and Family: Patient unable to answer    Attends Religious Services: Not on file    Active Member of Clubs or Organizations: Not on file    Attends Banker Meetings: Not on file    Marital Status: Not on file  Intimate Partner Violence: Not on file  Depression (PHQ2-9): Not on file  Alcohol Screen: Not on file  Housing: Not on file  Utilities: Not on file  Health Literacy: Not on file     Family History:   No family history on file.   ROS:  All other ROS reviewed and negative. Pertinent positives noted in the HPI.     Physical Exam/Data:   Vitals:   07/26/24 0530 07/26/24 0545 07/26/24 0600 07/26/24 0615  BP: 107/68 (!) 70/29 101/62 (!) 103/45  Pulse: 93 88 93 91  Resp: (!) 23 (!) 22 (!) 24 (!) 24  Temp: 100 F (37.8 C) 99.9 F (37.7 C) 99.9 F (37.7 C) 99.7 F (37.6 C)  TempSrc:  SpO2: 94% 95% 90% (!) 88%  Weight:      Height:        Intake/Output Summary (Last 24 hours) at 07/26/2024 0841 Last data filed at 07/26/2024 0600 Gross per 24 hour  Intake 5516.79 ml  Output --  Net 5516.79 ml       07/26/2024    5:00 AM 07/25/2024   10:20 PM 07/25/2024   11:18 AM  Last 3 Weights  Weight (lbs) 218 lb 7.6 oz 210 lb 12.2 oz 213 lb 3 oz  Weight (kg) 99.1 kg 95.6 kg 96.7 kg    Body mass index is 29.63 kg/m.  General: intubated and sedated  Neck: JVP to mid-neck at 45 degrees Cardiac: Normal S1, S2; RRR; 2/6 systolic flow murmur Lungs: Rales  Ext: No edema, pulses 2+ Skin: Warm and dry Neuro: Alert and oriented to person, place, time, and situation Psych: Normal mood and affect   EKG:  The EKG was personally  reviewed and demonstrates:  sinus tachycardia, NSVT, ST depressions throughout anterolateral and inferior leads with elevation in aVR Telemetry:  Telemetry was personally reviewed and demonstrates:  sinus tachycardia  Relevant CV Studies: -TTE 12/2023 LVEF 50 to 55%, grade 1 diastolic dysfunction, normal RV size and function, mild to moderate AR -TTE 02/2022 LVEF 50%, grade 2 diastolic dysfunction, mild AR -Monitor 01/2022 mean heart rate 60 bpm, 2 episodes of NSVT (longest 15 seconds), 12 episodes SVT (nonsustained) -CABG 03/2014 (LIMA-LAD, VGF-OM, VG-PDA, VG-diagonal)  Assessment and Plan:  Hemorrhagic shock secondary to GI bleeding Lactic acidosis NSTEMI VT/VF arrest 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 Unfortunately had VT/VF arrest while in the ED.  Troponin above assay.  Course complicated by anuric renal failure.  On DAPT as outpatient. Wide pulse pressure today, warm on exam.  ECG appears globally ischemic.  Seems most consistent with profound demand ischemia due to hemorrhagic shock given underlying severe CAD status post CABG.  Echo pending.  He is not a candidate for catheterization given recent GI bleeding and anuric renal failure.  Guarded prognosis.  Plan: - Transfuse for hemoglobin greater than 8 - Continue to hold DAPT for now - Follow up echocardiogram - Okay to continue amiodarone  gtt for now; can transition to PO 400mg  every day once a 5-10g load has been achieved - If he survives, neurologic status is appropriate, renal function improves, and GI bleeding source is found/treated, we can consider doing a catheterization at that time.  However, his history of CABG would make identifying culprit vessel/PCI somewhat challenging. Further, do worry about tolerance of DAPT going forward anyways since that is a potential cause of his GI bleed; thus cardiac catheterization may not change  management in the immediate term.     For questions or updates, please contact [facility] Please consult [email or web address] for contact info under    Time Spent with Patient: I have spent a total of 67 minutes caring for this patient today face to face, ordering and reviewing labs/tests, reviewing prior records/medical history, examining the patient, establishing an assessment and plan, communicating results/findings to the patient/family, and documenting in the medical record.   Signed, Caron Poser, MD  [place]  [facility]  07/26/2024 8:41 AM     [1] Not on File  "

## 2024-08-11 DEATH — deceased

## 2024-08-14 LAB — BLOOD GAS, ARTERIAL
Acid-Base Excess: 49.4 mmol/L — ABNORMAL HIGH (ref 0.0–2.0)
Bicarbonate: 82.5 mmol/L — ABNORMAL HIGH (ref 20.0–28.0)
FIO2: 100 %
MECHVT: 550 mL
Mechanical Rate: 20
PEEP: 10 cmH2O
Patient temperature: 37
pH, Arterial: 7.52 — ABNORMAL HIGH (ref 7.35–7.45)
pO2, Arterial: 31 mmHg — CL (ref 83–108)

## 2024-08-20 ENCOUNTER — Ambulatory Visit: Payer: Medicare Other | Admitting: Urology
